# Patient Record
Sex: Female | Born: 1955 | Race: White | Hispanic: No | Marital: Married | Smoking: Current every day smoker
Health system: Southern US, Community
[De-identification: ages and names within clinical notes are randomized; demographics above are authoritative.]

## PROBLEM LIST (undated history)

## (undated) DIAGNOSIS — Z923 Personal history of irradiation: Secondary | ICD-10-CM

## (undated) DIAGNOSIS — Z808 Family history of malignant neoplasm of other organs or systems: Secondary | ICD-10-CM

## (undated) DIAGNOSIS — C801 Malignant (primary) neoplasm, unspecified: Secondary | ICD-10-CM

## (undated) DIAGNOSIS — I480 Paroxysmal atrial fibrillation: Secondary | ICD-10-CM

## (undated) DIAGNOSIS — M1990 Unspecified osteoarthritis, unspecified site: Secondary | ICD-10-CM

## (undated) DIAGNOSIS — C50919 Malignant neoplasm of unspecified site of unspecified female breast: Secondary | ICD-10-CM

## (undated) DIAGNOSIS — Z801 Family history of malignant neoplasm of trachea, bronchus and lung: Secondary | ICD-10-CM

## (undated) DIAGNOSIS — I10 Essential (primary) hypertension: Secondary | ICD-10-CM

## (undated) DIAGNOSIS — Z87442 Personal history of urinary calculi: Secondary | ICD-10-CM

## (undated) DIAGNOSIS — Z803 Family history of malignant neoplasm of breast: Secondary | ICD-10-CM

## (undated) HISTORY — DX: Family history of malignant neoplasm of other organs or systems: Z80.8

## (undated) HISTORY — PX: COLONOSCOPY: SHX174

## (undated) HISTORY — PX: BREAST EXCISIONAL BIOPSY: SUR124

## (undated) HISTORY — DX: Family history of malignant neoplasm of trachea, bronchus and lung: Z80.1

## (undated) HISTORY — PX: BREAST BIOPSY: SHX20

## (undated) HISTORY — PX: TOE SURGERY: SHX1073

## (undated) HISTORY — PX: HEMORRHOID SURGERY: SHX153

## (undated) HISTORY — DX: Family history of malignant neoplasm of breast: Z80.3

---

## 1998-02-06 ENCOUNTER — Inpatient Hospital Stay (HOSPITAL_COMMUNITY): Admission: AD | Admit: 1998-02-06 | Discharge: 1998-02-08 | Payer: Self-pay | Admitting: Obstetrics and Gynecology

## 1998-02-10 ENCOUNTER — Encounter: Admission: RE | Admit: 1998-02-10 | Discharge: 1998-05-11 | Payer: Self-pay | Admitting: Obstetrics and Gynecology

## 2001-04-25 ENCOUNTER — Other Ambulatory Visit: Admission: RE | Admit: 2001-04-25 | Discharge: 2001-04-25 | Payer: Self-pay | Admitting: Obstetrics and Gynecology

## 2001-11-08 ENCOUNTER — Ambulatory Visit (HOSPITAL_COMMUNITY): Admission: RE | Admit: 2001-11-08 | Discharge: 2001-11-08 | Payer: Self-pay | Admitting: General Surgery

## 2001-11-08 ENCOUNTER — Encounter: Payer: Self-pay | Admitting: General Surgery

## 2001-11-08 ENCOUNTER — Encounter (INDEPENDENT_AMBULATORY_CARE_PROVIDER_SITE_OTHER): Payer: Self-pay | Admitting: Specialist

## 2002-05-29 ENCOUNTER — Other Ambulatory Visit: Admission: RE | Admit: 2002-05-29 | Discharge: 2002-05-29 | Payer: Self-pay | Admitting: Obstetrics and Gynecology

## 2002-09-13 ENCOUNTER — Encounter: Payer: Self-pay | Admitting: Obstetrics and Gynecology

## 2002-09-13 ENCOUNTER — Encounter: Admission: RE | Admit: 2002-09-13 | Discharge: 2002-09-13 | Payer: Self-pay | Admitting: Obstetrics and Gynecology

## 2002-11-01 ENCOUNTER — Encounter (INDEPENDENT_AMBULATORY_CARE_PROVIDER_SITE_OTHER): Payer: Self-pay | Admitting: Specialist

## 2002-11-01 ENCOUNTER — Ambulatory Visit (HOSPITAL_COMMUNITY): Admission: RE | Admit: 2002-11-01 | Discharge: 2002-11-01 | Payer: Self-pay | Admitting: Obstetrics and Gynecology

## 2003-06-11 ENCOUNTER — Other Ambulatory Visit: Admission: RE | Admit: 2003-06-11 | Discharge: 2003-06-11 | Payer: Self-pay | Admitting: Obstetrics and Gynecology

## 2004-02-27 ENCOUNTER — Encounter: Admission: RE | Admit: 2004-02-27 | Discharge: 2004-02-27 | Payer: Self-pay | Admitting: Obstetrics and Gynecology

## 2004-07-19 ENCOUNTER — Other Ambulatory Visit: Admission: RE | Admit: 2004-07-19 | Discharge: 2004-07-19 | Payer: Self-pay | Admitting: Obstetrics and Gynecology

## 2004-10-26 ENCOUNTER — Ambulatory Visit: Payer: Self-pay | Admitting: Internal Medicine

## 2005-01-31 ENCOUNTER — Ambulatory Visit: Payer: Self-pay | Admitting: Internal Medicine

## 2005-03-25 ENCOUNTER — Encounter: Admission: RE | Admit: 2005-03-25 | Discharge: 2005-03-25 | Payer: Self-pay | Admitting: Obstetrics and Gynecology

## 2005-08-08 ENCOUNTER — Ambulatory Visit: Payer: Self-pay | Admitting: Internal Medicine

## 2005-11-04 ENCOUNTER — Ambulatory Visit: Payer: Self-pay | Admitting: Internal Medicine

## 2006-04-05 ENCOUNTER — Encounter: Admission: RE | Admit: 2006-04-05 | Discharge: 2006-04-05 | Payer: Self-pay | Admitting: Obstetrics and Gynecology

## 2006-07-11 ENCOUNTER — Inpatient Hospital Stay (HOSPITAL_COMMUNITY): Admission: EM | Admit: 2006-07-11 | Discharge: 2006-07-14 | Payer: Self-pay | Admitting: Emergency Medicine

## 2006-08-15 ENCOUNTER — Other Ambulatory Visit: Admission: RE | Admit: 2006-08-15 | Discharge: 2006-08-15 | Payer: Self-pay | Admitting: Obstetrics and Gynecology

## 2008-06-13 ENCOUNTER — Encounter: Admission: RE | Admit: 2008-06-13 | Discharge: 2008-06-13 | Payer: Self-pay | Admitting: Obstetrics and Gynecology

## 2009-09-18 ENCOUNTER — Encounter: Admission: RE | Admit: 2009-09-18 | Discharge: 2009-09-18 | Payer: Self-pay | Admitting: Obstetrics and Gynecology

## 2010-09-24 ENCOUNTER — Encounter: Admission: RE | Admit: 2010-09-24 | Discharge: 2010-09-24 | Payer: Self-pay | Admitting: Obstetrics and Gynecology

## 2011-05-13 NOTE — Op Note (Signed)
[facility]  Patient:    Caroline [name], Caroline [name] Visit Number: [id number] MRN: [id number]          Service Type: DSU Location: DAY Attending Physician:  Carson Myrtle Dictated by:   Sheppard Plumber Earlene Plater, M.D. Proc. Date: 11/08/01 Admit Date:  11/08/2001                             Operative Report  PREOPERATIVE DIAGNOSIS:  External hemorrhoids.  POSTOPERATIVE DIAGNOSIS:  External hemorrhoids.  OPERATION:  Complete external hemorrhoidectomy.  SURGEON:  Timothy E. Earlene Plater, M.D.  ANESTHESIA:  Local standby.  HISTORY:  Mrs. Civil has no GI problems, otherwise healthy but very large external hemorrhoids and because of the pain, irritation and occasional bleeding she wishes to have them removed.  This has been carefully explained.  DESCRIPTION OF PROCEDURE:  The patient was brought to the operating room and placed supine.  Monitored anesthesia was initiated.  She was placed in lithotomy.  Anal area inspected and prepped and draped in the usual fashion. Marcaine 0.25% with epinephrine mixed 9:1 with Wydase was injected around and about the anal orifice and massaged in well.  The four hemorrhoids were noted, right anterior, right posterior, left lateral and a small left anterior.  Each of these was individually excised.  Care taken to preserve normal skin. Underlying varicosities removed and then each separately closed with interrupted 3-0 chromic sutures.  The end results were very satisfactory.  The sphincters of course were intact and the internal anal orifice was completely normal.  Gelfoam gauze and a dry sterile dressing applied.  She tolerated it well and was awakened and taken to the recovery room in good condition.  Written and verbal instructions were given including Percocet and she will be seen and followed as an outpatient. Dictated by:   Sheppard Plumber Earlene Plater, M.D. Attending Physician:  Carson Myrtle DD:  11/08/01 TD:  11/08/01 Job:  [id number]

## 2011-05-13 NOTE — Op Note (Signed)
NAME:  Caroline [name], Caroline [name]                        ACCOUNT NO.:  [id number]   MEDICAL RECORD NO.:  [id number]                   PATIENT TYPE:  AMB   LOCATION:  SDC                                  FACILITY:  [facility]   PHYSICIAN:  Hal Morales, M.D.             DATE OF BIRTH:  20-Dec-1956   DATE OF PROCEDURE:  11/01/2002  DATE OF DISCHARGE:                                 OPERATIVE REPORT   PREOPERATIVE DIAGNOSES:  1. Pelvic pain.  2. Dysmenorrhea.   POSTOPERATIVE DIAGNOSES:  1. Pelvic pain.  2. Dysmenorrhea.  3. Lysis of adhesions.  4. Rule out endometriosis.   OPERATIONS:  1. Operative laparoscopy.  2. Lysis of adhesions.  3. Peritoneal and uterine biopsies.   ANESTHESIA:  General orotracheal.   ESTIMATED BLOOD LOSS:  Less than 10 cc.   COMPLICATIONS:  None.   FINDINGS:  The uterus was normal sized with several blotchy areas that were  whitened along the serosa of the fundus.  The left fallopian tube and left  ovary were within normal limits without adhesions or stigmata of  endometriosis.  The right fallopian tube contained some filmy adhesions to  the right ovary.  No clear stigmata of endometriosis could be appreciated.  There was a single adhesions between the omentum and the anterior abdominal  wall just above the symphysis pubis.  This was deviated to the left side.  There was a single pinpoint area of bluish tint that could be consistent  with a powder-burn lesion of endometriosis in the posterior cul-de-sac near  the right uterosacral ligament.  The appendix appeared normal.   PROCEDURE:  The patient was taken to the operating room after appropriate  identification and placed on the operating table.  After the attainment of  adequate general anesthesia, she was placed in the modified lithotomy  position.  The abdomen, perineum, and vagina were prepped with multiple  layers of Betadine.  A Foley catheter was inserted into the bladder and  connected to straight  drainage.  A Hulka tenaculum was placed on the  anterior cervix.  The abdomen was draped as a sterile field.  Subumbilical  and suprapubic injections of 0.25% Marcaine for a total of 10 cc was  undertaken.  A subumbilical incision was made and the Veress cannula placed  through that incision into the peritoneal cavity.  A pneumoperitoneum was  created with 3.5 liters of CO2.  The Veress cannula was removed and the  laparoscopic trocar placed through the subumbilical incision into the  peritoneal cavity.  The laparoscope was placed through the trocar sleeve and  the above-noted findings made and documented.  Lysis of adhesions was  carried out around the right tube and ovary to free that ovary and free the  fimbriated end of the tube.  Biopsies were obtained from the right  uterosacral ligament lesion and from two of the lesions on the uterine  fundus, all  sent to rule out endometriosis.  Lysis of adhesions was carried  out along the anterior abdominal wall with a combination of bipolar cautery  and sharp dissection.  Copious irrigation was carried out and hemostasis  noted to be adequate after cauterization of the biopsy site on the uterus.  Approximately 100 cc of warm lactated Ringers' was left in the peritoneal  cavity and all instruments were removed from the peritoneal cavity under  direct visualization as the CO2 was allowed to escape.  The subumbilical and  suprapubic incisions were closed with subcuticular sutures of 3-0 Vicryl and  sterile dressings applied.  The Hulka tenaculum and the Foley were removed  and the patient awakened from general anesthesia and taken to the recovery  room in satisfactory condition having tolerated the procedure well with  sponge and instrument counts correct.  Specimens to pathology, peritoneal  biopsy and uterine serosal biopsy.                                                Hal Morales, M.D.    VPH/MEDQ  D:  11/01/2002  T:  11/02/2002   Job:  [id number]

## 2011-05-13 NOTE — H&P (Signed)
[name], [name] NO.:  [id number]   MEDICAL RECORD NO.:  [id number]          PATIENT TYPE:  INP   LOCATION:  0104                         FACILITY:  [facility]   PHYSICIAN:  Lonia Blood, M.D.       DATE OF BIRTH:  23-Apr-1956   DATE OF ADMISSION:  07/11/2006  DATE OF DISCHARGE:                                HISTORY & PHYSICAL   PRIMARY CARE PHYSICIAN:  At the [facility].   CHIEF COMPLAINT:  Headache and neck pain.   HISTORY OF PRESENT ILLNESS:  .  Caroline [name] is a 55 year old woman without any significant past medical  history, who presented to [facility] Emergency Room with complaints of  headache, fever and neck pain.  The patient noticed that she woke up this  morning and she felt kind of lousy with significant headache and neck  stiffness.  The symptoms got worse during the day; she went to an urgent  care center and she was referred to the emergency room for possible  meningitis.  The patient reports that 24 hours prior to admission she felt  great.  She has no history of nausea, vomiting, diarrhea or any sick  contacts at home.   PAST MEDICAL HISTORY:  1.  The patient underwent 3 prior cesarean sections, in 1994, 1997 and 1999.  2.  History of antiphospholipid antibody positive.  3.  History of left breast nodule.  4.  History of herpes simplex virus II.  5.  History of  postpartum depression.  6.  History of intrauterine fetal demise at term.   MEDICATIONS:  None.   SOCIAL HISTORY:  The patient smokes a pack of cigarettes a day.  She is  married.  She does not drink alcohol.  She works as a Best boy at  [employer].  The patient has 2 living children.   ALLERGIES:  The patient is allergic to SULFA and NONSTEROIDAL ANTI-  INFLAMMATORY DRUGS.   FAMILY HISTORY:  Positive for mother, who died of emphysema.  She has an  aunt that had breast cancer and a cousin that had breast cancer.   REVIEW OF SYSTEMS:  As per  HPI.  Also, the patient reports a rash on the  right buttock.   PHYSICAL EXAMINATION:  VITAL SIGNS:  Temperature of 101.4, blood pressure  148/97, pulse 90, respirations 20, saturation 98% on room air.  GENERAL APPEARANCE:  A well-developed, well-nourished woman in no acute  distress, sitting on the stretcher, alert and oriented to place, person and  time.  HEENT:  Head is normocephalic, atraumatic.  Eyes:  The pupils are equal,  round and reactive to light and accommodation.  Extraocular movements  intact.  CHEST:  Clear to auscultation bilaterally without wheezes, rhonchi or  crackles.  HEART:  Regular rate and rhythm without murmurs, rubs, or gallops.  ABDOMEN:  Soft, nontender and non-distended.  Bowel sounds are present.  EXTREMITIES:  Lower extremities have no edema.  SKIN:  Warm and dry.  There is a tiny rash on the right buttock.  NECK:  The patient has very  slight nuchal rigidity.  NEUROLOGICAL:  Cranial nerves III-XII are intact.  Strength is 5/5 in all 4  extremities.  Sensation is intact.   LABORATORY VALUES:  At time of admission, white blood cell count is 9000,  hemoglobin 14.3, platelet count 200,000; sodium 134, potassium 3.9, chloride  100, bicarb 27, BUN 15, creatinine 0.7, calcium 8.8; urinalysis within  normal limits.  Cerebrospinal fluid shows 150 white blood cells, predominant  lymphocytes, 89%, with a glucose level in the CSF of 55 and a protein level  in the CSF of 96.   ASSESSMENT AND PLAN:  Meningitis, most likely aseptic.  We will admit the  patient to the hospital for further testing and observation.  The patient  will have her cerebrospinal fluid sent for herpes simplex.  She will have  antibodies for [facility] spotted fever tested and she will have the  cerebrospinal fluid sent for culture.  The patient  will be placed on  antibiotics including vancomycin, Rocephin, doxycycline and acyclovir.  If  the cerebrospinal fluid culture is negative for 24  hours, we will  discontinue the Rocephin and vancomycin.  Also, if the rash on the skin does  not expand, we will discontinue the doxycycline.  We will also obtain an  Infectious Disease consultation in the morning.      Lonia Blood, M.D.  Electronically Signed     SL/MEDQ  D:  07/11/2006  T:  07/11/2006  Job:  [id number]

## 2011-05-13 NOTE — Discharge Summary (Signed)
NAMEJEIRY, BIRNBAUM NO.:  [id number]   MEDICAL RECORD NO.:  [id number]          PATIENT TYPE:  INP   LOCATION:  1618                         FAC[facility]   PHYSICIAN:  Elliot Cousin, M.D.    DATE OF BIRTH:  06/25/1956   DATE OF ADMISSION:  07/11/2006  DATE OF DISCHARGE:  07/14/2006                                 DISCHARGE SUMMARY   DISCHARGE DIAGNOSES:  1.  Herpes simplex meningitis.  2.  History of genital herpes.  3.  Significant nausea and vomiting during the hospital course secondary to      multiple medications.  4.  Tobacco abuse.   For secondary discharge diagnoses, please see the dictated history and  physical.   DISCHARGE MEDICATIONS:  1.  Vicodin 5 mg 1-2 tablets every 4 hours as needed for pain.  2.  Valtrex 500 mg 2 tablets t.i.d. for 7 more days.  3.  Phenergan 25 mg every 6 hours as needed for nausea.  4.  Advil 400 mg every 4 hours as needed and as directed.  5.  Tylenol take as directed and as needed.   DISCHARGE DISPOSITION:  The patient was discharged to home in improved and  stable condition on July20,2007.  She was advised to follow up with her  primary care physician at [facility] in 5-7 days.  The patient  was strongly advised to come back to the hospital if her symptoms worsen.   PROCEDURE PERFORMED:  Lumbar puncture (prior to official hospital admission)  .   HISTORY OF PRESENT ILLNESS:  The patient is a 55 year old lady with a past  medical history significant for genital herpes,  antiphospholipid antibody  positive, and postpartum depression , who presented to the emergency  department on July16,2007 with a chief complaint of headache, fever, and  neck pain.  The patient went to a local urgent care center for initial  evaluation.  She was advised to go to the emergency department at [facility] for evaluation of possible meningitis.  When the patient  presented to the emergency, she was febrile  with a temperature of 101.4. A  lumbar puncture was performed during the evaluation in the emergency  department.  The results were consistent with a viral or aseptic meningitis.  The patient was subsequently admitted for further evaluation and management.   HOSPITAL COURSE.:  #1.  HERPES SIMPLEX MENINGITIS. As stated above, the  patient was febrile with a temperature of 101.4.  She was hemodynamically  stable.  Her white blood cell count was within normal limits at 9.2.  The  results of the lumbar puncture revealed a total WBC count of 150 and RBC  count of 19.  There were 5% segmented neutrophils, 89% lymphocytes, and 5%  monocytes.  The Gram's stain revealed moderate WBCs , but no organism seen.  Blood cultures were ordered.  The patient was started on empiric treatment  with acyclovir, Rocephin, vancomycin, and doxycycline, all intravenously.  The patient did have a small rash on her right buttock that upon  reevaluation, appeared to be an excoriated pimple.  Doxycycline however was  started empirically.   The patient reported a history of genital herpes and therefore herpes  simplex meningitis was a concern. Intravenous acyclovir was started.  Over  the course of the hospitalization, the cerebrospinal fluid culture remained  negative.  On the day of anticipated hospital discharge, the CSF PCR became  positive for herpes simplex.  The patient did receive intravenous acyclovir  for 1 day.  However, it was discontinued afterwards because of severe nausea  and vomiting.   Given the findings of the positive herpes simplex, I requested a curbside  consult with Dr. Cliffton Asters, infectious diseases specialist.  The  question was whether or not the patient needed to stay in the hospital for  further treatment with intravenous acyclovir.  Dr. Orvan Falconer was informed  that the patient wanted to go home, her headache had subsided, and she  demonstrated no signs or symptoms consistent with  encephalitis.  Given this  information, Dr. Orvan Falconer recommended discharging the patient on high dose  Valtrex for 5-10 more days.  Therefore the patient was discharged to home on  Valtrex 1000 mg t.i.d. for 7 more days.  She was also sent home on Phenergan  as needed for nausea and Vicodin as needed for pain.  The patient's nausea  and vomiting completely resolved during the hospital course.  Her headache  subsided substantially although she still had residual frontal symptoms. The  photophobia subsided as well.   Of note, several other studies were ordered for further evaluation.  The  results are as follows: HIV antibody was nonreactive, cryptococcal antigen  was negative, and the [facility] spotted fever titer was negative as  well.  The patient's blood cultures also remained negative during the  hospitalization.   #2.  HEADACHE . The patients headache subsided, but did not completely  resolve during the hospitalization.  The headache was exacerbated by  caffeine withdrawal and medications.  Once the adjustments in medications  were made, the patients headache subsided.  Of note, she was given a small  amount of caffeinated coffee towards the end of the hospitalization which  appeared to have helped.   #3.  TOBACCO ABUSE. The patient was advised to stop smoking.  She was  treated with a nicotine patch 21 mg daily.      Elliot Cousin, M.D.  Electronically Signed     DF/MEDQ  D:  07/17/2006  T:  07/18/2006  Job:  [id number]

## 2011-05-13 NOTE — H&P (Signed)
NAME:  Caroline [name], Caroline [name] NO.:  [id number]   MEDICAL RECORD NO.:  [id number]                   PATIENT TYPE:  AMB   LOCATION:  SDC                                  FACILITY:  [facility]   PHYSICIAN:  Hal Morales, M.D.             DATE OF BIRTH:  20-Nov-1956   DATE OF ADMISSION:  DATE OF DISCHARGE:                                HISTORY & PHYSICAL   HISTORY OF PRESENT ILLNESS:  The patient is a 55 year old, married, white  female, para 3-0-0-2, who presents for a diagnostic laparoscopy due to  severe dysmenorrhea.  The patient states that for the past year she has  experienced severe menstrual cramping which began one day prior to her  menstrual period and lasts at least two days into her four-day menstrual  flow.  The patient rates her pain as a 9/10 and though she has tried  nonsteroidal anti-inflammatory medications, they cause such severe GI upset  she is unable to tolerate them.  As a result, the patient has to take two  Vicodin to decrease her pain to 3/10 on a 10 point scale.  The patient's  flow is such that it requires her to change a super tampon plus a pad every  one-and-a-half hours for the first two days of her menstrual flow.  The  remainder of her menses is characterized by simply wearing a regular tampon  which she changes every five to six hours.  She admits to diarrhea with her  menses, but denies any nausea, vomiting, fever, or dyspareunia.   PAST OBSTETRICAL HISTORY:  Gravida 3, para 3-0-0-2.  The patient underwent  cesarean sections in 1994, 1997, and 1999.  She experienced postpartum  depression and intrauterine fetal demise at term.   PAST GYNECOLOGICAL HISTORY:  Menarche at 55 years old.  The patient's  menstrual periods are regular, every 25 days.  She uses vasectomy as her  method of contraception.  She has a remote history of chlamydia, as well as  abnormal Pap smear.  She had a normal DEXA scan in May of 2002, a normal Pap  smear in June of 2003, and a normal mammogram in September of 2003.   PAST MEDICAL HISTORY:  Positive for:  1. Herpes simplex virus #2.  2. Left breast nodule (benign).  3. Thrombocytopenia (postpartum).  4. Anticardiolipin antibody positive.   PAST SURGICAL HISTORY:  Positive for:  1. Cesarean sections as previously mentioned.  2. Lasik surgery of her eyes in 2000.  3. Hemorrhoidectomy in 2001.   She denies any problems with anesthesia or history of blood transfusions.   FAMILY HISTORY:  Positive for stroke, emphysema, lung cancer, and breast  cancer.   SOCIAL HISTORY:  The patient is married.  She works at [employer].   CURRENT MEDICATIONS:  Vicodin.   ALLERGIES:  SULFA which causes swelling and NONSTEROIDAL ANTI-INFLAMMATORY  MEDICATIONS which cause severe gastric distress.  HABITS:  The patient does smoke a half of a packs of cigarettes per day.  She consumes alcohol at least twice weekly.   REVIEW OF SYMPTOMS:  Positive for knee arthralgias, however, otherwise  negative, except as mentioned in the history of present illness.   PHYSICAL EXAMINATION:  VITAL SIGNS:  The blood pressure is 100/80.  WEIGHT:  154 pounds.  HEIGHT:  5 feet 4-1/8 inches tall.  NECK:  Supple without masses.  There is no adenopathy or thyromegaly.  HEART:  Regular rate and rhythm.  There is no murmur.  LUNGS:  Clear to auscultation.  There are no wheezing, rhonchi, or rales.  BACK:  Without CVA tenderness.  ABDOMEN:  Bowel sounds are present.  It is soft without tenderness,  guarding, or organomegaly.  There are no palpable masses.  EXTREMITIES:  No cyanosis, clubbing, or edema.  PELVIC:  EG and BUS within normal limits.  The vagina is rugous.  The cervix  is nontender without lesions.  The uterus appears normal size, shape, and  consistency without tenderness.  Adnexa without tenderness or masses.  Rectovaginal without tenderness or masses.   IMPRESSION:  Severe dysmenorrhea.    DISPOSITION:  The patient understands the implications for her laparoscopic  procedure along with the risks associated with it to include, but are not  limited to reaction to anesthesia, damage to adjacent organs, infections,  and bleeding.  The patient has consented to undergo a diagnostic laparoscopy  at [facility] on November 01, 2002, at 11:45 a.m.     [place] J. Adline Peals.                    Hal Morales, M.D.    EJP/MEDQ  D:  10/23/2002  T:  10/23/2002  Job:  [id number]

## 2011-09-01 ENCOUNTER — Other Ambulatory Visit: Payer: Self-pay | Admitting: Obstetrics and Gynecology

## 2011-09-01 DIAGNOSIS — Z1231 Encounter for screening mammogram for malignant neoplasm of breast: Secondary | ICD-10-CM

## 2011-10-10 ENCOUNTER — Ambulatory Visit: Payer: Self-pay

## 2011-10-28 ENCOUNTER — Ambulatory Visit: Payer: Self-pay

## 2011-11-25 ENCOUNTER — Ambulatory Visit
Admission: RE | Admit: 2011-11-25 | Discharge: 2011-11-25 | Disposition: A | Discharge status: Home / Self Care | Payer: BC Managed Care – PPO | Source: Ambulatory Visit | Attending: Obstetrics and Gynecology | Admitting: Obstetrics and Gynecology

## 2011-11-25 DIAGNOSIS — Z1231 Encounter for screening mammogram for malignant neoplasm of breast: Secondary | ICD-10-CM

## 2011-11-26 ENCOUNTER — Inpatient Hospital Stay (HOSPITAL_COMMUNITY)
Admission: EM | Admit: 2011-11-26 | Discharge: 2011-11-27 | DRG: 143 | Disposition: A | Discharge status: Home / Self Care | Payer: BC Managed Care – PPO | Attending: Family Medicine | Admitting: Family Medicine

## 2011-11-26 ENCOUNTER — Emergency Department (HOSPITAL_COMMUNITY): Payer: BC Managed Care – PPO

## 2011-11-26 ENCOUNTER — Encounter: Payer: Self-pay | Admitting: Nurse Practitioner

## 2011-11-26 DIAGNOSIS — R079 Chest pain, unspecified: Principal | ICD-10-CM | POA: Diagnosis present

## 2011-11-26 DIAGNOSIS — Z720 Tobacco use: Secondary | ICD-10-CM

## 2011-11-26 DIAGNOSIS — I10 Essential (primary) hypertension: Secondary | ICD-10-CM

## 2011-11-26 HISTORY — DX: Essential (primary) hypertension: I10

## 2011-11-26 LAB — CBC
HCT: 39.9 % (ref 36.0–46.0)
HCT: 39.7 % (ref 36.0–46.0)
Hemoglobin: 14.1 g/dL (ref 12.0–15.0)
Hemoglobin: 13.8 g/dL (ref 12.0–15.0)
MCH: 32.9 pg (ref 26.0–34.0)
MCHC: 35.3 g/dL (ref 30.0–36.0)
MCHC: 34.8 g/dL (ref 30.0–36.0)
MCV: 93.2 fL (ref 78.0–100.0)
Platelets: 160 10*3/uL (ref 150–400)
RBC: 4.28 MIL/uL (ref 3.87–5.11)
RBC: 4.21 MIL/uL (ref 3.87–5.11)
RDW: 13.5 % (ref 11.5–15.5)
WBC: 6.5 10*3/uL (ref 4.0–10.5)
WBC: 6.3 10*3/uL (ref 4.0–10.5)

## 2011-11-26 LAB — BASIC METABOLIC PANEL
BUN: 17 mg/dL (ref 6–23)
CO2: 27 mEq/L (ref 19–32)
Calcium: 9.1 mg/dL (ref 8.4–10.5)
Chloride: 101 mEq/L (ref 96–112)
Creatinine, Ser: 0.63 mg/dL (ref 0.50–1.10)
GFR calc Af Amer: >90 mL/min (ref >90)
GFR calc non Af Amer: >90 mL/min (ref >90)
Glucose, Bld: 102 mg/dL — ABNORMAL HIGH (ref 70–99)
Potassium: 4 mEq/L (ref 3.5–5.1)
Sodium: 140 mEq/L (ref 135–145)

## 2011-11-26 LAB — CREATININE, SERUM
Creatinine, Ser: 0.75 mg/dL (ref 0.50–1.10)
GFR calc Af Amer: >90 mL/min (ref >90)
GFR calc non Af Amer: >90 mL/min (ref >90)

## 2011-11-26 LAB — CARDIAC PANEL(CRET KIN+CKTOT+MB+TROPI)
CK, MB: 1.9 ng/mL (ref 0.3–4.0)
Relative Index: INVALID (ref 0.0–2.5)
Troponin I: <0.3 ng/mL (ref <0.30)

## 2011-11-26 LAB — TROPONIN I: Troponin I: <0.3 ng/mL (ref <0.30)

## 2011-11-26 MED ORDER — ZOLPIDEM TARTRATE 5 MG PO TABS
5.0000 mg | ORAL_TABLET | Freq: Every evening | ORAL | Status: DC | PRN
  Administered 2011-11-26: 5 mg via ORAL
  Filled 2011-11-26: qty 1

## 2011-11-26 MED ORDER — MORPHINE SULFATE 4 MG/ML IJ SOLN
6.0000 mg | Freq: Once | INTRAMUSCULAR | Status: AC
  Administered 2011-11-26: 6 mg via INTRAVENOUS
  Filled 2011-11-26: qty 2

## 2011-11-26 MED ORDER — METOPROLOL TARTRATE 12.5 MG HALF TABLET
12.5000 mg | ORAL_TABLET | Freq: Two times a day (BID) | ORAL | Status: DC
  Administered 2011-11-26 – 2011-11-27 (×2): 12.5 mg via ORAL
  Filled 2011-11-26 (×3): qty 1

## 2011-11-26 MED ORDER — TRAMADOL HCL 50 MG PO TABS
50.0000 mg | ORAL_TABLET | Freq: Four times a day (QID) | ORAL | Status: DC | PRN
  Administered 2011-11-26: 50 mg via ORAL
  Filled 2011-11-26: qty 1

## 2011-11-26 MED ORDER — ACETAMINOPHEN 650 MG RE SUPP: 650.0000 mg | Freq: Four times a day (QID) | RECTAL | Status: DC | PRN

## 2011-11-26 MED ORDER — ONDANSETRON HCL 4 MG/2ML IJ SOLN
4.0000 mg | Freq: Once | INTRAMUSCULAR | Status: AC
  Administered 2011-11-26: 4 mg via INTRAVENOUS
  Filled 2011-11-26: qty 2

## 2011-11-26 MED ORDER — ONDANSETRON HCL 4 MG/2ML IJ SOLN: 4.0000 mg | Freq: Three times a day (TID) | INTRAMUSCULAR | Status: AC | PRN

## 2011-11-26 MED ORDER — ACETAMINOPHEN 325 MG PO TABS
650.0000 mg | ORAL_TABLET | Freq: Once | ORAL | Status: AC
  Administered 2011-11-26: 650 mg via ORAL
  Filled 2011-11-26: qty 1

## 2011-11-26 MED ORDER — ACETAMINOPHEN 325 MG PO TABS: 650.0000 mg | ORAL_TABLET | Freq: Four times a day (QID) | ORAL | Status: DC | PRN

## 2011-11-26 MED ORDER — ASPIRIN 81 MG PO CHEW: 324.0000 mg | CHEWABLE_TABLET | Freq: Once | ORAL | Status: DC

## 2011-11-26 MED ORDER — LISINOPRIL 20 MG PO TABS
20.0000 mg | ORAL_TABLET | Freq: Every day | ORAL | Status: DC
  Administered 2011-11-26: 20 mg via ORAL
  Filled 2011-11-26 (×2): qty 1

## 2011-11-26 MED ORDER — NICOTINE 14 MG/24HR TD PT24
14.0000 mg | MEDICATED_PATCH | Freq: Every day | TRANSDERMAL | Status: DC
  Administered 2011-11-26 – 2011-11-27 (×2): 14 mg via TRANSDERMAL
  Filled 2011-11-26 (×3): qty 1

## 2011-11-26 MED ORDER — PNEUMOCOCCAL VAC POLYVALENT 25 MCG/0.5ML IJ INJ
0.5000 mL | INJECTION | INTRAMUSCULAR | Status: AC
  Administered 2011-11-27: 0.5 mL via INTRAMUSCULAR
  Filled 2011-11-26: qty 0.5

## 2011-11-26 MED ORDER — IBUPROFEN 200 MG PO TABS: 400.0000 mg | ORAL_TABLET | Freq: Once | ORAL | Status: DC

## 2011-11-26 MED ORDER — ENOXAPARIN SODIUM 40 MG/0.4ML ~~LOC~~ SOLN
40.0000 mg | SUBCUTANEOUS | Status: DC
  Administered 2011-11-26: 40 mg via SUBCUTANEOUS
  Filled 2011-11-26 (×2): qty 0.4

## 2011-11-26 MED ORDER — ASPIRIN 81 MG PO CHEW
81.0000 mg | CHEWABLE_TABLET | Freq: Every day | ORAL | Status: DC
  Administered 2011-11-27: 81 mg via ORAL
  Filled 2011-11-26 (×2): qty 1

## 2011-11-26 MED ORDER — SODIUM CHLORIDE 0.9 % IV SOLN: INTRAVENOUS | Status: DC

## 2011-11-26 NOTE — ED Notes (Signed)
Called floor to give report, nurse currently unavailable, awaiting return call. Will attempt again in if no call.

## 2011-11-26 NOTE — ED Provider Notes (Cosign Needed)
History    55yf with CP. Episode yesterday which resolved. Similar one again today. Chest pressure with radiation to L arm. Associated with nausea. No fever or chills. No cp or sob. Denies leg pain or swelling. Denies hx of blood clot. No rash.  CSN: [id number] Arrival date & time: 11/26/2011 10:51 AM   First MD Initiated Contact with Patient 11/26/11 1054      Chief Complaint  Patient presents with  . Chest Pain    (Consider location/radiation/quality/duration/timing/severity/associated sxs/prior treatment) HPI  Past Medical History  Diagnosis Date  . Hypertension     History reviewed. No pertinent past surgical history.  History reviewed. No pertinent family history.  History  Substance Use Topics  . Smoking status: Current Everyday Smoker -- 1.0 packs/day  . Smokeless tobacco: Not on file  . Alcohol Use: Not on file    OB History    Grav Para Term Preterm Abortions TAB SAB Ect Mult Living                  Review of Systems   Review of symptoms negative unless otherwise noted in HPI.   Allergies  Review of patient's allergies indicates no known allergies.  Home Medications   Current Outpatient Rx  Name Route Sig Dispense Refill  . LISINOPRIL 20 MG PO TABS Oral Take 20 mg by mouth at bedtime.        BP 151/95  Pulse 86  Temp(Src) 98.2 F (36.8 C) (Oral)  Resp 20  Ht 5\' 4"  (1.626 m)  Wt 155 lb (70.308 kg)  BMI 26.61 kg/m2  SpO2 98%  Physical Exam  Nursing note and vitals reviewed. Constitutional: She appears well-developed and well-nourished. No distress.  HENT:  Head: Normocephalic and atraumatic.  Eyes: Conjunctivae are normal. Right eye exhibits no discharge. Left eye exhibits no discharge.  Neck: Neck supple.  Cardiovascular: Normal rate, regular rhythm and normal heart sounds.  Exam reveals no gallop and no friction rub.   No murmur heard. Pulmonary/Chest: Effort normal and breath sounds normal. No respiratory distress.  Abdominal: Soft.  She exhibits no distension. There is no tenderness.       Cp not reproducible  Musculoskeletal: She exhibits no edema and no tenderness.  Neurological: She is alert.  Skin: Skin is warm and dry.  Psychiatric: She has a normal mood and affect. Her behavior is normal. Thought content normal.    ED Course  Procedures (including critical care time)  Labs Reviewed  BASIC METABOLIC PANEL - Abnormal; Notable for the following:    Glucose, Bld 102 (*)    All other components within normal limits  COMPREHENSIVE METABOLIC PANEL - Abnormal; Notable for the following:    Total Protein 5.9 (*)    Albumin 3.0 (*)    All other components within normal limits  LIPID PANEL - Abnormal; Notable for the following:    Cholesterol 201 (*)    All other components within normal limits  CBC  TROPONIN I  CBC  CREATININE, SERUM  MAGNESIUM  PHOSPHORUS  CARDIAC PANEL(CRET KIN+CKTOT+MB+TROPI)  CARDIAC PANEL(CRET KIN+CKTOT+MB+TROPI)  CARDIAC PANEL(CRET KIN+CKTOT+MB+TROPI)  LAB REPORT - SCANNED   No results found.   1. Chest pain   2. Tobacco abuse   3. HTN (hypertension)     ED ECG REPORT   Date: 11/26/2011  EKG Time: 11:16 AM  Rate: 79  Rhythm: normal sinus rhythm  Axis: normal  Intervals:none  ST&T Change: NS ST changes  Narrative Interpretation: poor r wave  progression and NS ST flattening anteriorly.             MDM  55yF with CP. Initial EKG non diagnostic but with some concerning changes of twave flattening anteriorly and poor r wave progression. Unfortunately no comparison EKG. Do not feel pt is low risk. EKG changes, concerning story, risk factors of HTN, age and smoking. Admit for r/o and further eval.        Raeford Razor, MD 11/30/11 [phone]

## 2011-11-26 NOTE — ED Notes (Addendum)
Called the floor to give report and spoke with Pattricia Boss, Charity fundraiser.  Pt prepared for transport to the floor.  A/O x3, NAD.  Pt was medicated per order for headache.

## 2011-11-26 NOTE — ED Notes (Signed)
Per ems: pt called for [place], dizziness, numbness to L arm onset this am. Similar symptoms yesterday that resolved on their own. En route pt began to c/o chest pressure. ST on monitor. ems gave 3 SL nitro, 4mg  IVP zofran en route, relieved nausea for a brief time, pt initial bp decreased to 124/88 after nitro but pt now c/o headache.

## 2011-11-26 NOTE — ED Notes (Signed)
Patient reporting pain medication has worked and headache has decreased. Primary RN aware.

## 2011-11-26 NOTE — ED Notes (Signed)
Pt took 325 mg asa at home prior to arrival

## 2011-11-26 NOTE — H&P (Signed)
Caroline [name] is an 55 y.o. female.   Chief Complaint:  Chest pain on and off since yesterday HPI:  55 year old female, history of hypertension and ongoing tobacco abuse, presented to the ED for some onset chest pain once she was sitting on her computer, pain is tightness radiated to her left arm associated with numbness, and diaphoresis patient admitted yesterday had a similar episode, respond to aspirin and nitroglycerin, but currently she complain of headache from using the nitroglycerin. Patient admitted she had a similar episode on the past but never been investigated bed patient was seen in the emergency room her blood pressure systolic was191/64, patient denies any shortness of breath and denies any lower extremity swelling   Past Medical History  Diagnosis Date  . Hypertension     Bus surgical history : C-section   family history : COPD and stroke denies any history of heart attack in the family  Social History: She is married, has 2 grown children, she smoke secondary to one pack per day for more than 30 years She drinks alcohol occasionally, denies any illicit drug abuse  Allergies: No Known Allergies  Medications Prior to Admission  Medication Dose Route Frequency Provider Last Rate Last Dose  . acetaminophen (TYLENOL) tablet 650 mg  650 mg Oral Once Raeford Razor, MD   650 mg at 11/26/11 1559  . morphine 4 MG/ML injection 6 mg  6 mg Intravenous Once Raeford Razor, MD   6 mg at 11/26/11 1158  . ondansetron (ZOFRAN) injection 4 mg  4 mg Intravenous Once Raeford Razor, MD   4 mg at 11/26/11 1158  . DISCONTD: aspirin chewable tablet 324 mg  324 mg Oral Once Raeford Razor, MD      . DISCONTD: ibuprofen (ADVIL,MOTRIN) tablet 400 mg  400 mg Oral Once Raeford Razor, MD       No current outpatient prescriptions on file as of 11/26/2011.    Results for orders placed during the hospital encounter of 11/26/11 (from the past 48 hour(s))  CBC     Status: Normal   Collection Time   11/26/11 11:05 AM      Component Value Range Comment   WBC 6.5  4.0 - 10.5 (K/uL)    RBC 4.28  3.87 - 5.11 (MIL/uL)    Hemoglobin 14.1  12.0 - 15.0 (g/dL)    HCT 32.4  40.1 - 02.7 (%)    MCV 93.2  78.0 - 100.0 (fL)    MCH 32.9  26.0 - 34.0 (pg)    MCHC 35.3  30.0 - 36.0 (g/dL)    RDW 25.3  66.4 - 40.3 (%)    Platelets 160  150 - 400 (K/uL)   BASIC METABOLIC PANEL     Status: Abnormal   Collection Time   11/26/11 11:05 AM      Component Value Range Comment   Sodium 140  135 - 145 (mEq/L)    Potassium 4.0  3.5 - 5.1 (mEq/L)    Chloride 101  96 - 112 (mEq/L)    CO2 27  19 - 32 (mEq/L)    Glucose, Bld 102 (*) 70 - 99 (mg/dL)    BUN 17  6 - 23 (mg/dL)    Creatinine, Ser 4.74  0.50 - 1.10 (mg/dL)    Calcium 9.1  8.4 - 10.5 (mg/dL)    GFR calc non Af Amer >90  >90 (mL/min)    GFR calc Af Amer >90  >90 (mL/min)   TROPONIN I  Status: Normal   Collection Time   11/26/11 11:09 AM      Component Value Range Comment   Troponin I <0.30  <0.30 (ng/mL)    Dg Chest 2 View  11/26/2011  *RADIOLOGY REPORT*  Clinical Data: Chest pain  CHEST - 2 VIEW  Comparison: None.  Findings: The cardiac silhouette, mediastinum, pulmonary vasculature are within normal limits.  Both lungs are clear. There is no acute bony abnormality.  IMPRESSION: There is no evidence of acute cardiac or pulmonary process.  Original Report Authenticated By: Brandon Melnick, M.D.    Review of Systems  Constitutional: Positive for diaphoresis. Negative for fever, chills, weight loss and malaise/fatigue.  HENT: Negative for hearing loss, ear pain, nosebleeds, congestion, sore throat, neck pain, tinnitus and ear discharge.   Eyes: Negative for blurred vision, double vision, pain, discharge and redness.  Respiratory: Positive for sputum production. Negative for cough, hemoptysis and stridor.   Cardiovascular: Positive for chest pain and palpitations. Negative for orthopnea, claudication, leg swelling and PND.  Gastrointestinal:  Positive for nausea. Negative for heartburn, abdominal pain, diarrhea, constipation and blood in stool.  Genitourinary: Negative for dysuria, urgency, frequency and hematuria.  Musculoskeletal: Negative for myalgias, back pain and joint pain.  Skin: Negative for itching and rash.  Neurological: Negative for dizziness, tingling, sensory change, weakness and headaches.  Endo/Heme/Allergies: Negative for environmental allergies. Does not bruise/bleed easily.    Blood pressure 124/86, pulse 77, temperature 98.2 F (36.8 C), temperature source Oral, resp. rate 20, height 5\' 4"  (1.626 m), weight 71.5 kg (157 lb 10.1 oz), SpO2 93.00%. Physical Exam  Constitutional: She is oriented to person, place, and time. She appears well-developed and well-nourished. No distress.  HENT:  Head: Normocephalic and atraumatic.  Right Ear: External ear normal.  Left Ear: External ear normal.  Mouth/Throat: Oropharynx is clear and moist. No oropharyngeal exudate.  Eyes: Conjunctivae are normal. Pupils are equal, round, and reactive to light. Right eye exhibits no discharge. Left eye exhibits discharge.  Neck: Neck supple. No JVD present. No thyromegaly present.  Cardiovascular: Normal rate, normal heart sounds and intact distal pulses.  Exam reveals no gallop and no friction rub.   No murmur heard. Respiratory: No stridor. No respiratory distress. She has no wheezes. She has no rales. She exhibits no tenderness.  GI: She exhibits mass. She exhibits no distension. There is no tenderness. There is no rebound and no guarding.  Musculoskeletal: She exhibits no edema and no tenderness.  Lymphadenopathy:    She has no cervical adenopathy.  Neurological: She is alert and oriented to person, place, and time. She has normal reflexes. No cranial nerve deficit. Coordination normal.  Skin: Skin is warm. No rash noted. She is not diaphoretic. No erythema. No pallor.     Assessment/Plan 32-53 year old female presented with  chest pain, and un controlled hypertension. History of recurrent chest pain never been investigated, EKG so just felt to T-wave at V1 and V4 and diminished R-wave V3 . The patient would be admitted to telemetry, cycle cardiac enzymes, 2-D echo, aspirin 81 mg daily, metoprolol d, further investigation with a stress test or cardiology consultation in-house versus outpatient stress test as the patient felt pain free at this minute and patient would like to be discharged.lipid profile 2-hypertension the patient blood pressures was elevated I would continue lisinoprilr and add beta blocker  3- smoking counselling. Dinisha Cai I. 11/26/2011, 5:18 PM

## 2011-11-26 NOTE — ED Notes (Signed)
Pt report received and care assumed from [place], [place]. Pt sitting up in the bed watching TV.  NAD, pt denies CP/SOB and N/V.  Pt states "I just wanna go home." Pt does c/o headache returning at 6/10 as a result of NTG administration. Physician notified, new order received.

## 2011-11-27 LAB — COMPREHENSIVE METABOLIC PANEL
ALT: 16 U/L (ref 0–35)
AST: 17 U/L (ref 0–37)
Albumin: 3 g/dL — ABNORMAL LOW (ref 3.5–5.2)
Alkaline Phosphatase: 67 U/L (ref 39–117)
Chloride: 100 mEq/L (ref 96–112)
Potassium: 3.7 mEq/L (ref 3.5–5.1)
Sodium: 136 mEq/L (ref 135–145)
Total Bilirubin: 0.5 mg/dL (ref 0.3–1.2)
Total Protein: 5.9 g/dL — ABNORMAL LOW (ref 6.0–8.3)

## 2011-11-27 LAB — CARDIAC PANEL(CRET KIN+CKTOT+MB+TROPI)
CK, MB: 2.1 ng/mL (ref 0.3–4.0)
CK, MB: 2 ng/mL (ref 0.3–4.0)
Troponin I: <0.3 ng/mL (ref <0.30)
Troponin I: <0.3 ng/mL (ref <0.30)

## 2011-11-27 LAB — LIPID PANEL
Cholesterol: 201 mg/dL — ABNORMAL HIGH (ref 0–200)
HDL: 84 mg/dL (ref >39)
LDL Cholesterol: 98 mg/dL (ref 0–99)
Triglycerides: 97 mg/dL (ref <150)
VLDL: 19 mg/dL (ref 0–40)

## 2011-11-27 MED ORDER — NICOTINE 14 MG/24HR TD PT24: 1.0000 | MEDICATED_PATCH | TRANSDERMAL | Status: AC

## 2011-11-27 MED ORDER — ASPIRIN 81 MG PO CHEW: 81.0000 mg | CHEWABLE_TABLET | Freq: Every day | ORAL | Status: AC

## 2011-11-27 MED ORDER — LISINOPRIL 40 MG PO TABS: 40.0000 mg | ORAL_TABLET | Freq: Every day | ORAL | Status: DC

## 2011-11-27 NOTE — Progress Notes (Signed)
CSW attempted to visit with pt to discuss advance directives. CSW was informed that the pt had discharged.  Golden Pop 11/27/2011 3:38 PM

## 2011-11-27 NOTE — Discharge Summary (Signed)
Physician Discharge Summary  Caroline [name] ZOX:[id number] DOB: 1956-02-25 DOA: 11/26/2011  PCP: Eartha Inch, MD  Admit date: 11/26/2011 Discharge date: 11/27/2011  Discharge Diagnoses:  1. Chest pain 2. Uncontrolled hypertension  Discharge Condition: Improved  Disposition: Home  History of present illness:  55 year old woman who presented via EMS with complaint of nausea, dizziness, chest pressure and left arm numbness. Chest pressure began while she was working at her computer. Was noted be hypertensive in the emergency department. She was admitted for chest pain evaluation.  Hospital Course:  1. Chest pain: Resolved. Etiology most likely hypertension. She also has chronic neck problems which may have contributed to her left arm paresthesias. Her symptoms are completely resolved and she desires discharge home. She does not want any further cardiac evaluation.  2. Uncontrolled hypertension: Much improved. Will increase her lisinopril. She may need a second agent. I stressed that she needs to followup with her primary care physician soon.   Certainly she has recurrent symptoms she should seek immediate evaluation. However I doubt coronary disease at this point. She understands that coronary disease however cannot be excluded without further testing which she wishes to defer consideration of packed this at this time.  Consultants:  None.  Procedures:  None.  Discharge Instructions  Discharge Orders    Future Orders Please Complete By Expires   Diet - low sodium heart healthy      Discharge instructions      Comments:   Call physician for chest pain or shortness of breath received immediate evaluation at emergency department.   Activity as tolerated - No restrictions        Current Discharge Medication List    START taking these medications   Details  aspirin 81 MG chewable tablet Chew 1 tablet (81 mg total) by mouth daily.    nicotine (NICODERM CQ - DOSED IN MG/24  HOURS) 14 mg/24hr patch Place 1 patch onto the skin daily.      CONTINUE these medications which have CHANGED   Details  lisinopril (PRINIVIL,ZESTRIL) 40 MG tablet Take 1 tablet (40 mg total) by mouth at bedtime. Qty: 30 tablet, Refills: 0       Follow-up Information    Follow up with BADGER,MICHAEL C in 1 week.   Contact information:   [address] [place] [address] [phone]           The results of significant diagnostics from this hospitalization (including imaging, microbiology, ancillary and laboratory) are listed below for reference.    Significant Diagnostic Studies: EKG 11/26/2011 at 11:06 AM: Independently reviewed: Sinus rhythm. Nonspecific T-wave changes. No acute changes.  Dg Chest 2 View  11/26/2011  *RADIOLOGY REPORT*  Clinical Data: Chest pain  CHEST - 2 VIEW  Comparison: None.  Findings: The cardiac silhouette, mediastinum, pulmonary vasculature are within normal limits.  Both lungs are clear. There is no acute bony abnormality.  IMPRESSION: There is no evidence of acute cardiac or pulmonary process.  Original Report Authenticated By: Brandon Melnick, M.D.   Labs: Basic Metabolic Panel:  Lab 11/27/11 0865 11/26/11 1800 11/26/11 1105  NA 136 -- 140  K 3.7 -- 4.0  CL 100 -- 101  CO2 30 -- 27  GLUCOSE 95 -- 102*  BUN 19 -- 17  CREATININE 0.71 0.75 0.63  CALCIUM 8.4 -- 9.1  MG -- 2.0 --  PHOS 4.5 -- --   Liver Function Tests:  Lab 11/27/11 0500  AST 17  ALT 16  ALKPHOS 67  BILITOT 0.5  PROT 5.9*  ALBUMIN 3.0*   CBC:  Lab 11/26/11 1800 11/26/11 1105  WBC 6.3 6.5  NEUTROABS -- --  HGB 13.8 14.1  HCT 39.7 39.9  MCV 94.3 93.2  PLT 161 160   Cardiac Enzymes:  Lab 11/27/11 0904 11/27/11 0028 11/26/11 1800 11/26/11 1109  CKTOTAL 46 51 54 --  CKMB 2.0 2.1 1.9 --  CKMBINDEX -- -- -- --  TROPONINI <0.30 <0.30 <0.30 <0.30   Time coordinating discharge: 25 minutes.  Signed:  Brendia Sacks, MD  [facility] 11/27/2011, 2:39 PM

## 2011-11-27 NOTE — Progress Notes (Signed)
PROGRESS NOTE  Caroline [name] UJW:[id number] DOB: December 19, 1956 DOA: 11/26/2011 PCP: Provider Not In System  Brief narrative: 55 year old woman who presented via EMS with complaint of nausea, dizziness, chest pressure and left arm numbness. Chest pressure began while she was working at her computer. Was noted be hypertensive in the emergency department. She was admitted for chest pain evaluation.  Past medical history: Hypertension, smoker  Consultants:  None.  Procedures:  None.  Interim History: None of note. Subjective: Feels much better. No chest pain or shortness of breath. She thinks her symptoms were related to her high blood pressure.  Objective:  Filed Vitals:   11/26/11 1600 11/26/11 1651 11/26/11 2057 11/27/11 0540  BP: 163/66 124/86 116/78 106/73  Pulse:  77 63 67  Temp:  98.2 F (36.8 C) 98.5 F (36.9 C) 98.5 F (36.9 C)  TempSrc:  Oral  Oral  Resp: 18 20 18 18   Height:  5\' 4"  (1.626 m)    Weight:  71.5 kg (157 lb 10.1 oz)  71.532 kg (157 lb 11.2 oz)  SpO2:  93% 97% 97%     Intake/Output Summary (Last 24 hours) at 11/27/11 1415 Last data filed at 11/27/11 0830  Gross per 24 hour  Intake    720 ml  Output    800 ml  Net    -80 ml    Exam: General: Appears well. Cardiovascular: Regular rate and rhythm. No murmur, rub or gallop. Normal respiratory effort. Respiratory: Clear to auscultation bilaterally. No wheezes, rales or rhonchi. Normal respiratory effort.  Data Reviewed: Basic Metabolic Panel:  Lab 11/27/11 5621 11/26/11 1800 11/26/11 1105  NA 136 -- 140  K 3.7 -- 4.0  CL 100 -- 101  CO2 30 -- 27  GLUCOSE 95 -- 102*  BUN 19 -- 17  CREATININE 0.71 0.75 0.63  CALCIUM 8.4 -- 9.1  MG -- 2.0 --  PHOS 4.5 -- --   Liver Function Tests:  Lab 11/27/11 0500  AST 17  ALT 16  ALKPHOS 67  BILITOT 0.5  PROT 5.9*  ALBUMIN 3.0*   CBC:  Lab 11/26/11 1800 11/26/11 1105  WBC 6.3 6.5  NEUTROABS -- --  HGB 13.8 14.1  HCT 39.7 39.9  MCV 94.3  93.2  PLT 161 160   Cardiac Enzymes:  Lab 11/27/11 0904 11/27/11 0028 11/26/11 1800 11/26/11 1109  CKTOTAL 46 51 54 --  CKMB 2.0 2.1 1.9 --  CKMBINDEX -- -- -- --  TROPONINI <0.30 <0.30 <0.30 <0.30   Studies: Dg Chest 2 View  11/26/2011  *RADIOLOGY REPORT*  Clinical Data: Chest pain  CHEST - 2 VIEW  Comparison: None.  Findings: The cardiac silhouette, mediastinum, pulmonary vasculature are within normal limits.  Both lungs are clear. There is no acute bony abnormality.  IMPRESSION: There is no evidence of acute cardiac or pulmonary process.  Original Report Authenticated By: Brandon Melnick, M.D.    Scheduled Meds:   . sodium chloride   Intravenous STAT  . acetaminophen  650 mg Oral Once  . aspirin  81 mg Oral Daily  . enoxaparin  40 mg Subcutaneous Q24H  . lisinopril  20 mg Oral QHS  . metoprolol tartrate  12.5 mg Oral BID  . nicotine  14 mg Transdermal Daily  . pneumococcal 23 valent vaccine  0.5 mL Intramuscular Tomorrow-1000  . DISCONTD: ibuprofen  400 mg Oral Once   Continuous Infusions:    Assessment/Plan: 1. Chest pain: Resolved. Etiology most likely hypertension. She also has chronic  neck problems which may have contributed to her left arm paresthesias. Her symptoms are completely resolved and she desires discharge home. She does not want any further cardiac evaluation. 2. Uncontrolled hypertension: Much improved. Will increase her lisinopril. She may need a second agent. I stressed that she needs to followup with her primary care physician soon.   Certainly she has recurrent symptoms she should seek immediate evaluation. However I doubt coronary disease at this point. She understands that coronary disease however cannot be excluded without further testing she wishes to defer consideration of this at this time.   Brendia Sacks, MD  [facility] Pager [phone] 11/27/2011, 2:15 PM    LOS: 1 day

## 2012-01-10 ENCOUNTER — Other Ambulatory Visit (HOSPITAL_COMMUNITY): Payer: Self-pay | Admitting: Family Medicine

## 2012-05-07 ENCOUNTER — Other Ambulatory Visit: Payer: Self-pay | Admitting: Family Medicine

## 2012-05-07 DIAGNOSIS — R1013 Epigastric pain: Secondary | ICD-10-CM

## 2012-05-11 ENCOUNTER — Other Ambulatory Visit: Payer: BC Managed Care – PPO

## 2012-10-24 ENCOUNTER — Other Ambulatory Visit: Payer: Self-pay | Admitting: Obstetrics and Gynecology

## 2012-10-24 DIAGNOSIS — Z1231 Encounter for screening mammogram for malignant neoplasm of breast: Secondary | ICD-10-CM

## 2012-11-30 ENCOUNTER — Ambulatory Visit: Payer: BC Managed Care – PPO

## 2013-01-03 ENCOUNTER — Encounter: Payer: Self-pay | Admitting: Obstetrics and Gynecology

## 2013-01-03 ENCOUNTER — Ambulatory Visit (INDEPENDENT_AMBULATORY_CARE_PROVIDER_SITE_OTHER): Payer: 59 | Admitting: Obstetrics and Gynecology

## 2013-01-03 VITALS — BP 100/60 | HR 106 | Ht 63.5 in | Wt 157.0 lb

## 2013-01-03 DIAGNOSIS — Z01419 Encounter for gynecological examination (general) (routine) without abnormal findings: Secondary | ICD-10-CM

## 2013-01-03 DIAGNOSIS — E785 Hyperlipidemia, unspecified: Secondary | ICD-10-CM

## 2013-01-03 NOTE — Progress Notes (Signed)
Subjective:  Last Pap: 01/05/12 WNL: Yes  Due 2016 Regular Periods:no Contraception: post menopausal  Monthly Breast exam:no Tetanus<83yrs:no Nl.Bladder Function:yes Daily BMs:yes Healthy Diet:yes Calcium:no Mammogram:yes Date of Mammogram: 12/2011 Exercise:yes Have often Exercise: 3 times per week  Seatbelt: yes Abuse at home: no Stressful work:yes Sigmoid-colonoscopy: 10/2011 Bone Density: Yes 5 years ago  PCP: Dr. Cyndia Bent  Change in PMH: no changes Change in [facility]: no changes  Caroline [name] is a 57 y.o. female G3P2 who presents for annual exam.  She. She specifically denies hot flashes or vaginal dryness. She does have chronic sleep difficulties. Her main concerns are symptoms of muscle aches and a cough, which she thinks may be related to her medications for her lipids and blood pressure. She plans to visit her primary care physician for evaluation.  The following portions of the patient's history were reviewed and updated as appropriate: allergies, current medications, past family history, past medical history, past social history, past surgical history and problem list.  Review of Systems Constitutional: negative, new back and leg muscle pain Gastrointestinal:No change in bowel habits, no abdominal pain, no rectal bleeding Genitourinary:negative for dysuria, frequency, hematuria, nocturia and urinary incontinence    Objective:     BP 100/60  Pulse 106  Ht 5' 3.5" (1.613 m)  Wt 157 lb (71.215 kg)  BMI 27.38 kg/m2  LMP 11/25/2010  Weight:  Wt Readings from Last 1 Encounters:  01/03/13 157 lb (71.215 kg)     BMI: Body mass index is 27.38 kg/(m^2). General Appearance: Alert, appropriate appearance for age. No acute distress HEENT: Grossly normal Neck / Thyroid: Supple, no masses, nodes or enlargement Lungs: clear to auscultation bilaterally Back: No CVA tenderness Breast Exam: No masses or nodes.No dimpling, nipple retraction or discharge. Cardiovascular: Regular  rate and rhythm. S1, S2, no murmur Gastrointestinal: Soft, non-tender, no masses or organomegaly Pelvic Exam: Vulva and vagina appear normal. Bimanual exam reveals normal uterus and adnexa. Rectovaginal: normal rectal, no masses Lymphatic Exam: Non-palpable nodes in neck, clavicular, axillary, or inguinal regions Skin: no rash or abnormalities Neurologic: Normal gait and speech, no tremor  Psychiatric: Alert and oriented, appropriate affect.    Urinalysis:Not done    Assessment:    Menopause Myalgias and nonproductive cough, question related to medication    Plan:  Bone density mammogram pap smear due 2016 return annually or prn    Dierdre Forth MD

## 2013-01-10 ENCOUNTER — Other Ambulatory Visit: Payer: Self-pay

## 2013-01-11 ENCOUNTER — Other Ambulatory Visit: Payer: 59

## 2013-02-01 ENCOUNTER — Ambulatory Visit: Payer: BC Managed Care – PPO

## 2013-02-09 ENCOUNTER — Other Ambulatory Visit: Payer: Self-pay

## 2013-04-12 ENCOUNTER — Ambulatory Visit
Admission: RE | Admit: 2013-04-12 | Discharge: 2013-04-12 | Disposition: A | Discharge status: Home / Self Care | Payer: 59 | Source: Ambulatory Visit | Attending: Obstetrics and Gynecology | Admitting: Obstetrics and Gynecology

## 2013-04-12 DIAGNOSIS — Z1231 Encounter for screening mammogram for malignant neoplasm of breast: Secondary | ICD-10-CM

## 2013-10-31 ENCOUNTER — Other Ambulatory Visit: Payer: Self-pay

## 2014-03-20 ENCOUNTER — Other Ambulatory Visit: Payer: Self-pay

## 2014-03-20 DIAGNOSIS — Z1231 Encounter for screening mammogram for malignant neoplasm of breast: Secondary | ICD-10-CM

## 2014-04-18 ENCOUNTER — Ambulatory Visit: Payer: 59

## 2014-04-25 ENCOUNTER — Ambulatory Visit
Admission: RE | Admit: 2014-04-25 | Discharge: 2014-04-25 | Disposition: A | Discharge status: Home / Self Care | Payer: 59 | Source: Ambulatory Visit

## 2014-04-25 DIAGNOSIS — Z1231 Encounter for screening mammogram for malignant neoplasm of breast: Secondary | ICD-10-CM

## 2014-10-27 ENCOUNTER — Encounter: Payer: Self-pay | Admitting: Obstetrics and Gynecology

## 2015-04-07 ENCOUNTER — Other Ambulatory Visit: Payer: Self-pay

## 2015-04-07 DIAGNOSIS — Z1231 Encounter for screening mammogram for malignant neoplasm of breast: Secondary | ICD-10-CM

## 2015-05-08 ENCOUNTER — Ambulatory Visit: Payer: Self-pay

## 2015-05-19 ENCOUNTER — Ambulatory Visit: Payer: Self-pay

## 2015-06-19 ENCOUNTER — Ambulatory Visit
Admission: RE | Admit: 2015-06-19 | Discharge: 2015-06-19 | Disposition: A | Discharge status: Home / Self Care | Payer: 59 | Source: Ambulatory Visit

## 2015-06-19 DIAGNOSIS — Z1231 Encounter for screening mammogram for malignant neoplasm of breast: Secondary | ICD-10-CM

## 2016-05-18 ENCOUNTER — Other Ambulatory Visit: Payer: Self-pay

## 2016-05-18 DIAGNOSIS — Z1231 Encounter for screening mammogram for malignant neoplasm of breast: Secondary | ICD-10-CM

## 2016-06-24 ENCOUNTER — Ambulatory Visit: Payer: 59

## 2016-07-15 ENCOUNTER — Ambulatory Visit
Admission: RE | Admit: 2016-07-15 | Discharge: 2016-07-15 | Disposition: A | Discharge status: Home / Self Care | Payer: 59 | Source: Ambulatory Visit

## 2016-07-15 DIAGNOSIS — Z1231 Encounter for screening mammogram for malignant neoplasm of breast: Secondary | ICD-10-CM

## 2016-09-29 ENCOUNTER — Telehealth (HOSPITAL_COMMUNITY): Payer: Self-pay | Admitting: *Deleted

## 2016-09-29 ENCOUNTER — Emergency Department (HOSPITAL_COMMUNITY): Payer: 59

## 2016-09-29 ENCOUNTER — Encounter (HOSPITAL_COMMUNITY): Payer: Self-pay | Admitting: Emergency Medicine

## 2016-09-29 ENCOUNTER — Emergency Department (HOSPITAL_COMMUNITY)
Admission: EM | Admit: 2016-09-29 | Discharge: 2016-09-29 | Disposition: A | Discharge status: Home / Self Care | Payer: 59 | Attending: Emergency Medicine | Admitting: Emergency Medicine

## 2016-09-29 DIAGNOSIS — Z79899 Other long term (current) drug therapy: Secondary | ICD-10-CM | POA: Diagnosis not present

## 2016-09-29 DIAGNOSIS — I4891 Unspecified atrial fibrillation: Secondary | ICD-10-CM | POA: Diagnosis not present

## 2016-09-29 DIAGNOSIS — R002 Palpitations: Secondary | ICD-10-CM | POA: Diagnosis present

## 2016-09-29 DIAGNOSIS — F17210 Nicotine dependence, cigarettes, uncomplicated: Secondary | ICD-10-CM | POA: Diagnosis not present

## 2016-09-29 DIAGNOSIS — I10 Essential (primary) hypertension: Secondary | ICD-10-CM | POA: Diagnosis not present

## 2016-09-29 LAB — CBC
HCT: 41.4 % (ref 36.0–46.0)
Hemoglobin: 13.9 g/dL (ref 12.0–15.0)
MCH: 33 pg (ref 26.0–34.0)
MCHC: 33.6 g/dL (ref 30.0–36.0)
MCV: 98.3 fL (ref 78.0–100.0)
PLATELETS: 248 10*3/uL (ref 150–400)
RBC: 4.21 MIL/uL (ref 3.87–5.11)
RDW: 13.6 % (ref 11.5–15.5)
WBC: 10.3 10*3/uL (ref 4.0–10.5)

## 2016-09-29 LAB — BASIC METABOLIC PANEL
Anion gap: 17 — ABNORMAL HIGH (ref 5–15)
BUN: 11 mg/dL (ref 6–20)
CALCIUM: 8.9 mg/dL (ref 8.9–10.3)
CHLORIDE: 93 mmol/L — AB (ref 101–111)
CO2: 24 mmol/L (ref 22–32)
Creatinine, Ser: 0.6 mg/dL (ref 0.44–1.00)
GFR calc non Af Amer: >60 mL/min (ref >60)
Glucose, Bld: 105 mg/dL — ABNORMAL HIGH (ref 65–99)
POTASSIUM: 3.3 mmol/L — AB (ref 3.5–5.1)
Sodium: 134 mmol/L — ABNORMAL LOW (ref 135–145)

## 2016-09-29 LAB — I-STAT TROPONIN, ED: Troponin i, poc: 0 ng/mL (ref 0.00–0.08)

## 2016-09-29 MED ORDER — SODIUM CHLORIDE 0.9 % IV SOLN
INTRAVENOUS | Status: DC
  Administered 2016-09-29: 06:00:00 via INTRAVENOUS

## 2016-09-29 MED ORDER — METOPROLOL TARTRATE 5 MG/5ML IV SOLN
5.0000 mg | Freq: Once | INTRAVENOUS | Status: AC
  Administered 2016-09-29: 5 mg via INTRAVENOUS
  Filled 2016-09-29: qty 5

## 2016-09-29 MED ORDER — ETOMIDATE 2 MG/ML IV SOLN: 0.2000 mg/kg | Freq: Once | INTRAVENOUS | Status: DC

## 2016-09-29 MED ORDER — METOPROLOL SUCCINATE ER 25 MG PO TB24: 25.0000 mg | ORAL_TABLET | Freq: Every day | ORAL | 0 refills | Status: DC

## 2016-09-29 NOTE — ED Triage Notes (Signed)
Patient comes to ED with chest pain and mild shortness of breath that started 45 mins ago.  Patient states she is feeling extremely thirsty.  No nausea or vomiting with the chest pain. States she has had this before, but usually goes away.  This feeling is different to her.

## 2016-09-29 NOTE — Telephone Encounter (Signed)
Left message for pt to call to schedule ER follow up appt

## 2016-09-29 NOTE — ED Notes (Signed)
Portable chest xray in progress at this time.

## 2016-09-29 NOTE — ED Notes (Signed)
Dr Wentz at bedside at this time 

## 2016-09-29 NOTE — ED Notes (Signed)
Pt reports improvement of chest discomfort and sob. Repeat EKG completed and given to Dr.Wentz. HR 114 in ST

## 2016-09-29 NOTE — Discharge Instructions (Signed)
Get plenty of rest and drink a lot of fluids.  Avoid any products to stimulate your heart, such as caffeine.  The cardiology service, with [facility], will call you to schedule a follow-up appointment.  Return here, if needed, for problems.

## 2016-09-29 NOTE — ED Provider Notes (Addendum)
[facility] DEPT Provider Note   CSN: [id number] Arrival date & time: 09/29/16  0426     History   Chief Complaint Chief Complaint  Patient presents with  . Chest Pain  . Shortness of Breath    HPI Caroline [name] is a 60 y.o. female.  She presents for evaluation of palpitations, with sensation of rapid heartbeat, which awoke her from sleep, within the last hour. She denies chest pain, nausea, vomiting, diaphoresis, weakness or dizziness. History of the same, briefly, several times, and evaluated by cardiology about a year ago with a Holter monitor. She was told that she had a benign "normal" problem. She denies recent fever, chills, nausea, vomiting, cough, shortness of breath or chest pain. No other changes in health. There are no other known modifying factors.  HPI  Past Medical History:  Diagnosis Date  . Hypertension     Patient Active Problem List   Diagnosis Date Noted  . Hyperlipidemia 01/03/2013  . Chest pain 11/26/2011  . Tobacco abuse 11/26/2011  . HTN (hypertension) 11/26/2011    Past Surgical History:  Procedure Laterality Date  . CESAREAN SECTION  [phone]    OB History    Gravida Para Term Preterm AB Living   3 2       2    SAB TAB Ectopic Multiple Live Births                   Home Medications    Prior to Admission medications   Medication Sig Start Date End Date Taking? Authorizing Provider  buPROPion (WELLBUTRIN XL) 150 MG 24 hr tablet Take 150 mg by mouth daily. 03/29/16  Yes Historical Provider, MD  valsartan-hydrochlorothiazide (DIOVAN-HCT) 160-12.5 MG tablet Take 1 tablet by mouth daily. 09/15/16  Yes Historical Provider, MD  lisinopril (PRINIVIL,ZESTRIL) 40 MG tablet Take 1 tablet (40 mg total) by mouth at bedtime. Patient not taking: Reported on 09/29/2016 11/27/11   Samuella Cota, MD    Family History No family history on file.  Social History Social History  Substance Use Topics  . Smoking status: Current Every Day  Smoker    Packs/day: 1.00    Types: Cigarettes  . Smokeless tobacco: Never Used  . Alcohol use 4.2 oz/week    7 Glasses of wine per week     Allergies   Sulfa antibiotics   Review of Systems Review of Systems  All other systems reviewed and are negative.    Physical Exam Updated Vital Signs BP 131/95   Pulse 116   Temp 98.8 F (37.1 C) (Oral)   Resp 17   Ht 5\' 4"  (1.626 m)   Wt 160 lb (72.6 kg)   LMP 11/25/2010   SpO2 94%   BMI 27.46 kg/m   Physical Exam  Constitutional: She is oriented to person, place, and time. She appears well-developed and well-nourished.  HENT:  Head: Normocephalic and atraumatic.  Eyes: Conjunctivae and EOM are normal. Pupils are equal, round, and reactive to light.  Neck: Normal range of motion and phonation normal. Neck supple.  Cardiovascular: Exam reveals no friction rub.   No murmur heard. Irregular tachycardia  Pulmonary/Chest: Effort normal and breath sounds normal. She exhibits no tenderness.  Abdominal: Soft. She exhibits no distension. There is no tenderness. There is no guarding.  Musculoskeletal: Normal range of motion. She exhibits no edema or deformity.  Neurological: She is alert and oriented to person, place, and time. She exhibits normal muscle tone.  Skin: Skin is warm  and dry.  Psychiatric: She has a normal mood and affect. Her behavior is normal. Judgment and thought content normal.  Nursing note and vitals reviewed.    ED Treatments / Results  Labs (all labs ordered are listed, but only abnormal results are displayed) Labs Reviewed  BASIC METABOLIC PANEL - Abnormal; Notable for the following:       Result Value   Sodium 134 (*)    Potassium 3.3 (*)    Chloride 93 (*)    Glucose, Bld 105 (*)    Anion gap 17 (*)    All other components within normal limits  CBC  I-STAT TROPOININ, ED    EKG  EKG Interpretation  Date/Time:  Thursday September 29 2016 04:32:46 EDT Ventricular Rate:  178 PR Interval:    QRS  Duration: 66 QT Interval:  248 QTC Calculation: 426 R Axis:   88 Text Interpretation:  Atrial fibrillation with rapid ventricular response Nonspecific ST and T wave abnormality Abnormal ECG Since last tracing new atrial fibrillation with RVR Confirmed by Eulis Foster  MD, Sybella Harnish [phone]) on 09/29/2016 4:57:24 AM       EKG Interpretation  Date/Time:  Thursday September 29 2016 06:05:06 EDT Ventricular Rate:  116 PR Interval:    QRS Duration: 72 QT Interval:  328 QTC Calculation: 456 R Axis:   98 Text Interpretation:  Sinus tachycardia Borderline right axis deviation Borderline T abnormalities, anterior leads Since last tracing of earlier today converted now to sinus tachycardia Confirmed by Eulis Foster  MD, Nickola Lenig [phone]) on 09/29/2016 6:16:07 AM          Radiology Dg Chest Portable 1 View  Result Date: 09/29/2016 CLINICAL DATA:  Left-sided chest pain tonight EXAM: PORTABLE CHEST 1 VIEW COMPARISON:  11/26/2011 FINDINGS: The heart size and mediastinal contours are within normal limits. Both lungs are clear. The visualized skeletal structures are unremarkable. IMPRESSION: No active disease. Electronically Signed   By: Lucienne Capers M.D.   On: 09/29/2016 05:11    Procedures Procedures (including critical care time)  Medications Ordered in ED Medications  etomidate (AMIDATE) injection 14.52 mg (not administered)  0.9 %  sodium chloride infusion (not administered)  metoprolol (LOPRESSOR) injection 5 mg (not administered)     Initial Impression / Assessment and Plan / ED Course  I have reviewed the triage vital signs and the nursing notes.  Pertinent labs & imaging results that were available during my care of the patient were reviewed by me and considered in my medical decision making (see chart for details).  Clinical Course    Medications  0.9 %  sodium chloride infusion ( Intravenous New Bag/Given 09/29/16 0622)  metoprolol (LOPRESSOR) injection 5 mg (5 mg Intravenous Given 09/29/16  0622)    Patient Vitals for the past 24 hrs:  BP Temp Temp src Pulse Resp SpO2 Height Weight  09/29/16 0700 109/78 - - 103 17 95 % - -  09/29/16 0630 106/85 - - 95 15 91 % - -  09/29/16 0615 111/90 - - 111 12 92 % - -  09/29/16 0600 131/95 - - 116 17 94 % - -  09/29/16 0545 126/100 - - (!) 133 15 95 % - -  09/29/16 0437 122/87 98.8 F (37.1 C) Oral (!) 180 17 - 5\' 4"  (1.626 m) 160 lb (72.6 kg)    7:14 AM Reevaluation with update and discussion. After initial assessment and treatment, an updated evaluation reveals She is now comfortable, denies chest pain, palpitations, weakness, dizziness. Heart rate  now 100-101, sinus. Patient and husband informed of findings. Answered all questions. Samayah Novinger L   07:15- Page requested to Cardiology, to arrange follow up. Call returned at 8:15- they will call patient to arrange follow-up. The patient's phone number is [phone]  CHA2DS2/VAS Stroke Risk Points      2 >= 2 Points: High Risk  1 - 1.99 Points: Medium Risk  0 Points: Low Risk    The patient's score has not changed in the past year.:  No Change         Details    Note: External data might be a factor in metrics not marked with    Points Metrics   This score determines the patient's risk of having a stroke if the  patient has atrial fibrillation.       0 Has Congestive Heart Failure:  No   0 Has Vascular Disease:  No   1 Has Hypertension:  Yes   0 Age:  89   0 Has Diabetes:  No   0 Had Stroke:  No Had TIA:  No Had thromboembolism:  No   1 Female:  Yes         CRITICAL CARE Performed by: Daleen Bo L Total critical care time: 45 minutes Critical care time was exclusive of separately billable procedures and treating other patients. Critical care was necessary to treat or prevent imminent or life-threatening deterioration. Critical care was time spent personally by me on the following activities: development of treatment plan with patient and/or surrogate as well as  nursing, discussions with consultants, evaluation of patient's response to treatment, examination of patient, obtaining history from patient or surrogate, ordering and performing treatments and interventions, ordering and review of laboratory studies, ordering and review of radiographic studies, pulse oximetry and re-evaluation of patient's condition.   Final Clinical Impressions(s) / ED Diagnoses   Final diagnoses:  Atrial fibrillation with RVR (HCC)   New-onset atrial fibrillation, with rapid ventricular rate, spontaneously converted after IV fluids. Patient maintained sinus rhythm, top normal rate. No evidence for metabolic instability, ACS, PE or serious bacterial infection.  Nursing Notes Reviewed/ Care Coordinated Applicable Imaging Reviewed Interpretation of Laboratory Data incorporated into ED treatment  The patient appears reasonably screened and/or stabilized for discharge and I doubt any other medical condition or other [facility] requiring further screening, evaluation, or treatment in the ED at this time prior to discharge.  Plan: Home Medications- continue; Home Treatments- rest, fluids, avoid caffeine; return here if the recommended treatment, does not improve the symptoms; Recommended follow up- PCP prn. Cardiology 1 week.     New Prescriptions New Prescriptions   No medications on file     Daleen Bo, MD 09/29/16 [place], MD 10/04/16 1158

## 2016-09-30 ENCOUNTER — Telehealth (HOSPITAL_COMMUNITY): Payer: Self-pay | Admitting: *Deleted

## 2016-09-30 NOTE — Telephone Encounter (Signed)
Patient called in this morning stating she needed to make a follow up appointment from ER visit and needed to be seen today b/c she was still having fluttering in her chest at night when she is lying down. Upon questioning patient - she did not get prescription filled for metoprolol from ER until today and had not taken it yet this morning. The ER physician had given IV metoprolol prior to discharge and was told this would cover her until today. Instructed patient to go ahead and take metoprolol now and this evening if she is feeling fluttering in her chest and her SBP is >100 to take dose tonight and she can do this over the weekend if warranted. Appt made for Monday at 830am. On-call physician number given to patient as well as when she should report to ER if necessary. Pt verbalized understanding of instructions.

## 2016-10-03 ENCOUNTER — Ambulatory Visit (HOSPITAL_COMMUNITY)
Admission: RE | Admit: 2016-10-03 | Discharge: 2016-10-03 | Disposition: A | Discharge status: Home / Self Care | Payer: 59 | Source: Ambulatory Visit | Attending: Nurse Practitioner | Admitting: Nurse Practitioner

## 2016-10-03 ENCOUNTER — Other Ambulatory Visit (HOSPITAL_COMMUNITY): Payer: Self-pay | Admitting: *Deleted

## 2016-10-03 ENCOUNTER — Encounter (HOSPITAL_COMMUNITY): Payer: Self-pay | Admitting: Nurse Practitioner

## 2016-10-03 VITALS — BP 142/90 | HR 81 | Ht 64.0 in | Wt 168.8 lb

## 2016-10-03 DIAGNOSIS — Z720 Tobacco use: Secondary | ICD-10-CM | POA: Diagnosis not present

## 2016-10-03 DIAGNOSIS — R0683 Snoring: Secondary | ICD-10-CM | POA: Insufficient documentation

## 2016-10-03 DIAGNOSIS — I480 Paroxysmal atrial fibrillation: Secondary | ICD-10-CM

## 2016-10-03 DIAGNOSIS — I10 Essential (primary) hypertension: Secondary | ICD-10-CM | POA: Diagnosis not present

## 2016-10-03 DIAGNOSIS — F17210 Nicotine dependence, cigarettes, uncomplicated: Secondary | ICD-10-CM | POA: Diagnosis not present

## 2016-10-03 DIAGNOSIS — F1010 Alcohol abuse, uncomplicated: Secondary | ICD-10-CM | POA: Diagnosis not present

## 2016-10-03 DIAGNOSIS — Z7901 Long term (current) use of anticoagulants: Secondary | ICD-10-CM | POA: Diagnosis not present

## 2016-10-03 DIAGNOSIS — E876 Hypokalemia: Secondary | ICD-10-CM | POA: Diagnosis not present

## 2016-10-03 DIAGNOSIS — F17200 Nicotine dependence, unspecified, uncomplicated: Secondary | ICD-10-CM

## 2016-10-03 DIAGNOSIS — Z79899 Other long term (current) drug therapy: Secondary | ICD-10-CM | POA: Diagnosis not present

## 2016-10-03 DIAGNOSIS — I4891 Unspecified atrial fibrillation: Secondary | ICD-10-CM

## 2016-10-03 HISTORY — DX: Paroxysmal atrial fibrillation: I48.0

## 2016-10-03 LAB — BASIC METABOLIC PANEL
Anion gap: 8 (ref 5–15)
BUN: 15 mg/dL (ref 6–20)
CHLORIDE: 102 mmol/L (ref 101–111)
CO2: 28 mmol/L (ref 22–32)
CREATININE: 0.65 mg/dL (ref 0.44–1.00)
Calcium: 8.9 mg/dL (ref 8.9–10.3)
GFR calc Af Amer: >60 mL/min (ref >60)
GFR calc non Af Amer: >60 mL/min (ref >60)
GLUCOSE: 107 mg/dL — AB (ref 65–99)
Potassium: 3.8 mmol/L (ref 3.5–5.1)
SODIUM: 138 mmol/L (ref 135–145)

## 2016-10-03 LAB — CBC
HEMATOCRIT: 38.9 % (ref 36.0–46.0)
HEMOGLOBIN: 12.9 g/dL (ref 12.0–15.0)
MCH: 33.2 pg (ref 26.0–34.0)
MCHC: 33.2 g/dL (ref 30.0–36.0)
MCV: 100 fL (ref 78.0–100.0)
Platelets: 207 10*3/uL (ref 150–400)
RBC: 3.89 MIL/uL (ref 3.87–5.11)
RDW: 14.1 % (ref 11.5–15.5)
WBC: 7.2 10*3/uL (ref 4.0–10.5)

## 2016-10-03 MED ORDER — RIVAROXABAN 20 MG PO TABS: 20.0000 mg | ORAL_TABLET | Freq: Every day | ORAL | 6 refills | Status: DC

## 2016-10-03 MED ORDER — METOPROLOL SUCCINATE ER 25 MG PO TB24: 25.0000 mg | ORAL_TABLET | Freq: Every day | ORAL | 2 refills | Status: DC

## 2016-10-03 NOTE — Progress Notes (Signed)
Primary Care Physician: Chesley Noon, MD Referring Physician: ER   Caroline [name] is a 60 y.o. female with a history of paroxysmal atrial fibrillation who presents for consultation in the [facility].  The patient was initially diagnosed with atrial fibrillation last week after presenting with symptoms of palpitations and shortness of breath to the ER. She was given IV Metoprolol and converted to SR.  In retrospect, she has been having palpitations for months that are self-limiting. The episode prompting ER evaluation lasted longer than her previous episodes.  She has been taking daily Metoprolol with significant improvement in palpitations.   Today, she denies symptoms of chest pain, shortness of breath, orthopnea, PND, lower extremity edema, dizziness, presyncope, syncope, snoring, daytime somnolence, bleeding, or neurologic sequela. The patient is tolerating medications without difficulties and is otherwise without complaint today.    Atrial Fibrillation Risk Factors:  she does have symptoms of sleep apnea (snoring)  she does not have a history of rheumatic fever.  she does have a history of alcohol use.  she has a BMI of Body mass index is 28.97 kg/m.Marland Kitchen Filed Weights   10/03/16 0902  Weight: 168 lb 12.8 oz (76.6 kg)     Atrial Fibrillation Management history:  Previous antiarrhythmic drugs: none  Previous cardioversions: none  Previous ablations: none  CHADS2VASC score: 2  Anticoagulation history: Xarelto started 09/2016   Past Medical History:  Diagnosis Date  . Hypertension   . Paroxysmal atrial fibrillation [facility])    Past Surgical History:  Procedure Laterality Date  . CESAREAN SECTION  [phone]    Current Outpatient Prescriptions  Medication Sig Dispense Refill  . buPROPion (WELLBUTRIN XL) 150 MG 24 hr tablet Take 150 mg by mouth daily.    . metoprolol succinate (TOPROL-XL) 25 MG 24 hr tablet Take 1 tablet (25 mg  total) by mouth daily. 30 tablet 0  . valsartan-hydrochlorothiazide (DIOVAN-HCT) 160-12.5 MG tablet Take 1 tablet by mouth daily.    . rivaroxaban (XARELTO) 20 MG TABS tablet Take 1 tablet (20 mg total) by mouth daily with supper. 30 tablet 6   No current facility-administered medications for this encounter.     Allergies  Allergen Reactions  . Sulfa Antibiotics Other (See Comments)    unknown    Social History   Social History  . Marital status: Married    Spouse name: N/A  . Number of children: N/A  . Years of education: N/A   Occupational History  . Not on file.   Social History Main Topics  . Smoking status: Current Every Day Smoker    Packs/day: 1.00    Types: Cigarettes  . Smokeless tobacco: Never Used  . Alcohol use 4.2 oz/week    7 Glasses of wine per week  . Drug use: No  . Sexual activity: Yes    Birth control/ protection: Post-menopausal   Other Topics Concern  . Not on file   Social History Narrative  . No narrative on file    Family History: dad had several strokes.  The patient does not have a history of early familial atrial fibrillation or other arrhythmias.  ROS- All systems are reviewed and negative except as per the HPI above.  Physical Exam: Vitals:   10/03/16 0902  BP: (!) 142/90  Pulse: 81  Weight: 168 lb 12.8 oz (76.6 kg)  Height: 5\' 4"  (1.626 m)    GEN- The patient is well appearing, alert and oriented x 3 today.  Head- normocephalic, atraumatic Eyes-  Sclera clear, conjunctiva pink Ears- hearing intact Oropharynx- clear Neck- supple  Lungs- Clear to ausculation bilaterally, normal work of breathing Heart- Regular rate and rhythm, no murmurs, rubs or gallops  GI- soft, NT, ND, + BS Extremities- no clubbing, cyanosis, or edema MS- no significant deformity or atrophy Skin- no rash or lesion Psych- euthymic mood, full affect Neuro- strength and sensation are intact  Wt Readings from Last 3 Encounters:  10/03/16 168 lb 12.8  oz (76.6 kg)  09/29/16 160 lb (72.6 kg)  01/03/13 157 lb (71.2 kg)    EKG today demonstrates sinus rhythm, rate 81, LAE, normal intervals  No previous echo in system  Epic records are reviewed at length today  Assessment and Plan:  1. Paroxysmal atrial fibrillation The patient has symptomatic paroxysmal/ persistent atrial fibrillation.  The patients CHAD2VASC score is 2.   She is doing well on daily Metoprolol.  Will continue for now.  We had an extensive discussion about the pathophysiology of atrial fibrillation, rationale for anticoagulation, rate control, rhythm control, and lifestyle modification. We discussed anticoagulation options including Warfarin and DOAC's today.  Will start on Xarelto 20mg  daily. BMET, CBC today Update echo Decrease ETOH and caffeine intake Encouraged daily exercise as well as yoga for stress relief.   2. ETOH abuse The patient was drinking several glasses per wine a night. We discussed relationship between ETOH and AF burden. She has cut down to 1/2 glass per night  3.  Tobacco abuse Cessation advised    4.  HTN Stable No change required today  5.  Hypokalemia Recheck BMET today   6.  Snoring Consider evaluation with sleep study at follow up visit   Plan: BMET, CBC, echo, follow up AF clinic in 2 weeks.    Chanetta Marshall, NP 10/03/2016 10:01 AM

## 2016-10-17 ENCOUNTER — Other Ambulatory Visit: Payer: Self-pay

## 2016-10-17 ENCOUNTER — Ambulatory Visit (HOSPITAL_BASED_OUTPATIENT_CLINIC_OR_DEPARTMENT_OTHER)
Admission: RE | Admit: 2016-10-17 | Discharge: 2016-10-17 | Disposition: A | Discharge status: Home / Self Care | Payer: 59 | Source: Ambulatory Visit | Attending: Nurse Practitioner | Admitting: Nurse Practitioner

## 2016-10-17 ENCOUNTER — Encounter (HOSPITAL_COMMUNITY): Payer: Self-pay | Admitting: Nurse Practitioner

## 2016-10-17 ENCOUNTER — Ambulatory Visit (HOSPITAL_COMMUNITY)
Admission: RE | Admit: 2016-10-17 | Discharge: 2016-10-17 | Disposition: A | Discharge status: Home / Self Care | Payer: 59 | Source: Ambulatory Visit | Attending: Nurse Practitioner | Admitting: Nurse Practitioner

## 2016-10-17 VITALS — BP 118/78 | HR 82 | Ht 64.0 in | Wt 162.8 lb

## 2016-10-17 DIAGNOSIS — I480 Paroxysmal atrial fibrillation: Secondary | ICD-10-CM | POA: Diagnosis not present

## 2016-10-17 DIAGNOSIS — Z720 Tobacco use: Secondary | ICD-10-CM | POA: Insufficient documentation

## 2016-10-17 DIAGNOSIS — I10 Essential (primary) hypertension: Secondary | ICD-10-CM | POA: Diagnosis not present

## 2016-10-17 DIAGNOSIS — I4891 Unspecified atrial fibrillation: Secondary | ICD-10-CM | POA: Diagnosis present

## 2016-10-17 NOTE — Progress Notes (Signed)
Primary Care Physician: Chesley Noon, MD Referring Physician: [facility] ER   Caroline [name] is a 60 y.o. female with a h/o paroxysmal afib that was diagnosed in the [facility] ER and had f/u with Chanetta Marshall, NP, in the afib f/u. Echo done this am but has not been read.She is on metoprolol ER and Xarelto 20 mg qd for a chadsvasc score of 2(htn, female). She will feel a rare palpitation but lasts just a few seconds. She has cut down on alcohol intake.  Today, she denies symptoms of palpitations, chest pain, shortness of breath, orthopnea, PND, lower extremity edema, dizziness, presyncope, syncope, or neurologic sequela. The patient is tolerating medications without difficulties and is otherwise without complaint today.   Past Medical History:  Diagnosis Date  . Hypertension   . Paroxysmal atrial fibrillation [facility])    Past Surgical History:  Procedure Laterality Date  . CESAREAN SECTION  [phone]    Current Outpatient Prescriptions  Medication Sig Dispense Refill  . buPROPion (WELLBUTRIN XL) 150 MG 24 hr tablet Take 150 mg by mouth daily.    . metoprolol succinate (TOPROL-XL) 25 MG 24 hr tablet Take 1 tablet (25 mg total) by mouth daily. 90 tablet 2  . rivaroxaban (XARELTO) 20 MG TABS tablet Take 1 tablet (20 mg total) by mouth daily with supper. 30 tablet 6  . valsartan-hydrochlorothiazide (DIOVAN-HCT) 160-12.5 MG tablet Take 1 tablet by mouth daily.     No current facility-administered medications for this encounter.     Allergies  Allergen Reactions  . Sulfa Antibiotics Other (See Comments)    unknown    Social History   Social History  . Marital status: Married    Spouse name: N/A  . Number of children: N/A  . Years of education: N/A   Occupational History  . Not on file.   Social History Main Topics  . Smoking status: Current Every Day Smoker    Packs/day: 1.00    Types: Cigarettes  . Smokeless tobacco: Never Used  . Alcohol use 4.2 oz/week    7 Glasses  of wine per week  . Drug use: No  . Sexual activity: Yes    Birth control/ protection: Post-menopausal   Other Topics Concern  . Not on file   Social History Narrative  . No narrative on file    No family history on file.  ROS- All systems are reviewed and negative except as per the HPI above  Physical Exam: Vitals:   10/17/16 1053  BP: 118/78  Pulse: 82  Weight: 162 lb 12.8 oz (73.8 kg)  Height: 5\' 4"  (1.626 m)    GEN- The patient is well appearing, alert and oriented x 3 today.   Head- normocephalic, atraumatic Eyes-  Sclera clear, conjunctiva pink Ears- hearing intact Oropharynx- clear Neck- supple, no JVP Lymph- no cervical lymphadenopathy Lungs- Clear to ausculation bilaterally, normal work of breathing Heart- Regular rate and rhythm, no murmurs, rubs or gallops, PMI not laterally displaced GI- soft, NT, ND, + BS Extremities- no clubbing, cyanosis, or edema MS- no significant deformity or atrophy Skin- no rash or lesion Psych- euthymic mood, full affect Neuro- strength and sensation are intact  EKG- NSR at 82 bpm, pr int 146 ms, qrs int 72 bpm, qtc 453 ms Epic records reviewed  Assessment and Plan: 1. Paroxysmal atrial fibrillation  Palpitations improved with BB Continue xarelto with chadsvasc score of 2 Decrease wine intake to no more than 2 a weekend Echo pending- will  let pt know results of test  F/u in afib clinic in 3 months, sooner if needed  Butch Penny C. Caedyn Raygoza, [facility] [address] [place], [place] [address] [phone]

## 2016-10-17 NOTE — Progress Notes (Signed)
  Echocardiogram 2D Echocardiogram has been performed.  Tresa Res 10/17/2016, 10:51 AM

## 2017-01-18 ENCOUNTER — Inpatient Hospital Stay (HOSPITAL_COMMUNITY): Admission: RE | Admit: 2017-01-18 | Payer: 59 | Source: Ambulatory Visit | Admitting: Nurse Practitioner

## 2017-02-10 ENCOUNTER — Ambulatory Visit (HOSPITAL_COMMUNITY)
Admission: RE | Admit: 2017-02-10 | Discharge: 2017-02-10 | Disposition: A | Discharge status: Home / Self Care | Payer: 59 | Source: Ambulatory Visit | Attending: Nurse Practitioner | Admitting: Nurse Practitioner

## 2017-02-10 ENCOUNTER — Encounter (HOSPITAL_COMMUNITY): Payer: Self-pay | Admitting: Nurse Practitioner

## 2017-02-10 VITALS — BP 122/76 | HR 91 | Ht 64.0 in | Wt 167.6 lb

## 2017-02-10 DIAGNOSIS — R002 Palpitations: Secondary | ICD-10-CM

## 2017-02-10 DIAGNOSIS — I480 Paroxysmal atrial fibrillation: Secondary | ICD-10-CM | POA: Diagnosis not present

## 2017-02-10 DIAGNOSIS — R9431 Abnormal electrocardiogram [ECG] [EKG]: Secondary | ICD-10-CM | POA: Diagnosis not present

## 2017-02-10 DIAGNOSIS — I4891 Unspecified atrial fibrillation: Secondary | ICD-10-CM | POA: Diagnosis present

## 2017-02-10 NOTE — Progress Notes (Signed)
Primary Care Physician: Chesley Noon, MD Referring Physician: [facility] ER   Caroline [name] is a 61 y.o. female with a h/o paroxysmal afib that was diagnosed in the [facility] ER and had f/u with Chanetta Marshall, NP, in the afib f/u. Echo done and without significant finds.She is on metoprolol ER and Xarelto 20 mg qd for a chadsvasc score of 2(htn, female).  She is in afib clinic for f/u and co/ of almost daily palpitations, mostly worse at night. She denies any snoring/apnea. She continues to smoke. She will take occasional BB for palpitations. Continues on xarelto.  Today, she denies symptoms of  chest pain, shortness of breath, orthopnea, PND, lower extremity edema, dizziness, presyncope, syncope, or neurologic sequela. The patient is tolerating medications without difficulties and is otherwise without complaint today.   Past Medical History:  Diagnosis Date  . Hypertension   . Paroxysmal atrial fibrillation [facility])    Past Surgical History:  Procedure Laterality Date  . CESAREAN SECTION  [phone]    Current Outpatient Prescriptions  Medication Sig Dispense Refill  . buPROPion (WELLBUTRIN XL) 150 MG 24 hr tablet Take 150 mg by mouth daily.    . metoprolol succinate (TOPROL-XL) 25 MG 24 hr tablet Take 1 tablet (25 mg total) by mouth daily. 90 tablet 2  . rivaroxaban (XARELTO) 20 MG TABS tablet Take 1 tablet (20 mg total) by mouth daily with supper. 30 tablet 6  . valsartan-hydrochlorothiazide (DIOVAN-HCT) 160-12.5 MG tablet Take 1 tablet by mouth daily.     No current facility-administered medications for this encounter.     Allergies  Allergen Reactions  . Sulfa Antibiotics Other (See Comments)    unknown    Social History   Social History  . Marital status: Married    Spouse name: N/A  . Number of children: N/A  . Years of education: N/A   Occupational History  . Not on file.   Social History Main Topics  . Smoking status: Current Every Day Smoker    Packs/day:  1.00    Types: Cigarettes  . Smokeless tobacco: Never Used  . Alcohol use 4.2 oz/week    7 Glasses of wine per week  . Drug use: No  . Sexual activity: Yes    Birth control/ protection: Post-menopausal   Other Topics Concern  . Not on file   Social History Narrative  . No narrative on file    No family history on file.  ROS- All systems are reviewed and negative except as per the HPI above  Physical Exam: Vitals:   02/10/17 1037  BP: 122/76  Pulse: 91  Weight: 167 lb 9.6 oz (76 kg)  Height: 5\' 4"  (1.626 m)    GEN- The patient is well appearing, alert and oriented x 3 today.   Head- normocephalic, atraumatic Eyes-  Sclera clear, conjunctiva pink Ears- hearing intact Oropharynx- clear Neck- supple, no JVP Lymph- no cervical lymphadenopathy Lungs- Clear to ausculation bilaterally, normal work of breathing Heart- Regular rate and rhythm, no murmurs, rubs or gallops, PMI not laterally displaced GI- soft, NT, ND, + BS Extremities- no clubbing, cyanosis, or edema MS- no significant deformity or atrophy Skin- no rash or lesion Psych- euthymic mood, full affect Neuro- strength and sensation are intact  EKG- NSR at 91 bpm, pr int 15ms, qrs int 66 bpm, qtc 435 ms Epic records reviewed  Assessment and Plan: 1. H/o  Paroxysmal atrial fibrillation  Recent increase in palpitations, feels like pounding at times.  At lest one episode daily 48 hour monitor  If afib, will discuss antiarrythmic's on f/u Continue xarelto with chadsvasc score of 2 Decrease wine intake to no more than 2 a weekend Try to stop smoking   F/u in afib clinic in 2-3 weeks when holter monitor results are in  [place]. Sinead Hockman, [facility] [address] [place], [place] [address] [phone]

## 2017-02-15 ENCOUNTER — Ambulatory Visit (INDEPENDENT_AMBULATORY_CARE_PROVIDER_SITE_OTHER): Payer: 59

## 2017-02-15 DIAGNOSIS — I480 Paroxysmal atrial fibrillation: Secondary | ICD-10-CM

## 2017-04-25 ENCOUNTER — Telehealth (HOSPITAL_COMMUNITY): Payer: Self-pay | Admitting: *Deleted

## 2017-04-25 NOTE — Telephone Encounter (Signed)
Pt cld to switch from Xarelto to Eliquis since even with ins and copay card the Xarelto will now cost her $200 monthly.  She has a $10 copay card for Eliquis and would like to switch to that..  Per Roderic Palau, NP she will calculate dosage and make the switch.  Will call pt back with instructions once this is done.

## 2017-04-27 ENCOUNTER — Other Ambulatory Visit (HOSPITAL_COMMUNITY): Payer: Self-pay | Admitting: Nurse Practitioner

## 2017-05-02 ENCOUNTER — Other Ambulatory Visit (HOSPITAL_COMMUNITY): Payer: Self-pay | Admitting: *Deleted

## 2017-05-02 MED ORDER — APIXABAN 5 MG PO TABS: 5.0000 mg | ORAL_TABLET | Freq: Two times a day (BID) | ORAL | 6 refills | Status: DC

## 2017-06-01 ENCOUNTER — Other Ambulatory Visit: Payer: Self-pay | Admitting: Obstetrics and Gynecology

## 2017-06-01 DIAGNOSIS — Z1231 Encounter for screening mammogram for malignant neoplasm of breast: Secondary | ICD-10-CM

## 2017-07-04 ENCOUNTER — Other Ambulatory Visit (HOSPITAL_COMMUNITY): Payer: Self-pay | Admitting: Nurse Practitioner

## 2017-07-21 ENCOUNTER — Ambulatory Visit
Admission: RE | Admit: 2017-07-21 | Discharge: 2017-07-21 | Disposition: A | Discharge status: Home / Self Care | Payer: 59 | Source: Ambulatory Visit | Attending: Obstetrics and Gynecology | Admitting: Obstetrics and Gynecology

## 2017-07-21 DIAGNOSIS — Z1231 Encounter for screening mammogram for malignant neoplasm of breast: Secondary | ICD-10-CM

## 2017-07-24 ENCOUNTER — Other Ambulatory Visit: Payer: Self-pay | Admitting: Obstetrics and Gynecology

## 2017-07-24 DIAGNOSIS — R928 Other abnormal and inconclusive findings on diagnostic imaging of breast: Secondary | ICD-10-CM

## 2017-07-27 ENCOUNTER — Ambulatory Visit
Admission: RE | Admit: 2017-07-27 | Discharge: 2017-07-27 | Disposition: A | Discharge status: Home / Self Care | Payer: 59 | Source: Ambulatory Visit | Attending: Obstetrics and Gynecology | Admitting: Obstetrics and Gynecology

## 2017-07-27 ENCOUNTER — Ambulatory Visit: Payer: 59

## 2017-07-27 DIAGNOSIS — R928 Other abnormal and inconclusive findings on diagnostic imaging of breast: Secondary | ICD-10-CM

## 2017-07-28 ENCOUNTER — Other Ambulatory Visit: Payer: Self-pay | Admitting: Obstetrics and Gynecology

## 2017-07-28 ENCOUNTER — Ambulatory Visit
Admission: RE | Admit: 2017-07-28 | Discharge: 2017-07-28 | Disposition: A | Discharge status: Home / Self Care | Payer: 59 | Source: Ambulatory Visit | Attending: Obstetrics and Gynecology | Admitting: Obstetrics and Gynecology

## 2017-07-28 DIAGNOSIS — R928 Other abnormal and inconclusive findings on diagnostic imaging of breast: Secondary | ICD-10-CM

## 2017-07-28 DIAGNOSIS — N6310 Unspecified lump in the right breast, unspecified quadrant: Secondary | ICD-10-CM

## 2017-08-02 ENCOUNTER — Ambulatory Visit
Admission: RE | Admit: 2017-08-02 | Discharge: 2017-08-02 | Disposition: A | Discharge status: Home / Self Care | Payer: 59 | Source: Ambulatory Visit

## 2017-08-02 ENCOUNTER — Other Ambulatory Visit: Payer: Self-pay | Admitting: Obstetrics and Gynecology

## 2017-08-02 ENCOUNTER — Ambulatory Visit
Admission: RE | Admit: 2017-08-02 | Discharge: 2017-08-02 | Disposition: A | Discharge status: Home / Self Care | Payer: 59 | Source: Ambulatory Visit | Attending: Obstetrics and Gynecology | Admitting: Obstetrics and Gynecology

## 2017-08-02 ENCOUNTER — Other Ambulatory Visit: Payer: Self-pay | Admitting: Body Imaging

## 2017-08-02 DIAGNOSIS — N6310 Unspecified lump in the right breast, unspecified quadrant: Secondary | ICD-10-CM

## 2017-08-02 HISTORY — PX: BREAST BIOPSY: SHX20

## 2017-08-09 ENCOUNTER — Other Ambulatory Visit: Payer: Self-pay | Admitting: Surgery

## 2017-08-09 ENCOUNTER — Ambulatory Visit: Payer: Self-pay | Admitting: Surgery

## 2017-08-09 DIAGNOSIS — Z170 Estrogen receptor positive status [ER+]: Principal | ICD-10-CM

## 2017-08-09 DIAGNOSIS — C50411 Malignant neoplasm of upper-outer quadrant of right female breast: Secondary | ICD-10-CM

## 2017-08-09 NOTE — H&P (Signed)
Caroline [name] 08/09/2017 1:37 PM Location: [facility] Patient #: [id number] DOB: 04/29/1956 Married / Language: English / Race: White Female  History of Present Illness (Caroline [name] A. Jasan Doughtie MD; 08/09/2017 3:36 PM) Patient words: Patient sent at the request of Dr. Michelle [name] for mammographic abnormality picked up on screening mammogram. The patient was found to have a right upper-outer quadrant breast mass. There were 2 masses in the right outer breast. Total area measured 2 cm. These were felt to be too discrete masses. 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 She was ER positive PR positive HER-2/neu negative. She had no symptoms prior to her mammogram. Patient denied any history of breast pain nipple discharge or breast mass.                      CLINICAL DATA: 61-year-old female for further evaluation of possible right breast distortion on screening mammogram. EXAM: 2D DIGITAL DIAGNOSTIC RIGHT MAMMOGRAM WITH ADJUNCT TOMO ULTRASOUND RIGHT BREAST COMPARISON: Previous exam(s). ACR Breast Density Category c: The breast tissue is heterogeneously dense, which may obscure small masses. FINDINGS: 2D and 3D spot compression views the right breast demonstrate an area of distortion within the posterior outer right breast. On physical exam, focal thickening at the 8:30 position of the right breast 4 cm from the nipple noted. Targeted ultrasound is performed, showing 3 separate irregular hypoechoic areas in the outer right breast 4-5 cm from the nipple as follows: An 8 x 9 x 7 mm irregular hypoechoic area with distortion at the 8:30 position 4 cm from the nipple. A 6 x 6 x 7 mm irregular hypoechoic area at the 8:30 position 5 cm from the nipple, and lies 7 mm from the larger lesion above. A 5 x 3 x 5 mm irregular hypoechoic area at the 9 o'clock position 5 cm from the nipple, and lies 5 mm from the other areas. The 3  irregular hypoechoic areas encompass a region measuring 2.1 cm in greatest diameter. No abnormal right axillary lymph nodes are identified. IMPRESSION: Three suspicious irregular hypoechoic areas within the outer right breast, corresponding to the area of mammographic distortion, with the entire area measuring 2.1 cm in greatest diameter. Tissue sampling is recommended. No abnormal right axillary lymph nodes. RECOMMENDATION: Ultrasound-guided right breast biopsies of the largest lesion and 1 of the other smaller lesions (preferably furthest away). These biopsies have been scheduled for 08/02/2017 and the patient informed. I have discussed the findings and recommendations with the patient. Results were also provided in writing at the conclusion of the visit. If applicable, a reminder letter will be sent to the patient regarding the next appointment. BI-RADS CATEGORY 4: Suspicious. Electronically Signed By: Caroline [name] [name].D. On: 07/28/2017 11:51     US BREAST LTD UNI RIGHT INC AXILLA (Order [id number]) - Reflex for Order [id number]               ADDITIONAL INFORMATION: 1. PROGNOSTIC INDICATORS Results: IMMUNOHISTOCHEMICAL AND MORPHOMETRIC ANALYSIS PERFORMED MANUALLY Estrogen Receptor: 90%, POSITIVE, STRONG STAINING INTENSITY Progesterone Receptor: 20%, POSITIVE, STRONG STAINING INTENSITY Proliferation Marker Ki67: 15% REFERENCE RANGE ESTROGEN RECEPTOR NEGATIVE 0% POSITIVE =>1% REFERENCE RANGE PROGESTERONE RECEPTOR NEGATIVE 0% POSITIVE =>1% All controls stained appropriately Caroline PATRICK MD Pathologist, Electronic Signature ( Signed 08/08/2017) 1. FLUORESCENCE IN-SITU HYBRIDIZATION Results: HER2 - NEGATIVE RATIO OF HER2/CEP17 SIGNALS 1.00 AVERAGE HER2 COPY NUMBER PER CELL 1.05 Reference Range: NEGATIVE HER2/CEP17 Ratio <2.0 and average HER2 copy number <4.0 EQUIVOCAL HER2/CEP17 Ratio <  2.0 and average HER2 copy number >=4.0 and <6.0 1 of 3 FINAL for  Caroline [name] ([id number]) ADDITIONAL INFORMATION:(continued) POSITIVE HER2/CEP17 Ratio >=2.0 or <2.0 and average HER2 copy number >=6.0 Caroline MANNY MD Pathologist, Electronic Signature ( Signed 08/04/2017) FINAL DIAGNOSIS Diagnosis 1. 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 - LYMPHOVASCULAR INVASION PRESENT. 2. Breast, right, needle core biopsy, 8:30 o'clock; 5CMFN - INVASIVE DUCTAL CARCINOMA. Microscopic Comment 1. The carcinoma appears grade 2. Parts #1 and #2 have a similar morphology. Prognostic markers will be ordered. Dr. Kish has reviewed the case. The case was called to [name] on 08/03/2017. Caroline MANNY MD Pathologist, Electronic Signature (Case signed 08/03/2017) Specimen Gross and Clinical Information Specimen Comment 1. Time in formalin: 15:20 PM Specimen(s) Obtained: 1. Breast, right, needle core biopsy, 8:30 o'clock, 4CMFN 2. Breast, right, needle core biopsy, 8:30 o'clock; 5CMFN Specimen Clinical Information 1. Mass suspicious for malignancy 2. Mass - possible FCC, R/O malignancy Gross 1. Received labeled as "Caroline [name]" and "Rt breast 830 4cm" ( TIF 1520 CIT ?) are 4 cores of yellow to gray white soft to firm tissue, ranging from 0.4 x 0.1 x 0.1 cm to 1.2 x 0.1 x 0.1 cm. One block submitted. (SSW 8/8) 2. Received labeled as "Caroline [name]" and "Rt breast 830 5cm" ( TIF 1525 CIT ?) are 4 cores of yellow to gray white soft to firm tissue, which average 0.9 x 0.1 x 0.1 cm. One block submitted. (SSW 8/8) Stain(s) used in Diagnosis: The following stain(s) were used in diagnosing the case: Her2 FISH, ER-ACIS, PR-ACIS, KI-67-ACIS. The control(s) stained appropriately. 2 of 3 FINAL for Caroline [name] ([id number]) Disclaimer Estrogen receptor (6F11), immunohistochemical stains are performed on formalin fixed, paraffin embedded tissue using a  3,3"-diaminobenzidine (DAB) chromogen and Leica Bond Autostainer System. The staining intensity of the nucleus is scored manually and is reported as the percentage of tumor cell nuclei demonstrating specific nuclear staining.Specimens are fixed in 10% Neutral Buffered Formalin for at least 6 hours and up to 72 hours. These tests have not be validated on decalcified tissue. Results should be interpreted with caution given the possibility of false negative results on decalcified specimens. HER2 IQFISH pharmDX (code K5731) is a direct fluorescence in-situ hybridization assay designed to quantitatively determine HER2 gene amplification in formalin-fixed, paraffin-embedded tissue specimens. It is performed at [facility] and is reported using ASCO/CAP scoring criteria published in 2013. Ki-67 (MM1), immunohistochemical stains are performed on formalin fixed, paraffin embedded tissue using a 3,3"-diaminobenzidine (DAB) chromogen and Leica Bond Autostainer System. The staining intensity of the nucleus is scored manually and is reported as the percentage of tumor cell nuclei demonstrating specific nuclear staining.Specimens are fixed in 10% Neutral Buffered Formalin for at least 6 hours and up to 72 hours. These tests have not be validated on decalcified tissue. Results should be interpreted with caution given the possibility of false negative results on decalcified specimens. PR progesterone receptor (16), immunohistochemical stains are performed on formalin fixed, paraffin embedded tissue using a 3,3"-diaminobenzidine (DAB) chromogen and Leica Bond Autostainer System. The staining intensity of the nucleus is scored manually and is reported as the percentage of tumor cell nuclei demonstrating specific nuclear staining.Specimens are fixed in 10% Neutral Buffered Formalin for at least 6 hours and up to 72 hours. These tests have not be validated on decalcified tissue. Results should be interpreted with  caution given the possibility of false negative   results on decalcified specimens. Report signed out from the following location(s) Technical Component was performed at [facility] [address],[place] [address].CLIA:[id number],CAP:[id number]., Interpretation was performed at [place] [address], [place], [place] [address]. CLIA #: [id number], 3 of.  The patient is a 61 year old female.   Past Surgical History (Tanisha A. Brown, RMA; 08/09/2017 1:38 PM) Cesarean Section - Multiple Hemorrhoidectomy  Diagnostic Studies History (Tanisha A. Brown, RMA; 08/09/2017 1:38 PM) Colonoscopy 1-5 years ago Mammogram within last year Pap Smear 1-5 years ago  Allergies (Tanisha A. Brown, RMA; 08/09/2017 1:40 PM) Sulfa Antibiotics Allergies Reconciled  Medication History (Tanisha A. Brown, RMA; 08/09/2017 1:40 PM) BuPROPion HCl ER (XL) (150MG Tablet ER 24HR, Oral) Active. Eliquis (5MG Tablet, Oral) Active. Fenofibrate (160MG Tablet, Oral) Active. Hydrocodone-Acetaminophen (5-325MG Tablet, Oral) Active. Valsartan-Hydrochlorothiazide (160-12.5MG Tablet, Oral) Active. Medications Reconciled  Social History (Tanisha A. Brown, RMA; 08/09/2017 1:38 PM) Alcohol use Occasional alcohol use. No drug use  Family History (Tanisha A. Brown, RMA; 08/09/2017 1:38 PM) Arthritis Brother, Mother.  Pregnancy / Birth History (Tanisha A. Brown, RMA; 08/09/2017 1:38 PM) Age at menarche 13 years. Age of menopause 46-50 Contraceptive History Oral contraceptives. Gravida 3 Maternal age 31-35 Para 2  Other Problems (Tanisha A. Brown, RMA; 08/09/2017 1:38 PM) Arthritis Breast Cancer High blood pressure     Review of Systems (Tanisha A. Brown RMA; 08/09/2017 1:38 PM) General Not Present- Appetite Loss, Chills, Fatigue, Fever, Night Sweats, Weight Gain and Weight Loss. Skin Not Present- Change in Wart/Mole, Dryness, Hives, Jaundice, New  Lesions, Non-Healing Wounds, Rash and Ulcer. HEENT Not Present- Earache, Hearing Loss, Hoarseness, Nose Bleed, Oral Ulcers, Ringing in the Ears, Seasonal Allergies, Sinus Pain, Sore Throat, Visual Disturbances, Wears glasses/contact lenses and Yellow Eyes. Respiratory Not Present- Bloody sputum, Chronic Cough, Difficulty Breathing, Snoring and Wheezing. Breast Present- Breast Mass. Not Present- Breast Pain, Nipple Discharge and Skin Changes. Cardiovascular Not Present- Chest Pain, Difficulty Breathing Lying Down, Leg Cramps, Palpitations, Rapid Heart Rate, Shortness of Breath and Swelling of Extremities. Female Genitourinary Not Present- Frequency, Nocturia, Painful Urination, Pelvic Pain and Urgency. Musculoskeletal Present- Joint Pain. Not Present- Back Pain, Joint Stiffness, Muscle Pain, Muscle Weakness and Swelling of Extremities. Neurological Not Present- Decreased Memory, Fainting, Headaches, Numbness, Seizures, Tingling, Tremor, Trouble walking and Weakness. Hematology Not Present- Blood Thinners, Easy Bruising, Excessive bleeding, Gland problems, HIV and Persistent Infections.  Vitals (Tanisha A. Brown RMA; 08/09/2017 1:39 PM) 08/09/2017 1:38 PM Weight: 168 lb Height: 64in Body Surface Area: 1.82 [name] Body Mass Index: 28.84 kg/[name]  Temp.: 97.8F  Pulse: 104 (Regular)  P.OX: 96% (Room air) BP: 118/78 (Sitting, Left Arm, Standard)      Physical Exam (Taisia Fantini A. Deyani Hegarty MD; 08/09/2017 3:36 PM)  General Mental Status-Alert. General Appearance-Consistent with stated age. Hydration-Well hydrated. Voice-Normal.  Eye Eyeball - Bilateral-Extraocular movements intact. Sclera/Conjunctiva - Bilateral-No scleral icterus.  Chest and Lung Exam Chest and lung exam reveals -quiet, even and easy respiratory effort with no use of accessory muscles and on auscultation, normal breath sounds, no adventitious sounds and normal vocal resonance. Inspection Chest Wall - Normal.  Back - normal.  Breast Note: Mild bruising right breast outer quadrant and central quadrant. Small hematoma. Left breast normal.  Musculoskeletal Normal Exam - Left-Upper Extremity Strength Normal and Lower Extremity Strength Normal. Normal Exam - Right-Upper Extremity Strength Normal and Lower Extremity Strength Normal.  Lymphatic Head & Neck  General Head & Neck Lymphatics: Bilateral - Description - Normal. Axillary  General   Axillary Region: Bilateral - Description - Normal. Tenderness - Non Tender.    Assessment & Plan (Kazden Largo A. Aubriel Khanna MD; 08/09/2017 3:36 PM)  BREAST CANCER, RIGHT (C50.911) Impression: Patient has opted for right breast lumpectomy with sentinel lymph node mapping. I discussed procedure as well as alternatives to breast conservation and potential other treatments required with breast conservation to include radiation chemotherapy. I will refer her to medical and radiation oncology. Risk of lumpectomy include bleeding, infection, seroma, more surgery, use of seed/wire, wound care, cosmetic deformity and the need for other treatments, death , blood clots, death. Pt agrees to proceed. Risk of sentinel lymph node mapping include bleeding, infection, lymphedema, shoulder pain. stiffness, dye allergy. cosmetic deformity , blood clots, death, need for more surgery. Pt agres to proceed.  Current Plans You are being scheduled for surgery- Our schedulers will call you.  You should hear from our office's scheduling department within 5 working days about the location, date, and time of surgery. We try to make accommodations for patient's preferences in scheduling surgery, but sometimes the OR schedule or the surgeon's schedule prevents [place] from making those accommodations.  If you have not heard from our office [phone]) in 5 working days, call the office and ask for your surgeon's nurse.  If you have other questions about your diagnosis, plan, or surgery, call the  office and ask for your surgeon's nurse.  Pt Education - [facility] Breast Cancer Information Given - [facility] "Breast Journey" Package Pt Education - Pamphlet Given - Breast Biopsy: discussed with patient and provided information. We discussed the staging and pathophysiology of breast cancer. We discussed all of the different options for treatment for breast cancer including surgery, chemotherapy, radiation therapy, Herceptin, and antiestrogen therapy. We discussed a sentinel lymph node biopsy as she does not appear to having lymph node involvement right now. We discussed the performance of that with injection of radioactive tracer and blue dye. We discussed that she would have an incision underneath her axillary hairline. We discussed that there is a bout a 10-20% chance of having a positive node with a sentinel lymph node biopsy and we will await the permanent pathology to make any other first further decisions in terms of her treatment. One of these options might be to return to the operating room to perform an axillary lymph node dissection. We discussed about a 1-2% risk lifetime of chronic shoulder pain as well as lymphedema associated with a sentinel lymph node biopsy. We discussed the options for treatment of the breast cancer which included lumpectomy versus a mastectomy. We discussed the performance of the lumpectomy with a wire placement. We discussed a 10-20% chance of a positive margin requiring reexcision in the operating room. We also discussed that she may need radiation therapy or antiestrogen therapy or both if she undergoes lumpectomy. We discussed the mastectomy and the postoperative care for that as well. We discussed that there is no difference in her survival whether she undergoes lumpectomy with radiation therapy or antiestrogen therapy versus a mastectomy. There is a slight difference in the local recurrence rate being 3-5% with lumpectomy and about 1% with a mastectomy. We discussed the  risks of operation including bleeding, infection, possible reoperation. She understands her further therapy will be based on what her stages at the time of her operation.  Pt Education - [facility] breast cancer surgery: discussed with patient and provided information. Pt Education - ABC (After Breast Cancer) Class Info: discussed with patient and provided information.

## 2017-08-11 ENCOUNTER — Encounter (HOSPITAL_BASED_OUTPATIENT_CLINIC_OR_DEPARTMENT_OTHER): Payer: Self-pay | Admitting: *Deleted

## 2017-08-11 ENCOUNTER — Encounter (HOSPITAL_BASED_OUTPATIENT_CLINIC_OR_DEPARTMENT_OTHER)
Admission: RE | Admit: 2017-08-11 | Discharge: 2017-08-11 | Disposition: A | Discharge status: Home / Self Care | Payer: 59 | Source: Ambulatory Visit | Attending: Surgery | Admitting: Surgery

## 2017-08-11 DIAGNOSIS — Z01812 Encounter for preprocedural laboratory examination: Secondary | ICD-10-CM | POA: Diagnosis not present

## 2017-08-11 DIAGNOSIS — C50911 Malignant neoplasm of unspecified site of right female breast: Secondary | ICD-10-CM | POA: Diagnosis not present

## 2017-08-11 LAB — BASIC METABOLIC PANEL
Anion gap: 9 (ref 5–15)
BUN: 33 mg/dL — ABNORMAL HIGH (ref 6–20)
CALCIUM: 9.7 mg/dL (ref 8.9–10.3)
CO2: 32 mmol/L (ref 22–32)
CREATININE: 1.78 mg/dL — AB (ref 0.44–1.00)
Chloride: 96 mmol/L — ABNORMAL LOW (ref 101–111)
GFR calc non Af Amer: 30 mL/min — ABNORMAL LOW (ref >60)
GFR, EST AFRICAN AMERICAN: 35 mL/min — AB (ref >60)
Glucose, Bld: 121 mg/dL — ABNORMAL HIGH (ref 65–99)
Potassium: 4.1 mmol/L (ref 3.5–5.1)
SODIUM: 137 mmol/L (ref 135–145)

## 2017-08-11 NOTE — Progress Notes (Signed)
In for labwork. NPO after midnight except Ensure pre-surgery given with instructions to complete by 0445 day of surgery. Pt verbalized understanding.

## 2017-08-14 ENCOUNTER — Ambulatory Visit
Admission: RE | Admit: 2017-08-14 | Discharge: 2017-08-14 | Disposition: A | Discharge status: Home / Self Care | Payer: 59 | Source: Ambulatory Visit | Attending: Surgery | Admitting: Surgery

## 2017-08-14 ENCOUNTER — Other Ambulatory Visit: Payer: Self-pay | Admitting: Surgery

## 2017-08-14 ENCOUNTER — Encounter: Payer: Self-pay | Admitting: Radiation Oncology

## 2017-08-14 DIAGNOSIS — C50411 Malignant neoplasm of upper-outer quadrant of right female breast: Secondary | ICD-10-CM

## 2017-08-14 DIAGNOSIS — Z170 Estrogen receptor positive status [ER+]: Principal | ICD-10-CM

## 2017-08-14 NOTE — Progress Notes (Signed)
Lab results reviewed by Dr. Sabra Heck, will proceed with surgery as scheduled.

## 2017-08-17 ENCOUNTER — Ambulatory Visit (HOSPITAL_BASED_OUTPATIENT_CLINIC_OR_DEPARTMENT_OTHER)
Admission: RE | Admit: 2017-08-17 | Discharge: 2017-08-17 | Disposition: A | Discharge status: Home / Self Care | Payer: 59 | Source: Ambulatory Visit | Attending: Surgery | Admitting: Surgery

## 2017-08-17 ENCOUNTER — Ambulatory Visit
Admission: RE | Admit: 2017-08-17 | Discharge: 2017-08-17 | Disposition: A | Discharge status: Home / Self Care | Payer: 59 | Source: Ambulatory Visit | Attending: Surgery | Admitting: Surgery

## 2017-08-17 ENCOUNTER — Encounter (HOSPITAL_COMMUNITY)
Admission: RE | Admit: 2017-08-17 | Discharge: 2017-08-17 | Disposition: A | Discharge status: Home / Self Care | Payer: 59 | Source: Ambulatory Visit | Attending: Surgery | Admitting: Surgery

## 2017-08-17 ENCOUNTER — Encounter (HOSPITAL_BASED_OUTPATIENT_CLINIC_OR_DEPARTMENT_OTHER)
Admission: RE | Disposition: A | Discharge status: Home / Self Care | Payer: Self-pay | Source: Ambulatory Visit | Attending: Surgery

## 2017-08-17 ENCOUNTER — Ambulatory Visit (HOSPITAL_BASED_OUTPATIENT_CLINIC_OR_DEPARTMENT_OTHER): Payer: 59 | Admitting: Registered Nurse

## 2017-08-17 ENCOUNTER — Encounter (HOSPITAL_BASED_OUTPATIENT_CLINIC_OR_DEPARTMENT_OTHER): Payer: Self-pay

## 2017-08-17 DIAGNOSIS — I10 Essential (primary) hypertension: Secondary | ICD-10-CM | POA: Insufficient documentation

## 2017-08-17 DIAGNOSIS — M1990 Unspecified osteoarthritis, unspecified site: Secondary | ICD-10-CM | POA: Diagnosis not present

## 2017-08-17 DIAGNOSIS — C773 Secondary and unspecified malignant neoplasm of axilla and upper limb lymph nodes: Secondary | ICD-10-CM | POA: Insufficient documentation

## 2017-08-17 DIAGNOSIS — Z7901 Long term (current) use of anticoagulants: Secondary | ICD-10-CM | POA: Insufficient documentation

## 2017-08-17 DIAGNOSIS — Z170 Estrogen receptor positive status [ER+]: Secondary | ICD-10-CM | POA: Insufficient documentation

## 2017-08-17 DIAGNOSIS — I4891 Unspecified atrial fibrillation: Secondary | ICD-10-CM | POA: Insufficient documentation

## 2017-08-17 DIAGNOSIS — C50411 Malignant neoplasm of upper-outer quadrant of right female breast: Secondary | ICD-10-CM

## 2017-08-17 DIAGNOSIS — C50511 Malignant neoplasm of lower-outer quadrant of right female breast: Secondary | ICD-10-CM | POA: Insufficient documentation

## 2017-08-17 DIAGNOSIS — Z79899 Other long term (current) drug therapy: Secondary | ICD-10-CM | POA: Insufficient documentation

## 2017-08-17 DIAGNOSIS — F17210 Nicotine dependence, cigarettes, uncomplicated: Secondary | ICD-10-CM | POA: Insufficient documentation

## 2017-08-17 DIAGNOSIS — Z79891 Long term (current) use of opiate analgesic: Secondary | ICD-10-CM | POA: Insufficient documentation

## 2017-08-17 HISTORY — PX: BREAST LUMPECTOMY WITH RADIOACTIVE SEED AND SENTINEL LYMPH NODE BIOPSY: SHX6550

## 2017-08-17 HISTORY — DX: Unspecified osteoarthritis, unspecified site: M19.90

## 2017-08-17 HISTORY — PX: BREAST LUMPECTOMY: SHX2

## 2017-08-17 LAB — POCT HEMOGLOBIN-HEMACUE: HEMOGLOBIN: 11.2 g/dL — AB (ref 12.0–15.0)

## 2017-08-17 SURGERY — BREAST LUMPECTOMY WITH RADIOACTIVE SEED AND SENTINEL LYMPH NODE BIOPSY
Anesthesia: General | Site: Breast | Laterality: Right

## 2017-08-17 MED ORDER — PROMETHAZINE HCL 25 MG/ML IJ SOLN: 6.2500 mg | INTRAMUSCULAR | Status: DC | PRN

## 2017-08-17 MED ORDER — ONDANSETRON HCL 4 MG/2ML IJ SOLN
INTRAMUSCULAR | Status: AC
  Filled 2017-08-17: qty 2

## 2017-08-17 MED ORDER — DEXAMETHASONE SODIUM PHOSPHATE 10 MG/ML IJ SOLN
INTRAMUSCULAR | Status: AC
  Filled 2017-08-17: qty 1

## 2017-08-17 MED ORDER — CHLORHEXIDINE GLUCONATE CLOTH 2 % EX PADS: 6.0000 | MEDICATED_PAD | Freq: Once | CUTANEOUS | Status: DC

## 2017-08-17 MED ORDER — LIDOCAINE 2% (20 MG/ML) 5 ML SYRINGE
INTRAMUSCULAR | Status: DC | PRN
  Administered 2017-08-17: 100 mg via INTRAVENOUS

## 2017-08-17 MED ORDER — BUPIVACAINE-EPINEPHRINE (PF) 0.5% -1:200000 IJ SOLN
INTRAMUSCULAR | Status: DC | PRN
  Administered 2017-08-17: 30 mL via PERINEURAL

## 2017-08-17 MED ORDER — CEFAZOLIN SODIUM-DEXTROSE 2-4 GM/100ML-% IV SOLN
INTRAVENOUS | Status: AC
  Filled 2017-08-17: qty 100

## 2017-08-17 MED ORDER — MIDAZOLAM HCL 2 MG/2ML IJ SOLN
1.0000 mg | INTRAMUSCULAR | Status: DC | PRN
  Administered 2017-08-17: 2 mg via INTRAVENOUS

## 2017-08-17 MED ORDER — GABAPENTIN 300 MG PO CAPS
ORAL_CAPSULE | ORAL | Status: AC
  Filled 2017-08-17: qty 1

## 2017-08-17 MED ORDER — CEFAZOLIN SODIUM-DEXTROSE 2-4 GM/100ML-% IV SOLN
2.0000 g | Freq: Once | INTRAVENOUS | Status: AC
  Administered 2017-08-17: 2 g via INTRAVENOUS

## 2017-08-17 MED ORDER — MIDAZOLAM HCL 2 MG/2ML IJ SOLN
INTRAMUSCULAR | Status: AC
Start: 2017-08-17 — End: 2017-08-17
  Filled 2017-08-17: qty 2

## 2017-08-17 MED ORDER — PROPOFOL 10 MG/ML IV BOLUS
INTRAVENOUS | Status: DC | PRN
  Administered 2017-08-17: 150 mg via INTRAVENOUS
  Administered 2017-08-17: 30 mg via INTRAVENOUS
  Administered 2017-08-17: 20 mg via INTRAVENOUS

## 2017-08-17 MED ORDER — FENTANYL CITRATE (PF) 100 MCG/2ML IJ SOLN
50.0000 ug | INTRAMUSCULAR | Status: DC | PRN
  Administered 2017-08-17 (×2): 50 ug via INTRAVENOUS

## 2017-08-17 MED ORDER — SCOPOLAMINE 1 MG/3DAYS TD PT72
MEDICATED_PATCH | TRANSDERMAL | Status: DC | PRN
  Administered 2017-08-17: 1 via TRANSDERMAL

## 2017-08-17 MED ORDER — FENTANYL CITRATE (PF) 100 MCG/2ML IJ SOLN
INTRAMUSCULAR | Status: AC
  Filled 2017-08-17: qty 2

## 2017-08-17 MED ORDER — LACTATED RINGERS IV SOLN
INTRAVENOUS | Status: DC
  Administered 2017-08-17 (×2): via INTRAVENOUS

## 2017-08-17 MED ORDER — LIDOCAINE 2% (20 MG/ML) 5 ML SYRINGE
INTRAMUSCULAR | Status: AC
Start: 2017-08-17 — End: 2017-08-17
  Filled 2017-08-17: qty 5

## 2017-08-17 MED ORDER — DEXTROSE 5 % IV SOLN: 3.0000 g | INTRAVENOUS | Status: DC

## 2017-08-17 MED ORDER — EPHEDRINE SULFATE-NACL 50-0.9 MG/10ML-% IV SOSY
PREFILLED_SYRINGE | INTRAVENOUS | Status: DC | PRN
  Administered 2017-08-17 (×2): 10 mg via INTRAVENOUS
  Administered 2017-08-17: 20 mg via INTRAVENOUS

## 2017-08-17 MED ORDER — ACETAMINOPHEN 500 MG PO TABS
1000.0000 mg | ORAL_TABLET | ORAL | Status: AC
Start: 2017-08-17 — End: 2017-08-17
  Administered 2017-08-17: 1000 mg via ORAL

## 2017-08-17 MED ORDER — OXYCODONE HCL 5 MG PO TABS: 5.0000 mg | ORAL_TABLET | Freq: Four times a day (QID) | ORAL | 0 refills | Status: DC | PRN

## 2017-08-17 MED ORDER — SCOPOLAMINE 1 MG/3DAYS TD PT72: 1.0000 | MEDICATED_PATCH | Freq: Once | TRANSDERMAL | Status: DC | PRN

## 2017-08-17 MED ORDER — PROPOFOL 10 MG/ML IV BOLUS
INTRAVENOUS | Status: AC
Start: 2017-08-17 — End: 2017-08-17
  Filled 2017-08-17: qty 20

## 2017-08-17 MED ORDER — GABAPENTIN 300 MG PO CAPS
300.0000 mg | ORAL_CAPSULE | ORAL | Status: AC
  Administered 2017-08-17: 300 mg via ORAL

## 2017-08-17 MED ORDER — SCOPOLAMINE 1 MG/3DAYS TD PT72
MEDICATED_PATCH | TRANSDERMAL | Status: AC
Start: 2017-08-17 — End: 2017-08-17
  Filled 2017-08-17: qty 1

## 2017-08-17 MED ORDER — EPHEDRINE 5 MG/ML INJ
INTRAVENOUS | Status: AC
  Filled 2017-08-17: qty 10

## 2017-08-17 MED ORDER — 0.9 % SODIUM CHLORIDE (POUR BTL) OPTIME
TOPICAL | Status: DC | PRN
  Administered 2017-08-17: 1000 mL

## 2017-08-17 MED ORDER — IBUPROFEN 800 MG PO TABS: 800.0000 mg | ORAL_TABLET | Freq: Three times a day (TID) | ORAL | 0 refills | Status: DC | PRN

## 2017-08-17 MED ORDER — BUPIVACAINE-EPINEPHRINE (PF) 0.25% -1:200000 IJ SOLN
INTRAMUSCULAR | Status: DC | PRN
Start: 2017-08-17 — End: 2017-08-17
  Administered 2017-08-17: 20 mL

## 2017-08-17 MED ORDER — MIDAZOLAM HCL 2 MG/2ML IJ SOLN
INTRAMUSCULAR | Status: AC
  Filled 2017-08-17: qty 2

## 2017-08-17 MED ORDER — FENTANYL CITRATE (PF) 100 MCG/2ML IJ SOLN
INTRAMUSCULAR | Status: DC | PRN
  Administered 2017-08-17: 50 ug via INTRAVENOUS

## 2017-08-17 MED ORDER — DEXAMETHASONE SODIUM PHOSPHATE 10 MG/ML IJ SOLN
INTRAMUSCULAR | Status: DC | PRN
  Administered 2017-08-17: 10 mg via INTRAVENOUS

## 2017-08-17 MED ORDER — ACETAMINOPHEN 500 MG PO TABS
ORAL_TABLET | ORAL | Status: AC
  Filled 2017-08-17: qty 2

## 2017-08-17 MED ORDER — FENTANYL CITRATE (PF) 100 MCG/2ML IJ SOLN
25.0000 ug | INTRAMUSCULAR | Status: DC | PRN
  Administered 2017-08-17 (×3): 25 ug via INTRAVENOUS

## 2017-08-17 MED ORDER — TECHNETIUM TC 99M SULFUR COLLOID FILTERED: 1.0000 | Freq: Once | INTRAVENOUS | Status: DC | PRN

## 2017-08-17 MED ORDER — ONDANSETRON HCL 4 MG/2ML IJ SOLN
INTRAMUSCULAR | Status: DC | PRN
  Administered 2017-08-17: 4 mg via INTRAVENOUS

## 2017-08-17 SURGICAL SUPPLY — 43 items
ADH SKN CLS APL DERMABOND .7 (GAUZE/BANDAGES/DRESSINGS) ×1
APPLIER CLIP 9.375 MED OPEN (MISCELLANEOUS) ×3
APR CLP MED 9.3 20 MLT OPN (MISCELLANEOUS) ×1
BINDER BREAST XLRG (GAUZE/BANDAGES/DRESSINGS) ×2 IMPLANT
BLADE SURG 15 STRL LF DISP TIS (BLADE) ×1 IMPLANT
BLADE SURG 15 STRL SS (BLADE) ×3
CANISTER SUC SOCK COL 7IN (MISCELLANEOUS) IMPLANT
CANISTER SUCT 1200ML W/VALVE (MISCELLANEOUS) ×3 IMPLANT
CHLORAPREP W/TINT 26ML (MISCELLANEOUS) ×3 IMPLANT
CLIP APPLIE 9.375 MED OPEN (MISCELLANEOUS) ×1 IMPLANT
COVER BACK TABLE 60X90IN (DRAPES) ×3 IMPLANT
COVER MAYO STAND STRL (DRAPES) ×3 IMPLANT
COVER PROBE W GEL 5X96 (DRAPES) ×3 IMPLANT
DECANTER SPIKE VIAL GLASS SM (MISCELLANEOUS) IMPLANT
DERMABOND ADVANCED (GAUZE/BANDAGES/DRESSINGS) ×2
DERMABOND ADVANCED .7 DNX12 (GAUZE/BANDAGES/DRESSINGS) ×1 IMPLANT
DEVICE DUBIN W/COMP PLATE 8390 (MISCELLANEOUS) ×3 IMPLANT
DRAPE LAPAROSCOPIC ABDOMINAL (DRAPES) ×3 IMPLANT
DRAPE UTILITY XL STRL (DRAPES) ×3 IMPLANT
ELECT COATED BLADE 2.86 ST (ELECTRODE) ×3 IMPLANT
ELECT REM PT RETURN 9FT ADLT (ELECTROSURGICAL) ×3
ELECTRODE REM PT RTRN 9FT ADLT (ELECTROSURGICAL) ×1 IMPLANT
GLOVE BIOGEL PI IND STRL 8 (GLOVE) ×1 IMPLANT
GLOVE BIOGEL PI INDICATOR 8 (GLOVE) ×2
GLOVE ECLIPSE 8.0 STRL XLNG CF (GLOVE) ×3 IMPLANT
GOWN STRL REUS W/ TWL LRG LVL3 (GOWN DISPOSABLE) ×2 IMPLANT
GOWN STRL REUS W/TWL LRG LVL3 (GOWN DISPOSABLE) ×6
KIT MARKER MARGIN INK (KITS) ×3 IMPLANT
NDL HYPO 25X1 1.5 SAFETY (NEEDLE) ×1 IMPLANT
NEEDLE HYPO 25X1 1.5 SAFETY (NEEDLE) ×3 IMPLANT
NS IRRIG 1000ML POUR BTL (IV SOLUTION) ×3 IMPLANT
PACK BASIN DAY SURGERY FS (CUSTOM PROCEDURE TRAY) ×3 IMPLANT
PENCIL BUTTON HOLSTER BLD 10FT (ELECTRODE) ×3 IMPLANT
SLEEVE SCD COMPRESS KNEE MED (MISCELLANEOUS) ×3 IMPLANT
SPONGE LAP 4X18 X RAY DECT (DISPOSABLE) ×3 IMPLANT
SUT MNCRL AB 4-0 PS2 18 (SUTURE) ×5 IMPLANT
SUT VICRYL 3-0 CR8 SH (SUTURE) ×3 IMPLANT
SYR CONTROL 10ML LL (SYRINGE) ×3 IMPLANT
TOWEL OR 17X24 6PK STRL BLUE (TOWEL DISPOSABLE) ×3 IMPLANT
TOWEL OR NON WOVEN STRL DISP B (DISPOSABLE) ×3 IMPLANT
TUBE CONNECTING 20'X1/4 (TUBING) ×1
TUBE CONNECTING 20X1/4 (TUBING) ×2 IMPLANT
YANKAUER SUCT BULB TIP NO VENT (SUCTIONS) ×3 IMPLANT

## 2017-08-17 NOTE — Anesthesia Procedure Notes (Signed)
Anesthesia Regional Block: Pectoralis block   Pre-Anesthetic Checklist: ,, timeout performed, Correct Patient, Correct Site, Correct Laterality, Correct Procedure, Correct Position, site marked, Risks and benefits discussed,  Surgical consent,  Pre-op evaluation,  At surgeon's request and post-op pain management  Laterality: Right  Prep: chloraprep       Needles:  Injection technique: Single-shot  Needle Type: Echogenic Needle     Needle Length: 9cm  Needle Gauge: 21     Additional Needles:   Procedures: ultrasound guided,,,,,,,,  Narrative:  Start time: 08/17/2017 8:10 AM End time: 08/17/2017 8:15 AM Injection made incrementally with aspirations every 5 mL.  Performed by: Personally  Anesthesiologist: Catalina Gravel  Additional Notes: No pain on injection. No increased resistance to injection. Injection made in 5cc increments.  Good needle visualization.  Patient tolerated procedure well.

## 2017-08-17 NOTE — Progress Notes (Signed)
Assisted nucmed with nuc med inj   Side rails up, monitors on throughout procedure. See vital signs in flow sheet. Tolerated Procedure well.

## 2017-08-17 NOTE — Discharge Instructions (Signed)
°Post Anesthesia Home Care Instructions ° °Activity: °Get plenty of rest for the remainder of the day. A responsible individual must stay with you for 24 hours following the procedure.  °For the next 24 hours, DO NOT: °-Drive a car °-Operate machinery °-Drink alcoholic beverages °-Take any medication unless instructed by your physician °-Make any legal decisions or sign important papers. ° °Meals: °Start with liquid foods such as gelatin or soup. Progress to regular foods as tolerated. Avoid greasy, spicy, heavy foods. If nausea and/or vomiting occur, drink only clear liquids until the nausea and/or vomiting subsides. Call your physician if vomiting continues. ° °Special Instructions/Symptoms: °Your throat may feel dry or sore from the anesthesia or the breathing tube placed in your throat during surgery. If this causes discomfort, gargle with warm salt water. The discomfort should disappear within 24 hours. ° °If you had a scopolamine patch placed behind your ear for the management of post- operative nausea and/or vomiting: ° °1. The medication in the patch is effective for 72 hours, after which it should be removed.  Wrap patch in a tissue and discard in the trash. Wash hands thoroughly with soap and water. °2. You may remove the patch earlier than 72 hours if you experience unpleasant side effects which may include dry mouth, dizziness or visual disturbances. °3. Avoid touching the patch. Wash your hands with soap and water after contact with the patch. °  ° ° ° ° °[facility] °Office Phone Number [phone] ° °BREAST BIOPSY/ PARTIAL MASTECTOMY: POST OP INSTRUCTIONS ° °Always review your discharge instruction sheet given to you by the facility where your surgery was performed. ° °IF YOU HAVE DISABILITY OR FAMILY LEAVE FORMS, YOU MUST BRING THEM TO THE OFFICE FOR PROCESSING.  DO NOT GIVE THEM TO YOUR DOCTOR. ° °1. A prescription for pain medication may be given to you upon discharge.  Take your  pain medication as prescribed, if needed.  If narcotic pain medicine is not needed, then you may take acetaminophen (Tylenol) or ibuprofen (Advil) as needed. °2. Take your usually prescribed medications unless otherwise directed °3. If you need a refill on your pain medication, please contact your pharmacy.  They will contact our office to request authorization.  Prescriptions will not be filled after 5pm or on week-ends. °4. You should eat very light the first 24 hours after surgery, such as soup, crackers, pudding, etc.  Resume your normal diet the day after surgery. °5. Most patients will experience some swelling and bruising in the breast.  Ice packs and a good support bra will help.  Swelling and bruising can take several days to resolve.  °6. It is common to experience some constipation if taking pain medication after surgery.  Increasing fluid intake and taking a stool softener will usually help or prevent this problem from occurring.  A mild laxative (Milk of Magnesia or Miralax) should be taken according to package directions if there are no bowel movements after 48 hours. °7. Unless discharge instructions indicate otherwise, you may remove your bandages 24-48 hours after surgery, and you may shower at that time.  You may have steri-strips (small skin tapes) in place directly over the incision.  These strips should be left on the skin for 7-10 days.  If your surgeon used skin glue on the incision, you may shower in 24 hours.  The glue will flake off over the next 2-3 weeks.  Any sutures or staples will be removed at the office during your follow-up visit. °  8. ACTIVITIES:  You may resume regular daily activities (gradually increasing) beginning the next day.  Wearing a good support bra or sports bra minimizes pain and swelling.  You may have sexual intercourse when it is comfortable. °a. You may drive when you no longer are taking prescription pain medication, you can comfortably wear a seatbelt, and you can  safely maneuver your car and apply brakes. °b. RETURN TO WORK:  ______________________________________________________________________________________ °9. You should see your doctor in the office for a follow-up appointment approximately two weeks after your surgery.  Your doctor’s nurse will typically make your follow-up appointment when she calls you with your pathology report.  Expect your pathology report 2-3 business days after your surgery.  You may call to check if you do not hear from us after three days. °10. OTHER INSTRUCTIONS: _______________________________________________________________________________________________ _____________________________________________________________________________________________________________________________________ °_____________________________________________________________________________________________________________________________________ °_____________________________________________________________________________________________________________________________________ ° °WHEN TO CALL YOUR DOCTOR: °1. Fever over 101.0 °2. Nausea and/or vomiting. °3. Extreme swelling or bruising. °4. Continued bleeding from incision. °5. Increased pain, redness, or drainage from the incision. ° °The clinic staff is available to answer your questions during regular business hours.  Please don’t hesitate to call and ask to speak to one of the nurses for clinical concerns.  If you have a medical emergency, go to the nearest emergency room or call 911.  A surgeon from [facility] is always on call at the hospital. ° °For further questions, please visit [email or web address]  °

## 2017-08-17 NOTE — Anesthesia Procedure Notes (Signed)
Procedure Name: LMA Insertion Date/Time: 08/17/2017 8:59 AM Performed by: Talbot Grumbling Pre-anesthesia Checklist: Patient identified, Emergency Drugs available, Suction available and Patient being monitored Patient Re-evaluated:Patient Re-evaluated prior to induction Oxygen Delivery Method: Circle system utilized Preoxygenation: Pre-oxygenation with 100% oxygen Induction Type: IV induction Ventilation: Mask ventilation without difficulty LMA: LMA inserted LMA Size: 4.0 Number of attempts: 1 Placement Confirmation: positive ETCO2 and breath sounds checked- equal and bilateral Tube secured with: Tape Dental Injury: Teeth and Oropharynx as per pre-operative assessment

## 2017-08-17 NOTE — Interval H&P Note (Signed)
History and Physical Interval Note:  08/17/2017 8:16 AM  Caroline [name]  has presented today for surgery, with the diagnosis of RIGHT BREAST CANCER  The various methods of treatment have been discussed with the patient and family. After consideration of risks, benefits and other options for treatment, the patient has consented to  Procedure(s): BREAST LUMPECTOMY WITH RADIOACTIVE SEED AND SENTINEL LYMPH NODE BIOPSY (Right) as a surgical intervention .  The patient's history has been reviewed, patient examined, no change in status, stable for surgery.  I have reviewed the patient's chart and labs.  Questions were answered to the patient's satisfaction.     Ilka Lovick A.

## 2017-08-17 NOTE — Progress Notes (Signed)
Assisted Dr. Gifford Shave with right, ultrasound guided, pectoralis block. Side rails up, monitors on throughout procedure. See vital signs in flow sheet. Tolerated Procedure well.

## 2017-08-17 NOTE — Anesthesia Postprocedure Evaluation (Signed)
Anesthesia Post Note  Patient: Caroline [name]  Procedure(s) Performed: Procedure(s) (LRB): RIGHT BREAST RADIOACTIVE SEED LOCALIZED LUMPECTOMY AND SENTINEL LYMPH NODE BIOPSY (Right)     Patient location during evaluation: PACU Anesthesia Type: General Level of consciousness: awake and alert Pain management: pain level controlled Vital Signs Assessment: post-procedure vital signs reviewed and stable Respiratory status: spontaneous breathing, nonlabored ventilation and respiratory function stable Cardiovascular status: blood pressure returned to baseline and stable Postop Assessment: no signs of nausea or vomiting Anesthetic complications: no    Last Vitals:  Vitals:   08/17/17 1045 08/17/17 1105  BP: 93/64 101/70  Pulse: 88 87  Resp: 14 18  Temp:  36.9 C  SpO2: 93% 96%    Last Pain:  Vitals:   08/17/17 1105  TempSrc: Oral  PainSc: 3                  Catalina Gravel

## 2017-08-17 NOTE — Op Note (Signed)
Preoperative diagnosis: Stage I right  breast cancer lower -outer quadrant   Postoperative diagnosis: Same   Procedure: right breast seed localized lumpectomy with deep  left axillary sentinel lymph node mapping    Surgeon: Erroll Luna M.D.   Anesthesia: LMA with pectoral block anesthesia  And local   EBL: 20 cc   Specimen:  Right  breast mass with clip and seed to pathology and one axillary sentinel node hot   Drains: None   Indications for procedure: Patient presents for treatment of her right breast cancer. She has opted for breast conservation after lengthy discussion of treatment options to include breast conservation surgery and mastectomy and reconstruction. Risks, benefits and alternatives discussed with the patient.The procedure has been discussed with the patient. Alternatives to surgery have been discussed with the patient. Risks of surgery include bleeding, Infection, Seroma formation, cosmetic deformity ,  death, and the need for further surgery. The patient understands and wishes to proceed.Sentinel lymph node mapping and dissection has been discussed with the patient. Risk of bleeding, Infection, Seroma formation, Additional procedures, Shoulder weakness , Shoulder stiffness, Nerve and blood vessel injury and reaction to the mapping dyes have been discussed. Alternatives to surgery have been discussed with the patient. The patient agrees to proceed.    Description of procedure: Patient underwent placement of two  right  breast seeds by  radiology earlier in the week. She presents to the holding area and questions are answered.  Right breast marked as correct. Patient underwent technetium sulfur colloid injection per protocol. Questions answered. Patient taken back to operating room and placed supine on the operating room table. Patient received 2 g of Ancef. After induction of LMA anesthesia right  breast was prepped and draped in a sterile fashion. Of note, patient had pectoral  block by anesthesia prior to this. Neoprobe was used to identify the radioactive seen in the right  Lower -outer quadrant. Curvilinear incision made and dissection was carried around to excise all tissue around both the clips and seeds. Radiograph showed the mass with gross negative margins. Both seeds  and clips were in the specimen. Specimen sent to pathology.  Neoprobe was switched to the technetium sulfur colloid setting. Hot spot identified in  the right  axilla. Incision made in the inferior axillary hairline and dissection carried into the axilla. One Hot  lymph node was  identified and excised. Background counts approached 0. Wound  was irrigated and closed with 3-0 Vicryl and 4-0 Monocryl. Lumpectomy site closed in a similar fashion. Dermabond applied. All final counts of  sponge, needle instruments found to be correct at this point. Patient awoke, taken to recovery in satisfactory condition.

## 2017-08-17 NOTE — H&P (View-Only) (Signed)
Caroline [name] 08/09/2017 1:37 PM Location: [facility] Patient #: [id number] DOB: July 19, 1956 Married / Language: Caroline [name] / Race: White Female  History of Present Illness Caroline Moores A. Cornett MD; 08/09/2017 3:36 PM) Patient words: Patient sent at the request of Dr. Ammie [name] for mammographic abnormality picked up on screening mammogram. The patient was found to have a right upper-outer quadrant breast mass. There were 2 masses in the right outer breast. Total area measured 2 cm. These were felt to be too discrete masses. 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 She was ER positive PR positive HER-2/neu negative. She had no symptoms prior to her mammogram. Patient denied any history of breast pain nipple discharge or breast mass.                      CLINICAL DATA: 61 year old female for further evaluation of possible right breast distortion on screening mammogram. EXAM: 2D DIGITAL DIAGNOSTIC RIGHT MAMMOGRAM WITH ADJUNCT TOMO ULTRASOUND RIGHT BREAST COMPARISON: Previous exam(s). ACR Breast Density Category c: The breast tissue is heterogeneously dense, which may obscure small masses. FINDINGS: 2D and 3D spot compression views the right breast demonstrate an area of distortion within the posterior outer right breast. On physical exam, focal thickening at the 8:30 position of the right breast 4 cm from the nipple noted. Targeted ultrasound is performed, showing 3 separate irregular hypoechoic areas in the outer right breast 4-5 cm from the nipple as follows: An 8 x 9 x 7 mm irregular hypoechoic area with distortion at the 8:30 position 4 cm from the nipple. A 6 x 6 x 7 mm irregular hypoechoic area at the 8:30 position 5 cm from the nipple, and lies 7 mm from the larger lesion above. A 5 x 3 x 5 mm irregular hypoechoic area at the 9 o'clock position 5 cm from the nipple, and lies 5 mm from the other areas. The 3  irregular hypoechoic areas encompass a region measuring 2.1 cm in greatest diameter. No abnormal right axillary lymph nodes are identified. IMPRESSION: Three suspicious irregular hypoechoic areas within the outer right breast, corresponding to the area of mammographic distortion, with the entire area measuring 2.1 cm in greatest diameter. Tissue sampling is recommended. No abnormal right axillary lymph nodes. RECOMMENDATION: Ultrasound-guided right breast biopsies of the largest lesion and 1 of the other smaller lesions (preferably furthest away). These biopsies have been scheduled for 08/02/2017 and the patient informed. I have discussed the findings and recommendations with the patient. Results were also provided in writing at the conclusion of the visit. If applicable, a reminder letter will be sent to the patient regarding the next appointment. BI-RADS CATEGORY 4: Suspicious. Electronically Signed By: Caroline [name] M.D. On: 07/28/2017 11:51     US BREAST LTD UNI RIGHT INC AXILLA (Order [id number]) - Reflex for Order [id number]               ADDITIONAL INFORMATION: 1. PROGNOSTIC INDICATORS Results: IMMUNOHISTOCHEMICAL AND MORPHOMETRIC ANALYSIS PERFORMED MANUALLY Estrogen Receptor: 90%, POSITIVE, STRONG STAINING INTENSITY Progesterone Receptor: 20%, POSITIVE, STRONG STAINING INTENSITY Proliferation Marker Ki67: 15% REFERENCE RANGE ESTROGEN RECEPTOR NEGATIVE 0% POSITIVE =>1% REFERENCE RANGE PROGESTERONE RECEPTOR NEGATIVE 0% POSITIVE =>1% All controls stained appropriately Caroline Laws MD Pathologist, Electronic Signature ( Signed 08/08/2017) 1. FLUORESCENCE IN-SITU HYBRIDIZATION Results: HER2 - NEGATIVE RATIO OF HER2/CEP17 SIGNALS 1.00 AVERAGE HER2 COPY NUMBER PER CELL 1.05 Reference Range: NEGATIVE HER2/CEP17 Ratio <2.0 and average HER2 copy number <4.0 EQUIVOCAL HER2/CEP17 Ratio <  2.0 and average HER2 copy number >=4.0 and <6.0 1 of 3 FINAL for  Caroline [name] [id number]) ADDITIONAL INFORMATION:(continued) POSITIVE HER2/CEP17 Ratio >=2.0 or <2.0 and average HER2 copy number >=6.0 Caroline Males MD Pathologist, Electronic Signature ( Signed 08/04/2017) FINAL DIAGNOSIS Diagnosis 1. 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 - LYMPHOVASCULAR INVASION PRESENT. 2. Breast, right, needle core biopsy, 8:30 o'clock; 5CMFN - INVASIVE DUCTAL CARCINOMA. Microscopic Comment 1. The carcinoma appears grade 2. Parts #1 and #2 have a similar morphology. Prognostic markers will be ordered. Dr. Lyndon [name] has reviewed the case. The case was called to [name] on 08/03/2017. Caroline Males MD Pathologist, Electronic Signature (Case signed 08/03/2017) Specimen Gross and Clinical Information Specimen Comment 1. Time in formalin: 15:20 PM Specimen(s) Obtained: 1. Breast, right, needle core biopsy, 8:30 o'clock, 4CMFN 2. Breast, right, needle core biopsy, 8:30 o'clock; 5CMFN Specimen Clinical Information 1. Mass suspicious for malignancy 2. Mass - possible FCC, R/O malignancy Gross 1. Received labeled as "Caroline [name]" and "Rt breast 830 4cm" ( TIF 1520 CIT ?) are 4 cores of yellow to gray white soft to firm tissue, ranging from 0.4 x 0.1 x 0.1 cm to 1.2 x 0.1 x 0.1 cm. One block submitted. (SSW 8/8) 2. Received labeled as "Caroline [name]" and "Rt breast 830 5cm" ( TIF 1525 CIT ?) are 4 cores of yellow to gray white soft to firm tissue, which average 0.9 x 0.1 x 0.1 cm. One block submitted. (SSW 8/8) Stain(s) used in Diagnosis: The following stain(s) were used in diagnosing the case: Her2 FISH, ER-ACIS, PR-ACIS, KI-67-ACIS. The control(s) stained appropriately. 2 of 3 FINAL for Caroline [name] ([id number]) Disclaimer Estrogen receptor [phone]), immunohistochemical stains are performed on formalin fixed, paraffin embedded tissue using a  3,3"-diaminobenzidine (DAB) chromogen and [place]. The staining intensity of the nucleus is scored manually and is reported as the percentage of tumor cell nuclei demonstrating specific nuclear staining.Specimens are fixed in 10% Neutral Buffered Formalin for at least 6 hours and up to 72 hours. These tests have not be validated on decalcified tissue. Results should be interpreted with caution given the possibility of false negative results on decalcified specimens. HER2 IQFISH pharmDX ([name] [phone]) is a direct fluorescence in-situ hybridization assay designed to quantitatively determine HER2 gene amplification in formalin-fixed, paraffin-embedded tissue specimens. It is performed at [facility] and is reported using ASCO/CAP scoring criteria published in 2013. Ki-67 (MM1), immunohistochemical stains are performed on formalin fixed, paraffin embedded tissue using a 3,3"-diaminobenzidine (DAB) chromogen and Leica [employer] Autostainer System. The staining intensity of the nucleus is scored manually and is reported as the percentage of tumor cell nuclei demonstrating specific nuclear staining.Specimens are fixed in 10% Neutral Buffered Formalin for at least 6 hours and up to 72 hours. These tests have not be validated on decalcified tissue. Results should be interpreted with caution given the possibility of false negative results on decalcified specimens. PR progesterone receptor (16), immunohistochemical stains are performed on formalin fixed, paraffin embedded tissue using a 3,3"-diaminobenzidine (DAB) chromogen and Leica Bond Autostainer System. The staining intensity of the nucleus is scored manually and is reported as the percentage of tumor cell nuclei demonstrating specific nuclear staining.Specimens are fixed in 10% Neutral Buffered Formalin for at least 6 hours and up to 72 hours. These tests have not be validated on decalcified tissue. Results should be interpreted with  caution given the possibility of false negative  results on decalcified specimens. Report signed out from the following location(s) Technical Component was performed at [facility]. [address],[place] [address].[id number]., Interpretation was performed at [address], [place], [place] [address]. CLIA #: [id number], 3 of.  The patient is a 61 year old female.   Past Surgical History (Tanisha A. Owens Shark, [place]; 08/09/2017 1:38 PM) Cesarean Section - Multiple Hemorrhoidectomy  Diagnostic Studies History (Tanisha A. Owens Shark, [place]; 08/09/2017 1:38 PM) Colonoscopy 1-5 years ago Mammogram within last year Pap Smear 1-5 years ago  Allergies (Tanisha A. Owens Shark, [place]; 08/09/2017 1:40 PM) Sulfa Antibiotics Allergies Reconciled  Medication History (Tanisha A. Owens Shark, [place]; 08/09/2017 1:40 PM) BuPROPion HCl ER (XL) (150MG Tablet ER 24HR, Oral) Active. Eliquis (5MG Tablet, Oral) Active. Fenofibrate (160MG Tablet, Oral) Active. Hydrocodone-Acetaminophen (5-325MG Tablet, Oral) Active. Valsartan-Hydrochlorothiazide (160-12.5MG Tablet, Oral) Active. Medications Reconciled  Social History (Tanisha A. Owens Shark, [place]; 08/09/2017 1:38 PM) Alcohol use Occasional alcohol use. No drug use  Family History (Tanisha A. Owens Shark, [place]; 08/09/2017 1:38 PM) Arthritis Brother, Mother.  Pregnancy / Birth History (Tanisha A. Owens Shark, [place]; 08/09/2017 1:38 PM) Age at menarche 62 years. Age of menopause 67-50 Contraceptive History Oral contraceptives. Gravida 3 Maternal age 8-35 Para 2  Other Problems (Tanisha A. Owens Shark, [place]; 08/09/2017 1:38 PM) Arthritis Breast Cancer High blood pressure     Review of Systems (Tanisha A. Brown RMA; 08/09/2017 1:38 PM) General Not Present- Appetite Loss, Chills, Fatigue, Fever, Night Sweats, Weight Gain and Weight Loss. Skin Not Present- Change in Wart/Mole, Dryness, Hives, Jaundice, New  Lesions, Non-Healing Wounds, Rash and Ulcer. HEENT Not Present- Earache, Hearing Loss, Hoarseness, Nose Bleed, Oral Ulcers, Ringing in the Ears, Seasonal Allergies, Sinus Pain, Sore Throat, Visual Disturbances, Wears glasses/contact lenses and Yellow Eyes. Respiratory Not Present- Bloody sputum, Chronic Cough, Difficulty Breathing, Snoring and Wheezing. Breast Present- Breast Mass. Not Present- Breast Pain, Nipple Discharge and Skin Changes. Cardiovascular Not Present- Chest Pain, Difficulty Breathing Lying Down, Leg Cramps, Palpitations, Rapid Heart Rate, Shortness of Breath and Swelling of Extremities. Female Genitourinary Not Present- Frequency, Nocturia, Painful Urination, Pelvic Pain and Urgency. Musculoskeletal Present- Joint Pain. Not Present- Back Pain, Joint Stiffness, Muscle Pain, Muscle Weakness and Swelling of Extremities. Neurological Not Present- Decreased Memory, Fainting, Headaches, Numbness, Seizures, Tingling, Tremor, Trouble walking and Weakness. Hematology Not Present- Blood Thinners, Easy Bruising, Excessive bleeding, Gland problems, HIV and Persistent Infections.  Vitals (Tanisha A. Brown RMA; 08/09/2017 1:39 PM) 08/09/2017 1:38 PM Weight: 168 lb Height: 64in Body Surface Area: 1.82 m Body Mass Index: 28.84 kg/m  Temp.: 97.34F  Pulse: 104 (Regular)  P.OX: 96% (Room air) BP: 118/78 (Sitting, Left Arm, Standard)      Physical Exam (Thomas A. Cornett MD; 08/09/2017 3:36 PM)  General Mental Status-Alert. General Appearance-Consistent with stated age. Hydration-Well hydrated. Voice-Normal.  Eye Eyeball - Bilateral-Extraocular movements intact. Sclera/Conjunctiva - Bilateral-No scleral icterus.  Chest and Lung Exam Chest and lung exam reveals -quiet, even and easy respiratory effort with no use of accessory muscles and on auscultation, normal breath sounds, no adventitious sounds and normal vocal resonance. Inspection Chest Wall - Normal.  Back - normal.  Breast Note: Mild bruising right breast outer quadrant and central quadrant. Small hematoma. Left breast normal.  Musculoskeletal Normal Exam - Left-Upper Extremity Strength Normal and Lower Extremity Strength Normal. Normal Exam - Right-Upper Extremity Strength Normal and Lower Extremity Strength Normal.  Lymphatic Head & Neck  General Head & Neck Lymphatics: Bilateral - Description - Normal. Axillary  General  Axillary Region: Bilateral - Description - Normal. Tenderness - Non Tender.    Assessment & Plan (Christeen Lai A. Keierra Nudo MD; 08/09/2017 3:36 PM)  BREAST CANCER, RIGHT (C50.911) Impression: Patient has opted for right breast lumpectomy with sentinel lymph node mapping. I discussed procedure as well as alternatives to breast conservation and potential other treatments required with breast conservation to include radiation chemotherapy. I will refer her to medical and radiation oncology. Risk of lumpectomy include bleeding, infection, seroma, more surgery, use of seed/wire, wound care, cosmetic deformity and the need for other treatments, death , blood clots, death. Pt agrees to proceed. Risk of sentinel lymph node mapping include bleeding, infection, lymphedema, shoulder pain. stiffness, dye allergy. cosmetic deformity , blood clots, death, need for more surgery. Pt agres to proceed.  Current Plans You are being scheduled for surgery- Our schedulers will call you.  You should hear from our office's scheduling department within 5 working days about the location, date, and time of surgery. We try to make accommodations for patient's preferences in scheduling surgery, but sometimes the OR schedule or the surgeon's schedule prevents [place] from making those accommodations.  If you have not heard from our office [phone]) in 5 working days, call the office and ask for your surgeon's nurse.  If you have other questions about your diagnosis, plan, or surgery, call the  office and ask for your surgeon's nurse.  Pt Education - [facility] Breast Cancer Information Given - [facility] "Breast Journey" Package Pt Education - Pamphlet Given - Breast Biopsy: discussed with patient and provided information. We discussed the staging and pathophysiology of breast cancer. We discussed all of the different options for treatment for breast cancer including surgery, chemotherapy, radiation therapy, Herceptin, and antiestrogen therapy. We discussed a sentinel lymph node biopsy as she does not appear to having lymph node involvement right now. We discussed the performance of that with injection of radioactive tracer and blue dye. We discussed that she would have an incision underneath her axillary hairline. We discussed that there is a bout a 10-20% chance of having a positive node with a sentinel lymph node biopsy and we will await the permanent pathology to make any other first further decisions in terms of her treatment. One of these options might be to return to the operating room to perform an axillary lymph node dissection. We discussed about a 1-2% risk lifetime of chronic shoulder pain as well as lymphedema associated with a sentinel lymph node biopsy. We discussed the options for treatment of the breast cancer which included lumpectomy versus a mastectomy. We discussed the performance of the lumpectomy with a wire placement. We discussed a 10-20% chance of a positive margin requiring reexcision in the operating room. We also discussed that she may need radiation therapy or antiestrogen therapy or both if she undergoes lumpectomy. We discussed the mastectomy and the postoperative care for that as well. We discussed that there is no difference in her survival whether she undergoes lumpectomy with radiation therapy or antiestrogen therapy versus a mastectomy. There is a slight difference in the local recurrence rate being 3-5% with lumpectomy and about 1% with a mastectomy. We discussed the  risks of operation including bleeding, infection, possible reoperation. She understands her further therapy will be based on what her stages at the time of her operation.  Pt Education - [facility] breast cancer surgery: discussed with patient and provided information. Pt Education - ABC (After Breast Cancer) Class Info: discussed with patient and provided information.

## 2017-08-17 NOTE — Addendum Note (Signed)
Addendum  created 08/17/17 1439 by Talbot Grumbling, CRNA   Charge Capture section accepted

## 2017-08-17 NOTE — Transfer of Care (Signed)
Immediate Anesthesia Transfer of Care Note  Patient: Caroline [name]  Procedure(s) Performed: Procedure(s): RIGHT BREAST RADIOACTIVE SEED LOCALIZED LUMPECTOMY AND SENTINEL LYMPH NODE BIOPSY (Right)  Patient Location: PACU  Anesthesia Type:General  Level of Consciousness:  sedated, patient cooperative and responds to stimulation  Airway & Oxygen Therapy:Patient Spontanous Breathing and Patient connected to face mask oxgen  Post-op Assessment:  Report given to PACU RN and Post -op Vital signs reviewed and stable  Post vital signs:  Reviewed and stable  Last Vitals:  Vitals:   08/17/17 0830 08/17/17 0840  BP: 94/60   Pulse: 77 76  Resp: (!) 25 19  Temp:    SpO2: 005% 11%    Complications: No apparent anesthesia complications

## 2017-08-17 NOTE — Anesthesia Preprocedure Evaluation (Signed)
Anesthesia Evaluation  Patient identified by MRN, date of birth, ID band Patient awake    Reviewed: Allergy & Precautions, NPO status , Patient's Chart, lab work & pertinent test results, reviewed documented beta blocker date and time   Airway Mallampati: III  TM Distance: <3 FB Neck ROM: Full    Dental  (+) Teeth Intact, Dental Advisory Given   Pulmonary Current Smoker,    Pulmonary exam normal breath sounds clear to auscultation       Cardiovascular hypertension, Pt. on medications and Pt. on home beta blockers Normal cardiovascular exam+ dysrhythmias Atrial Fibrillation  Rhythm:Regular Rate:Normal     Neuro/Psych negative neurological ROS  negative psych ROS   GI/Hepatic negative GI ROS, Neg liver ROS,   Endo/Other  negative endocrine ROS  Renal/GU negative Renal ROS     Musculoskeletal  (+) Arthritis , Osteoarthritis,    Abdominal   Peds  Hematology  (+) Blood dyscrasia (Eliquis), ,   Anesthesia Other Findings Day of surgery medications reviewed with the patient.  Reproductive/Obstetrics                             Anesthesia Physical Anesthesia Plan  ASA: II  Anesthesia Plan: General   Post-op Pain Management:  Regional for Post-op pain   Induction: Intravenous  PONV Risk Score and Plan: 2 and Ondansetron and Dexamethasone  Airway Management Planned: LMA  Additional Equipment:   Intra-op Plan:   Post-operative Plan: Extubation in OR  Informed Consent: I have reviewed the patients History and Physical, chart, labs and discussed the procedure including the risks, benefits and alternatives for the proposed anesthesia with the patient or authorized representative who has indicated his/her understanding and acceptance.   Dental advisory given  Plan Discussed with: CRNA  Anesthesia Plan Comments: (Risks/benefits of general anesthesia discussed with patient including risk of  damage to teeth, lips, gum, and tongue, nausea/vomiting, allergic reactions to medications, and the possibility of heart attack, stroke and death.  All patient questions answered.  Patient wishes to proceed.)        Anesthesia Quick Evaluation

## 2017-08-18 ENCOUNTER — Encounter (HOSPITAL_BASED_OUTPATIENT_CLINIC_OR_DEPARTMENT_OTHER): Payer: Self-pay | Admitting: Surgery

## 2017-08-22 ENCOUNTER — Ambulatory Visit: Payer: Self-pay | Admitting: Surgery

## 2017-08-24 NOTE — Progress Notes (Signed)
Location of Breast Cancer: Right Breast  Histology per Pathology Report:  08/02/17 Diagnosis 1. 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 - LYMPHOVASCULAR INVASION PRESENT. 2. Breast, right, needle core biopsy, 8:30 o'clock; 5CMFN - INVASIVE DUCTAL CARCINOMA.  Receptor Status: ER(90%), PR (20%), Her2-neu (NEG), Ki-(15%)  08/17/17 Diagnosis 1. Breast, lumpectomy, Right - INVASIVE AND IN SITU DUCTAL CARCINOMA, MULTIFOCAL, 1.5 AND 0.7 CM. - INVASIVE CARCINOMA BROADLY AT MEDIAL MARGIN, FOCALLY AT LATERAL MARGIN AND FOCALLY LESS THAN 0.1 CM FROM POSTERIOR MARGIN. - LYMPHOVASCULAR SPACE INVOLVEMENT BY TUMOR. - PREVIOUSLY BIOPSY SITES AND CLIPS. 2. Lymph node, sentinel, biopsy - METASTATIC CARCINOMA IN ONE LYMPH NODE (1/1).  Did patient present with symptoms or was this found on screening mammography?: It was found on a screening mammogram.   Past/Anticipated interventions by surgeon, if any: 08/17/17 Procedure: right breast seed localized lumpectomy with deep  left axillary sentinel lymph node mapping   Surgeon: Erroll Luna M.D.   She will see Dr. Brantley Stage next week for a second surgery to clear margins found during lumpectomy.   Past/Anticipated interventions by medical oncology, if any:  08/31/17 Dr. Jana Hakim.   Lymphedema issues, if any:  She denies. She has good arm mobility.   Pain issues, if any:  She denies breast pain. She does have pain to her bilateral hips due to a recent diagnosis of arthritis.   SAFETY ISSUES:  Prior radiation? No  Pacemaker/ICD? No  Possible current pregnancy? No  Is the patient on methotrexate? No  Current Complaints / other details:    BP 100/66   Pulse 72   Temp 98.3 F (36.8 C)   Ht _0  (1.626 m)   Wt 166 lb 9.6 oz (75.6 kg)   LMP 11/25/2010   SpO2 99% Comment: room air  BMI 28.60 kg/m    Wt Readings from Last 3 Encounters:  08/30/17 166 lb  9.6 oz (75.6 kg)  08/17/17 166 lb (75.3 kg)  02/10/17 167 lb 9.6 oz (76 kg)      Caroline [name], Caroline Police, RN 08/24/2017,9:18 AM

## 2017-08-26 HISTORY — PX: BREAST LUMPECTOMY: SHX2

## 2017-08-30 ENCOUNTER — Ambulatory Visit
Admission: RE | Admit: 2017-08-30 | Discharge: 2017-08-30 | Disposition: A | Discharge status: Home / Self Care | Payer: 59 | Source: Ambulatory Visit | Attending: Radiation Oncology | Admitting: Radiation Oncology

## 2017-08-30 ENCOUNTER — Encounter (HOSPITAL_BASED_OUTPATIENT_CLINIC_OR_DEPARTMENT_OTHER): Payer: Self-pay | Admitting: *Deleted

## 2017-08-30 ENCOUNTER — Other Ambulatory Visit: Payer: Self-pay

## 2017-08-30 ENCOUNTER — Encounter: Payer: Self-pay | Admitting: Radiation Oncology

## 2017-08-30 DIAGNOSIS — Z882 Allergy status to sulfonamides status: Secondary | ICD-10-CM | POA: Diagnosis not present

## 2017-08-30 DIAGNOSIS — F419 Anxiety disorder, unspecified: Secondary | ICD-10-CM | POA: Insufficient documentation

## 2017-08-30 DIAGNOSIS — M25559 Pain in unspecified hip: Secondary | ICD-10-CM | POA: Insufficient documentation

## 2017-08-30 DIAGNOSIS — I10 Essential (primary) hypertension: Secondary | ICD-10-CM | POA: Insufficient documentation

## 2017-08-30 DIAGNOSIS — Z9889 Other specified postprocedural states: Secondary | ICD-10-CM | POA: Insufficient documentation

## 2017-08-30 DIAGNOSIS — C50919 Malignant neoplasm of unspecified site of unspecified female breast: Secondary | ICD-10-CM | POA: Insufficient documentation

## 2017-08-30 DIAGNOSIS — Z7901 Long term (current) use of anticoagulants: Secondary | ICD-10-CM | POA: Diagnosis not present

## 2017-08-30 DIAGNOSIS — F17210 Nicotine dependence, cigarettes, uncomplicated: Secondary | ICD-10-CM | POA: Insufficient documentation

## 2017-08-30 DIAGNOSIS — Z791 Long term (current) use of non-steroidal anti-inflammatories (NSAID): Secondary | ICD-10-CM | POA: Insufficient documentation

## 2017-08-30 DIAGNOSIS — Z510 Encounter for antineoplastic radiation therapy: Secondary | ICD-10-CM | POA: Diagnosis not present

## 2017-08-30 DIAGNOSIS — Z79899 Other long term (current) drug therapy: Secondary | ICD-10-CM | POA: Insufficient documentation

## 2017-08-30 DIAGNOSIS — I480 Paroxysmal atrial fibrillation: Secondary | ICD-10-CM | POA: Insufficient documentation

## 2017-08-30 DIAGNOSIS — C50511 Malignant neoplasm of lower-outer quadrant of right female breast: Secondary | ICD-10-CM

## 2017-08-30 DIAGNOSIS — Z170 Estrogen receptor positive status [ER+]: Secondary | ICD-10-CM | POA: Diagnosis not present

## 2017-08-30 DIAGNOSIS — R202 Paresthesia of skin: Secondary | ICD-10-CM | POA: Diagnosis not present

## 2017-08-30 DIAGNOSIS — Z803 Family history of malignant neoplasm of breast: Secondary | ICD-10-CM | POA: Insufficient documentation

## 2017-08-30 NOTE — Progress Notes (Signed)
Radiation Oncology         [phone] ________________________________  Initial outpatient Consultation  Name: Caroline [name] MRN: [id number]  Date: 08/30/2017  DOB: 20-Aug-1956  HQ:[id number], Domingo Pulse, PA-C  Erroll Luna, MD   REFERRING PHYSICIAN: Erroll Luna, MD  DIAGNOSIS:    ICD-10-CM   1. 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 female who presented with two right breast masses found on routine screening mammography.  Biopsy showed invasive ductal carcinoma with characteristics as described above in the diagnosis.  The pt presented with an abnormality on right breast mammography. She did not self-palpate any lumps. Screening [facility] mammogram on 07/21/17 revealed possible distortion in the right breast. [place] on 07/28/17 revealed three suspicious areas in the outer right breast, entire area measured 2.1cm. No abnormal right lymph nodes. Biopsy on 08/02/17 of the right breast at 2 sites at 8:30 revealed invasive ductal carcinoma, ER strongly 90% positive, PR strongly 20%+, HeR2(-). Dr. Brantley Stage performed right lumpectomy and sentinel lymph node biopsy on 08/17/17, this revealed multifocal invasive and in situ ductal carcinoma, 1.5 and 0.7cm. 1/1 sentinel nodes were positive. She has a positive margins medially and laterally to invasive disease which will be cleared next week. DCIS margins are clear by 54m. She sees Dr. MJana Hakimin medical oncology on 08/31/17. She will also have re-excision of the right breast lumpectomy next week on 09/06/17.   She presents today with her husband. [place]  free time. Overall, she has been doing well and is otherwise asymptomatic. She is a current, everyday smoker at 0.5ppd. She is currently motivated to cease smoking, and she states that she has previously attempted using Nicotine patches with some success. She is currently prescribed Wellbutrin by Dr SLottie Dawsonto aid with this as well.   She reports baseline finger tingling, anxiety, and hip pain r/t arthritis. See orthopedics.  PREVIOUS RADIATION THERAPY: No  PAST MEDICAL HISTORY:  has a past medical history of Arthritis; Hypertension; and [place]oxysmal [place].    PAST SURGICAL HISTORY: Past Surgical History:  Procedure Laterality Date  . BREAST LUMPECTOMY WITH RADIOACTIVE SEED AND SENTINEL LYMPH NODE BIOPSY Right 08/17/2017   Procedure: RIGHT BREAST RADIOACTIVE SEED LOCALIZED LUMPECTOMY AND SENTINEL LYMPH NODE BIOPSY;  Surgeon: CErroll Luna MD;  [facility]  Service: General;  Laterality: Right;  . CESAREAN SECTION  [phone] . HEMORRHOID SURGERY     20 years ago  . TOE SURGERY Right    5th 20 years ago   FAMILY HISTORY: family history includes Breast cancer in her cousin.  SOCIAL HISTORY:  reports that she has been smoking Cigarettes.  She has a 20.00 pack-year smoking history. She has never used smokeless tobacco. She reports that she drinks about 4.2 oz of alcohol per week . She reports that she does not use drugs.  ALLERGIES: Sulfa antibiotics  MEDICATIONS:  Current Outpatient Prescriptions  Medication Sig Dispense Refill  . apixaban (ELIQUIS) 5 MG TABS tablet Take 1 tablet (5 mg total) by mouth 2 (two) times daily. 60 tablet 6  .  buPROPion (WELLBUTRIN XL) 150 MG 24 hr tablet Take 150 mg by mouth daily.    . fenofibrate 160 MG tablet Take 160 mg by mouth daily.    Marland Kitchen ibuprofen (ADVIL,MOTRIN) 800 MG tablet Take 1 tablet (800 mg total) by mouth every 8 (eight) hours as needed. 30 tablet 0  . metoprolol succinate (TOPROL-XL) 25 MG  24 hr tablet TAKE 1 TABLET DAILY 90 tablet 2  . valsartan-hydrochlorothiazide (DIOVAN-HCT) 160-12.5 MG tablet Take 1 tablet by mouth daily.    Marland Kitchen HYDROcodone-acetaminophen (NORCO/VICODIN) 5-325 MG tablet Take 1 tablet by mouth every 6 (six) hours as needed for moderate pain.    Marland Kitchen oxyCODONE (OXY IR/ROXICODONE) 5 MG immediate release tablet Take 1-2 tablets (5-10 mg total) by mouth every 6 (six) hours as needed for severe pain. (Patient not taking: Reported on 08/30/2017) 15 tablet 0   No current facility-administered medications for this encounter.     REVIEW OF SYSTEMS: A 10+ POINT REVIEW OF SYSTEMS WAS OBTAINED including neurology, dermatology, psychiatry, cardiac, respiratory, lymph, extremities, GI, GU, Musculoskeletal, constitutional, breasts, reproductive, HEENT.  All pertinent positives are noted in the HPI.  All others are negative.   PHYSICAL EXAM:  height is '5\' 4"'  (1.626 m) and weight is 166 lb 9.6 oz (75.6 kg). Her temperature is 98.3 F (36.8 C). Her blood pressure is 100/66 and her pulse is 72. Her oxygen saturation is 99%.   General: Alert and oriented, in no acute distress HEENT: Head is normocephalic. Extraocular movements are intact. Oropharynx is clear. Neck: Neck is supple, no palpable cervical or supraclavicular lymphadenopathy. Heart: Regular in rate and rhythm with no murmurs, rubs, or gallops. Chest: Clear to auscultation bilaterally, with no rhonchi, wheezes, or rales. Abdomen: Soft, nontender, nondistended, with no rigidity or guarding. Extremities: No cyanosis. Trace edema is noted to the bilateral ankles. Lymphatics: see Neck Exam Skin: No concerning lesions. Musculoskeletal: symmetric strength and muscle tone throughout. Neurologic: Cranial nerves II through XII are grossly intact. No obvious focalities. Speech is fluent. Coordination is intact. Psychiatric: Judgment and insight are intact. Affect is appropriate. Breasts: Right breast with post-operative swelling in  the LOQ. No concerning skin changes or excessive warmth. Scars are healing well. Left breast and axilla are unremarkable. No other palpable masses appreciated in the breasts or axillae.  ECOG = 0  0 - Asymptomatic (Fully active, able to carry on all predisease activities without restriction)  1 - Symptomatic but completely ambulatory (Restricted in physically strenuous activity but ambulatory and able to carry out work of a light or sedentary nature. For example, light housework, office work)  2 - Symptomatic, <50% in bed during the day (Ambulatory and capable of all self care but unable to carry out any work activities. Up and about more than 50% of waking hours)  3 - Symptomatic, >50% in bed, but not bedbound (Capable of only limited self-care, confined to bed or chair 50% or more of waking hours)  4 - Bedbound (Completely disabled. Cannot carry on any self-care. Totally confined to bed or chair)  5 - Death   Eustace Pen MM, Creech RH, Tormey DC, et al. [phone]). "Toxicity and response criteria of the [facility]". [place] Oncol. 5 (6): 649-55   LABORATORY DATA:  Lab Results  Component Value Date   WBC 7.2 10/03/2016   HGB 11.2 (L) 08/17/2017   HCT 38.9 10/03/2016   MCV 100.0 10/03/2016   PLT 207 10/03/2016   CMP     Component Value  Date/Time   NA 137 08/11/2017 1445   K 4.1 08/11/2017 1445   CL 96 (L) 08/11/2017 1445   CO2 32 08/11/2017 1445   GLUCOSE 121 (H) 08/11/2017 1445   BUN 33 (H) 08/11/2017 1445   CREATININE 1.78 (H) 08/11/2017 1445   CALCIUM 9.7 08/11/2017 1445   PROT 5.9 (L) 11/27/2011 0500   ALBUMIN 3.0 (L) 11/27/2011 0500   AST 17 11/27/2011 0500   ALT 16 11/27/2011 0500   ALKPHOS 67 11/27/2011 0500   BILITOT 0.5 11/27/2011 0500   GFRNONAA 30 (L) 08/11/2017 1445   GFRAA 35 (L) 08/11/2017 1445    RADIOGRAPHY: Mm Breast Surgical Specimen  Result Date: 08/17/2017 CLINICAL DATA:  Two radioactive seeds were placed in the right breast on  08/14/2017 prior to surgery. The patient has a recent diagnosis of right breast cancer. EXAM: SPECIMEN RADIOGRAPH OF THE RIGHT BREAST COMPARISON:  Previous exam(s). FINDINGS: Status post excision of the right breast. Two radioactive seeds, a ribbon shaped biopsy marker clip, and a coil shaped biopsy marker clip are present, completely intact, and were marked for pathology. IMPRESSION: Specimen radiograph of the right breast. Electronically Signed   By: Curlene Dolphin M.D.   On: 08/17/2017 09:54   [place] Rt Radioactive Seed Loc  Result Date: 08/14/2017 CLINICAL DATA:  The patient presents for placement of radioactive seeds prior to lumpectomy for multifocal invasive ductal carcinoma in the right breast. EXAM: ULTRASOUND GUIDED RADIOACTIVE SEED LOCALIZATION OF THE RIGHT BREAST x2 COMPARISON:  Previous exam(s). FINDINGS: Patient presents for radioactive seed localization prior to lumpectomy. Prior to the procedure, I discussed the options with Dr. Brantley Stage . We discussed identifying the lesion not yet biopsied in the 9 o'clock location of the right breast 5 cm from the nipple with a radioactive seed. A second radioactive seed will be placed in 1 of the previously biopsied lesions in the 830 o'clock location. I met with the patient and we discussed the procedure of seed localization including benefits and alternatives. We discussed the high likelihood of a successful procedure. We discussed the risks of the procedure including infection, bleeding, tissue injury and further surgery. We discussed the low dose of radioactivity involved in the procedure. Informed, written consent was given. The usual time-out protocol was performed immediately prior to the procedure. Using ultrasound guidance, sterile technique, 1% lidocaine and an I-125 radioactive seed, the lesion in the 9 o'clock location 5 cm from the nipple (not previously biopsied) was localized using a lateral approach. Order number of I-125 seed:  [id number]. Total  activity: 8.502 millicuries reference Date: 07/28/2017 Using ultrasound guidance, sterile technique, 1% lidocaine and an I-125 radioactive seed, the lesion in the 830 o'clock location 4 cm from the nipple was localized using a lateral approach. Order number of I-125 seed:  [id number]. Total activity: 7.672 millicuries Reference Date: 07/28/2017 The follow-up mammogram images confirm the seed in the expected location and were marked for Dr. Brantley Stage. Follow-up survey of the patient confirms presence of the radioactive seed. The patient tolerated the procedure well and was released from the [facility]. She was given instructions regarding seed removal. IMPRESSION: Radioactive seed localization of the right breast. No apparent complications. Electronically Signed   By: Nolon Nations M.D.   On: 08/14/2017 14:35   [place] Rt Radioactive Seed Ea Add Lesion  Result Date: 08/14/2017 CLINICAL DATA:  The patient presents for placement of radioactive seeds prior to lumpectomy for multifocal invasive ductal carcinoma in the right breast. EXAM: [place]  LOCALIZATION OF THE RIGHT BREAST x2 COMPARISON:  Previous exam(s). FINDINGS: Patient presents for radioactive seed localization prior to lumpectomy. Prior to the procedure, I discussed the options with Dr. Brantley Stage . We discussed identifying the lesion not yet biopsied in the 9 o'clock location of the right breast 5 cm from the nipple with a radioactive seed. A second radioactive seed will be placed in 1 of the previously biopsied lesions in the 830 o'clock location. I met with the patient and we discussed the procedure of seed localization including benefits and alternatives. We discussed the high likelihood of a successful procedure. We discussed the risks of the procedure including infection, bleeding, tissue injury and further surgery. We discussed the low dose of radioactivity involved in the procedure. Informed, written consent was given. The  usual time-out protocol was performed immediately prior to the procedure. Using ultrasound guidance, sterile technique, 1% lidocaine and an I-125 radioactive seed, the lesion in the 9 o'clock location 5 cm from the nipple (not previously biopsied) was localized using a lateral approach. Order number of I-125 seed:  [id number]. Total activity: 4.562 millicuries reference Date: 07/28/2017 Using ultrasound guidance, sterile technique, 1% lidocaine and an I-125 radioactive seed, the lesion in the 830 o'clock location 4 cm from the nipple was localized using a lateral approach. Order number of I-125 seed:  [id number]. Total activity: 2.876 millicuries Reference Date: 07/28/2017 The follow-up mammogram images confirm the seed in the expected location and were marked for Dr. Brantley Stage. Follow-up survey of the patient confirms presence of the radioactive seed. The patient tolerated the procedure well and was released from the Breast Center. She was given instructions regarding seed removal. IMPRESSION: Radioactive seed localization of the right breast. No apparent complications. Electronically Signed   By: Nolon Nations M.D.   On: 08/14/2017 14:35   Mm Clip Placement Right  Result Date: 08/14/2017 CLINICAL DATA:  Status post placement of 2 radioactive seeds for lumpectomy of the right breast. EXAM: DIAGNOSTIC RIGHT MAMMOGRAM POST ULTRASOUND-GUIDED RADIOACTIVE SEED PLACEMENT COMPARISON:  Previous exam(s). FINDINGS: Mammographic images were obtained following ultrasound-guided radioactive seed placement. These demonstrate: 1. A radioactive seed within the upper-outer quadrant of the right breast, marking the lesion in the 9 o'clock location of the right breast 5 cm from nipple, not yet biopsied. 2. A second seed is adjacent to the ribbon shaped clip, marking the lesion in the 830 o'clock location of the right breast 4 cm from the nipple, known to represent invasive ductal carcinoma/DCIS. 3. A third lesion known to  represent invasive ductal carcinoma is marked with a coil shaped clip but has no adjacent radioactive seed. IMPRESSION: Appropriate location of the radioactive seeds. Final Assessment: Post Procedure Mammograms for Seed Placement Electronically Signed   By: Nolon Nations M.D.   On: 08/14/2017 14:36   Mm Clip Placement Right  Result Date: 08/03/2017 CLINICAL DATA:  Post biopsy mammogram of the right breast for clip placement. EXAM: DIAGNOSTIC RIGHT MAMMOGRAM POST ULTRASOUND BIOPSY COMPARISON:  Previous exam(s). FINDINGS: Mammographic images were obtained following ultrasound-guided biopsy of 2 sites in the lower outer right breast. The ribbon shaped biopsy marking clip is appropriately positioned at the site of biopsy in the lower outer right breast closer to the nipple (830, 4 cm from the nipple). The coil shaped biopsy marking clip is appropriately positioned at the second site of biopsy in the lower outer quadrant slightly farther from the nipple (830, 5 cm from the nipple). IMPRESSION: 1. Expected positioning of the 2 biopsy marking  clips in the lower outer quadrant of the right breast. Final Assessment: Post Procedure Mammograms for Marker Placement Electronically Signed   By: Ammie Ferrier M.D.   On: 08/03/2017 09:39   [place] Rt Breast Bx W Loc Dev 1st Lesion Img Bx Spec US Guide  Addendum Date: 08/03/2017   ADDENDUM REPORT: 08/03/2017 15:31 ADDENDUM: Pathology revealed GRADE II INVASIVE DUCTAL CARCINOMA, DUCTAL CARCINOMA IN SITU, LYMPHOVASCULAR INVASION PRESENT of the Right breast, 8:30 o'clock, 4CMFN. GRADE II INVASIVE DUCTAL CARCINOMA of the Right breast, 8:30 o'clock, 5CMFN . This was found to be concordant by Dr. Ammie Ferrier. Pathology results were discussed with the patient by telephone. The patient reported doing well after the biopsies with tenderness at the sites. Post biopsy instructions and care were reviewed and questions were answered. The patient was encouraged to call [facility] for any additional concerns. Surgical consultation has been arranged with Dr. Erroll Luna at [facility] on August 09, 2017. Recommendation for a bilateral breast MRI for evaluation of extent of disease. Pathology results reported by Terie Purser, RN on 08/03/2017. Electronically Signed   By: Ammie Ferrier M.D.   On: 08/03/2017 15:31   Result Date: 08/03/2017 CLINICAL DATA:  61 year old female presenting for ultrasound-guided biopsy of the right breast. EXAM: ULTRASOUND GUIDED RIGHT BREAST CORE NEEDLE BIOPSY COMPARISON:  Previous exam(s). FINDINGS: I met with the patient and we discussed the procedure of ultrasound-guided biopsy, including benefits and alternatives. We discussed the high likelihood of a successful procedure. We discussed the risks of the procedure, including infection, bleeding, tissue injury, clip migration, and inadequate sampling. Informed written consent was given. The usual time-out protocol was performed immediately prior to the procedure. Lesion quadrant: Lower outer quadrant Using sterile technique and 1% Lidocaine as local anesthetic, under direct ultrasound visualization, a 14 gauge spring-loaded device was used to perform biopsy of a mass in the right breast at 8:30, 4 cm from the nipple using inferior approach. At the conclusion of the procedure a ribbon shaped tissue marker clip was deployed into the biopsy cavity. Lesion quadrant: Lower outer core Using sterile technique and 1% Lidocaine as local anesthetic, under direct ultrasound visualization, a 14 gauge spring-loaded device was used to perform biopsy of a mass in the right breast at 8:30, 5 cm from the nipple using an inferior approach through the same incision site. At the conclusion of the procedure a coil shaped tissue marker clip was deployed into the biopsy cavity. Follow up 2 view mammogram was performed and dictated separately. IMPRESSION: 1. Ultrasound guided biopsy of right  breast mass at 8:30, 4 cm from the nipple. No apparent complications. 2. Ultrasound-guided biopsy of a right breast mass at 8:30, 5 cm from the nipple. No apparent complications. Electronically Signed: By: Ammie Ferrier M.D. On: 08/03/2017 09:38   [place] Rt Breast Bx W Loc Dev Ea Add Lesion Img Bx Spec US Guide  Addendum Date: 08/03/2017   ADDENDUM REPORT: 08/03/2017 15:31 ADDENDUM: Pathology revealed GRADE II INVASIVE DUCTAL CARCINOMA, DUCTAL CARCINOMA IN SITU, LYMPHOVASCULAR INVASION PRESENT of the Right breast, 8:30 o'clock, 4CMFN. GRADE II INVASIVE DUCTAL CARCINOMA of the Right breast, 8:30 o'clock, 5CMFN . This was found to be concordant by Dr. Ammie Ferrier. Pathology results were discussed with the patient by telephone. The patient reported doing well after the biopsies with tenderness at the sites. Post biopsy instructions and care were reviewed and questions were answered. The patient was encouraged to call [facility] for any additional concerns. Surgical consultation has been arranged with Dr. Erroll Luna at [facility] on August 09, 2017. Recommendation for a bilateral breast MRI for evaluation of extent of disease. Pathology results reported by Terie Purser, RN on 08/03/2017. Electronically Signed   By: Ammie Ferrier M.D.   On: 08/03/2017 15:31   Result Date: 08/03/2017 CLINICAL DATA:  61 year old female presenting for ultrasound-guided biopsy of the right breast. EXAM: ULTRASOUND GUIDED RIGHT BREAST CORE NEEDLE BIOPSY COMPARISON:  Previous exam(s). FINDINGS: I met with the patient and we discussed the procedure of ultrasound-guided biopsy, including benefits and alternatives. We discussed the high likelihood of a successful procedure. We discussed the risks of the procedure, including infection, bleeding, tissue injury, clip migration, and inadequate sampling. Informed written consent was given. The usual time-out protocol was performed immediately  prior to the procedure. Lesion quadrant: Lower outer quadrant Using sterile technique and 1% Lidocaine as local anesthetic, under direct ultrasound visualization, a 14 gauge spring-loaded device was used to perform biopsy of a mass in the right breast at 8:30, 4 cm from the nipple using inferior approach. At the conclusion of the procedure a ribbon shaped tissue marker clip was deployed into the biopsy cavity. Lesion quadrant: Lower outer core Using sterile technique and 1% Lidocaine as local anesthetic, under direct ultrasound visualization, a 14 gauge spring-loaded device was used to perform biopsy of a mass in the right breast at 8:30, 5 cm from the nipple using an inferior approach through the same incision site. At the conclusion of the procedure a coil shaped tissue marker clip was deployed into the biopsy cavity. Follow up 2 view mammogram was performed and dictated separately. IMPRESSION: 1. Ultrasound guided biopsy of right breast mass at 8:30, 4 cm from the nipple. No apparent complications. 2. Ultrasound-guided biopsy of a right breast mass at 8:30, 5 cm from the nipple. No apparent complications. Electronically Signed: By: Ammie Ferrier M.D. On: 08/03/2017 09:38      IMPRESSION/PLAN: right breast cancer  It was a pleasure meeting the patient today. We discussed the risks, benefits, and side effects of radiotherapy. I recommend radiotherapy to the right breast and regional nodes to reduce her risk of locoregional recurrence by 2/3.  We discussed that radiation would take approximately 6 weeks to complete and that I would give the patient a few weeks to heal following surgery before starting treatment planning. If chemotherapy were to be given, this would precede radiotherapy. We spoke about acute effects including skin irritation and fatigue as well as much less common late effects including internal organ injury or irritation. We spoke about the latest technology that is used to minimize the  risk of late effects for patients undergoing radiotherapy to the breast or chest wall. No guarantees of treatment were given. The patient is enthusiastic about proceeding with treatment. I look forward to participating in the patient's care. I will await her referral back to me for postoperative follow-up and eventual CT simulation/treatment planning. Consent signed today.  I asked the patient today about tobacco use. The patient still uses tobacco.  I advised the patient to quit. Services were offered by me today including outpatient counseling and pharmacotherapy. I assessed for the willingness to attempt to quit and provided encouragement and demonstrated willingness to make referrals and/or prescriptions to help the patient attempt to quit. The patient has follow-up with the oncologic team to touch base on their tobacco use and /or cessation efforts.  She will purchase  42m Nicotine patches and she will consider calling 1-800-QUITNOW and she voices that she plans to quit smoking on November 1st. Over 3 minutes were spent on this issue.  She sees med/onc on 9-6 and understands chemotherapy may be recommended after more testing. I'll await her referral back by Dr MJana Hakim   __________________________________________   SEppie Gibson MD  This document serves as a record of services personally performed by SEppie Gibson MD. It was created on his behalf by WReola Mosher a trained medical scribe. The creation of this record is based on the scribe's personal observations and the provider's statements to them. This document has been checked and approved by the attending provider.

## 2017-08-30 NOTE — Progress Notes (Signed)
Patient will stop eliquis 5 days before surgery

## 2017-08-31 ENCOUNTER — Other Ambulatory Visit (HOSPITAL_BASED_OUTPATIENT_CLINIC_OR_DEPARTMENT_OTHER): Payer: 59

## 2017-08-31 ENCOUNTER — Ambulatory Visit (HOSPITAL_BASED_OUTPATIENT_CLINIC_OR_DEPARTMENT_OTHER): Payer: 59 | Admitting: Oncology

## 2017-08-31 VITALS — BP 102/69 | HR 88 | Temp 99.2°F | Resp 17 | Ht 64.0 in | Wt 165.8 lb

## 2017-08-31 DIAGNOSIS — C50411 Malignant neoplasm of upper-outer quadrant of right female breast: Secondary | ICD-10-CM

## 2017-08-31 DIAGNOSIS — C50511 Malignant neoplasm of lower-outer quadrant of right female breast: Secondary | ICD-10-CM

## 2017-08-31 DIAGNOSIS — Z170 Estrogen receptor positive status [ER+]: Secondary | ICD-10-CM

## 2017-08-31 DIAGNOSIS — I10 Essential (primary) hypertension: Secondary | ICD-10-CM | POA: Diagnosis not present

## 2017-08-31 DIAGNOSIS — C50919 Malignant neoplasm of unspecified site of unspecified female breast: Secondary | ICD-10-CM

## 2017-08-31 DIAGNOSIS — Z720 Tobacco use: Secondary | ICD-10-CM | POA: Diagnosis not present

## 2017-08-31 LAB — CBC WITH DIFFERENTIAL/PLATELET
BASO%: 0.6 % (ref 0.0–2.0)
BASOS ABS: 0 10*3/uL (ref 0.0–0.1)
EOS%: 1.7 % (ref 0.0–7.0)
Eosinophils Absolute: 0.1 10*3/uL (ref 0.0–0.5)
HCT: 34.1 % — ABNORMAL LOW (ref 34.8–46.6)
HEMOGLOBIN: 11.4 g/dL — AB (ref 11.6–15.9)
LYMPH#: 2.2 10*3/uL (ref 0.9–3.3)
LYMPH%: 34.4 % (ref 14.0–49.7)
MCH: 33.2 pg (ref 25.1–34.0)
MCHC: 33.4 g/dL (ref 31.5–36.0)
MCV: 99.4 fL (ref 79.5–101.0)
MONO#: 0.5 10*3/uL (ref 0.1–0.9)
MONO%: 7.1 % (ref 0.0–14.0)
NEUT#: 3.7 10*3/uL (ref 1.5–6.5)
NEUT%: 56.2 % (ref 38.4–76.8)
Platelets: 250 10*3/uL (ref 145–400)
RBC: 3.43 10*6/uL — ABNORMAL LOW (ref 3.70–5.45)
RDW: 13.7 % (ref 11.2–14.5)
WBC: 6.5 10*3/uL (ref 3.9–10.3)

## 2017-08-31 LAB — COMPREHENSIVE METABOLIC PANEL
ALBUMIN: 3.5 g/dL (ref 3.5–5.0)
ALT: 14 U/L (ref 0–55)
AST: 12 U/L (ref 5–34)
Alkaline Phosphatase: 76 U/L (ref 40–150)
Anion Gap: 11 mEq/L (ref 3–11)
BUN: 45.6 mg/dL — AB (ref 7.0–26.0)
CHLORIDE: 102 meq/L (ref 98–109)
CO2: 27 mEq/L (ref 22–29)
CREATININE: 1.6 mg/dL — AB (ref 0.6–1.1)
Calcium: 10 mg/dL (ref 8.4–10.4)
EGFR: 34 mL/min/{1.73_m2} — ABNORMAL LOW (ref >90)
GLUCOSE: 139 mg/dL (ref 70–140)
POTASSIUM: 3.9 meq/L (ref 3.5–5.1)
SODIUM: 140 meq/L (ref 136–145)
Total Bilirubin: 0.25 mg/dL (ref 0.20–1.20)
Total Protein: 7.2 g/dL (ref 6.4–8.3)

## 2017-08-31 NOTE — Progress Notes (Signed)
[facility]  Telephone:(336) [phone] Fax:([phone]) [phone]     ID: NASHALY DORANTES DOB: [birth year]  MR#: [id number]  KGS#:[id number]  Patient Care Team: Selinda Orion as PCP - General (Physician Assistant) Amaliya Whitelaw, Virgie Dad, MD as Consulting Physician (Oncology) Erroll Luna, MD as Consulting Physician (General Surgery) Leo Grosser, Seymour Bars, MD as Consulting Physician (Obstetrics and Gynecology) Donzetta Sprung., MD as Physician Assistant (Sports Medicine) Eppie Gibson, MD as Attending Physician (Radiation Oncology) Juanita Craver, MD as Consulting Physician (Gastroenterology) Thompson Grayer, MD as Consulting Physician (Cardiology) Chauncey Cruel, MD OTHER MD:  CHIEF COMPLAINT: Estrogen receptor positive breast cancer  CURRENT TREATMENT: Awaiting definitive surgery   HISTORY OF CURRENT ILLNESS: Brodie (last name is pronounced muh-REE--kuh) had routine screening mammography with tomography at the [facility] 07/21/2017, showing the breast density to be category C. An area of possible distortion was noted in the right breast, and on 07/28/2017 she underwent right diagnostic mammography with tomography and right breast ultrasonography. This confirmed an area of distortion in the outer right breast which by exam showed acid focal thickening at the 8:30 position, 4 cm from the nipple. Ultrasound showed 3 separate irregular hypoechoic areas in this region, measuring 0.9, 0.7 and 0.5 cm. Taken together these 2 areas at up to 2.1 cm in greatest diameter. There was no abnormal right axillary adenopathy.  2 of these masses were biopsied 08/02/2017. The final pathology ([id number]) showed both to consist of invasive ductal carcinoma, grade 2 with the prognostic panel (from the lesion at 8:30 o'clock 4 cm from the nipple) was estrogen receptor 90% positive, progesterone receptor 20% positive, both with strong staining intensity, with an MIB-1 of 15%, and no HER-2 complication,  the signals ratio being 1.00 and the number per cell 1.05.  The patient proceeded to right lumpectomy and axillary sentinel lymph node sampling 08/17/2017, with the final pathology ([id number]) showing 2 areas of invasive ductal carcinoma, grade 2, measuring 1.5 and 0.7 cm. Margins were broadly positive medially and focally laterally. The single sentinel lymph node was involved by tumor.  The patient's subsequent history is as detailed below.  INTERVAL HISTORY: Alasia was evaluated in the breast clinic 08/31/2017 accompanied by her husband Merry Proud. Her case was also presented at the multidisciplinary breast cancer conference on 08/09/2017. At that time a preliminary plan was proposed: Breast conserving surgery with sentinel lymph node sampling, genetics testing.   REVIEW OF SYSTEMS: She did well with the surgery, with some soreness, but no bleeding or fever issues. She has significant pain in the right hip. She is working with orthopedics on that, recently had an MRI (08/04/2017) which shows numerous subcortical cysts in the left acetabulum, but no evidence of avascular necrosis. 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 This limits her exercise options. In addition her work is sedentary. Aside from these issues a detailed review of systems today was benign  PAST MEDICAL HISTORY: Past Medical History:  Diagnosis Date  . Arthritis    hips  . Hypertension   . Paroxysmal atrial fibrillation ([place])     PAST SURGICAL HISTORY: Past Surgical History:  Procedure Laterality Date  . BREAST LUMPECTOMY WITH RADIOACTIVE SEED AND SENTINEL LYMPH NODE BIOPSY Right 08/17/2017   Procedure: RIGHT BREAST RADIOACTIVE SEED LOCALIZED LUMPECTOMY AND SENTINEL LYMPH NODE BIOPSY;  Surgeon: Erroll Luna, MD;  Location: [facility];  Service: General;  Laterality: Right;  . CESAREAN SECTION  [phone]  . HEMORRHOID SURGERY  20 years ago  . TOE SURGERY Right    5th 20  years ago    FAMILY HISTORY Family History  Problem Relation Age of Onset  . Breast cancer Cousin   The patient's father died from dementia at the age of 49. The patient's mother died from emphysema at the age of 59. The patient has 2 brothers, no sisters. On the paternal side a first cousin was diagnosed with breast cancer at the age of 34. There is no other breast ovarian or prostate cancer in the family but the patient is aware it is a small family. The patient's brother living in [place] had a melanoma diagnosed in his 34s  GYNECOLOGIC HISTORY:  Patient's last menstrual period was 11/25/2010. Menarche age 77, first live birth age 55, she is [place] P3. The middle child was stillborn. She stopped having periods in her late 25s. She did not take hormone replacement. She took oral contraceptives for approximately 15 years remotely with no complications.  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 Merry Proud is self-employed in IT sales professional in [employer]. Daughter Benjie Karvonen is currently living at home. She graduated from [employer] in exercise and sports signs. Son Ellard Artis is currently attending  [employer]. The patient is not a church attender.    ADVANCED DIRECTIVES:    HEALTH MAINTENANCE: Social History  Substance Use Topics  . Smoking status: Current Every Day Smoker    Packs/day: 0.50    Years: 40.00    Types: Cigarettes  . Smokeless tobacco: Never Used  . Alcohol use 4.2 oz/week    7 Glasses of wine per week     Comment: wine nightly     Colonoscopy:/ [facility]  PAP:  Bone density: remote   Allergies  Allergen Reactions  . Sulfa Antibiotics Other (See Comments)    Unknown, was child    Current Outpatient Prescriptions  Medication Sig Dispense Refill  . apixaban (ELIQUIS) 5 MG TABS tablet Take 1 tablet (5 mg total) by mouth 2 (two) times daily. 60 tablet 6  . buPROPion (WELLBUTRIN XL) 150 MG 24 hr tablet Take 150 mg by mouth daily.    .  fenofibrate 160 MG tablet Take 160 mg by mouth daily.    Marland Kitchen HYDROcodone-acetaminophen (NORCO/VICODIN) 5-325 MG tablet Take 1 tablet by mouth every 6 (six) hours as needed for moderate pain.    Marland Kitchen ibuprofen (ADVIL,MOTRIN) 800 MG tablet Take 1 tablet (800 mg total) by mouth every 8 (eight) hours as needed. 30 tablet 0  . metoprolol succinate (TOPROL-XL) 25 MG 24 hr tablet TAKE 1 TABLET DAILY 90 tablet 2  . valsartan-hydrochlorothiazide (DIOVAN-HCT) 160-12.5 MG tablet Take 1 tablet by mouth daily.    Marland Kitchen oxyCODONE (OXY IR/ROXICODONE) 5 MG immediate release tablet Take 1-2 tablets (5-10 mg total) by mouth every 6 (six) hours as needed for severe pain. (Patient not taking: Reported on 08/30/2017) 15 tablet 0   No current facility-administered medications for this visit.     OBJECTIVE:Middle-aged white woman who appears stated age  61:   08/31/17 1630  BP: 102/69  Pulse: 88  Resp: 17  Temp: 99.2 F (37.3 C)  SpO2: 99%     Body mass index is 28.46 kg/m.   Wt Readings from Last 3 Encounters:  08/31/17 165 lb 12.8 oz (75.2 kg)  08/30/17 166 lb 9.6 oz (75.6 kg)  08/17/17 166 lb (75.3 kg)      ECOG FS:2 - Symptomatic, <50%  confined to bed  Ocular: Sclerae unicteric, pupils round and equal Ear-nose-throat: Oropharynx clear and moist Lymphatic: No cervical or supraclavicular adenopathy Lungs no rales or rhonchi Heart regular rate and rhythm Abd soft, nontender, positive bowel sounds MSK no focal spinal tenderness, no joint edema Neuro: non-focal, well-oriented, appropriate affect Breasts: The right breast is status post lumpectomy and sentinel lymph node sampling. The cosmetic result is excellent. There is no erythema, dehiscence, or swelling. The left breast is benign. Both axillae are benign.   LAB RESULTS:  CMP     Component Value Date/Time   NA 140 08/31/2017 1616   K 3.9 08/31/2017 1616   CL 96 (L) 08/11/2017 1445   CO2 27 08/31/2017 1616   GLUCOSE 139 08/31/2017 1616   BUN  45.6 (H) 08/31/2017 1616   CREATININE 1.6 (H) 08/31/2017 1616   CALCIUM 10.0 08/31/2017 1616   PROT 7.2 08/31/2017 1616   ALBUMIN 3.5 08/31/2017 1616   AST 12 08/31/2017 1616   ALT 14 08/31/2017 1616   ALKPHOS 76 08/31/2017 1616   BILITOT 0.25 08/31/2017 1616   GFRNONAA 30 (L) 08/11/2017 1445   GFRAA 35 (L) 08/11/2017 1445    No results found for: TOTALPROTELP, ALBUMINELP, A1GS, A2GS, BETS, BETA2SER, GAMS, MSPIKE, SPEI  No results found for: KPAFRELGTCHN, LAMBDASER, KAPLAMBRATIO  Lab Results  Component Value Date   WBC 6.5 08/31/2017   NEUTROABS 3.7 08/31/2017   HGB 11.4 (L) 08/31/2017   HCT 34.1 (L) 08/31/2017   MCV 99.4 08/31/2017   PLT 250 08/31/2017      Chemistry      Component Value Date/Time   NA 140 08/31/2017 1616   K 3.9 08/31/2017 1616   CL 96 (L) 08/11/2017 1445   CO2 27 08/31/2017 1616   BUN 45.6 (H) 08/31/2017 1616   CREATININE 1.6 (H) 08/31/2017 1616      Component Value Date/Time   CALCIUM 10.0 08/31/2017 1616   ALKPHOS 76 08/31/2017 1616   AST 12 08/31/2017 1616   ALT 14 08/31/2017 1616   BILITOT 0.25 08/31/2017 1616       No results found for: LABCA2  No components found for: ZJQBHA193  No results for input(s): INR in the last 168 hours.  No results found for: LABCA2  No results found for: XTK240  No results found for: XBD532  No results found for: DJM426  No results found for: CA2729  No components found for: HGQUANT  No results found for: CEA1 / No results found for: CEA1   No results found for: AFPTUMOR  No results found for: CHROMOGRNA  No results found for: PSA1  Appointment on 08/31/2017  Component Date Value Ref Range Status  . WBC 08/31/2017 6.5  3.9 - 10.3 10e3/uL Final  . NEUT# 08/31/2017 3.7  1.5 - 6.5 10e3/uL Final  . HGB 08/31/2017 11.4* 11.6 - 15.9 g/dL Final  . HCT 08/31/2017 34.1* 34.8 - 46.6 % Final  . Platelets 08/31/2017 250  145 - 400 10e3/uL Final  . MCV 08/31/2017 99.4  79.5 - 101.0 fL Final  .  MCH 08/31/2017 33.2  25.1 - 34.0 pg Final  . MCHC 08/31/2017 33.4  31.5 - 36.0 g/dL Final  . RBC 08/31/2017 3.43* 3.70 - 5.45 10e6/uL Final  . RDW 08/31/2017 13.7  11.2 - 14.5 % Final  . lymph# 08/31/2017 2.2  0.9 - 3.3 10e3/uL Final  . MONO# 08/31/2017 0.5  0.1 - 0.9 10e3/uL Final  . Eosinophils Absolute 08/31/2017 0.1  0.0 - 0.5  10e3/uL Final  . Basophils Absolute 08/31/2017 0.0  0.0 - 0.1 10e3/uL Final  . NEUT% 08/31/2017 56.2  38.4 - 76.8 % Final  . LYMPH% 08/31/2017 34.4  14.0 - 49.7 % Final  . MONO% 08/31/2017 7.1  0.0 - 14.0 % Final  . EOS% 08/31/2017 1.7  0.0 - 7.0 % Final  . BASO% 08/31/2017 0.6  0.0 - 2.0 % Final  . Sodium 08/31/2017 140  136 - 145 mEq/L Final  . Potassium 08/31/2017 3.9  3.5 - 5.1 mEq/L Final  . Chloride 08/31/2017 102  98 - 109 mEq/L Final  . CO2 08/31/2017 27  22 - 29 mEq/L Final  . Glucose 08/31/2017 139  70 - 140 mg/dl Final   Glucose reference range is for nonfasting patients. Fasting glucose reference range is 70- 100.  Marland Kitchen BUN 08/31/2017 45.6* 7.0 - 26.0 mg/dL Final  . Creatinine 08/31/2017 1.6* 0.6 - 1.1 mg/dL Final  . Total Bilirubin 08/31/2017 0.25  0.20 - 1.20 mg/dL Final  . Alkaline Phosphatase 08/31/2017 76  40 - 150 U/L Final  . AST 08/31/2017 12  5 - 34 U/L Final  . ALT 08/31/2017 14  0 - 55 U/L Final  . Total Protein 08/31/2017 7.2  6.4 - 8.3 g/dL Final  . Albumin 08/31/2017 3.5  3.5 - 5.0 g/dL Final  . Calcium 08/31/2017 10.0  8.4 - 10.4 mg/dL Final  . Anion Gap 08/31/2017 11  3 - 11 mEq/L Final  . EGFR 08/31/2017 34* >90 ml/min/1.73 m2 Final   eGFR is calculated using the CKD-EPI Creatinine Equation (2009)    (this displays the last labs from the last 3 days)  No results found for: TOTALPROTELP, ALBUMINELP, A1GS, A2GS, BETS, BETA2SER, GAMS, MSPIKE, SPEI (this displays SPEP labs)  No results found for: KPAFRELGTCHN, LAMBDASER, KAPLAMBRATIO (kappa/lambda light chains)  No results found for: HGBA, HGBA2QUANT, HGBFQUANT,  HGBSQUAN (Hemoglobinopathy evaluation)   No results found for: LDH  No results found for: IRON, TIBC, IRONPCTSAT (Iron and TIBC)  No results found for: FERRITIN  Urinalysis No results found for: COLORURINE, APPEARANCEUR, LABSPEC, PHURINE, GLUCOSEU, HGBUR, BILIRUBINUR, KETONESUR, PROTEINUR, UROBILINOGEN, NITRITE, LEUKOCYTESUR   STUDIES: Mm Breast Surgical Specimen  Result Date: 08/17/2017 CLINICAL DATA:  Two radioactive seeds were placed in the right breast on 08/14/2017 prior to surgery. The patient has a recent diagnosis of right breast cancer. EXAM: SPECIMEN RADIOGRAPH OF THE RIGHT BREAST COMPARISON:  Previous exam(s). FINDINGS: Status post excision of the right breast. Two radioactive seeds, a ribbon shaped biopsy marker clip, and a coil shaped biopsy marker clip are present, completely intact, and were marked for pathology. IMPRESSION: Specimen radiograph of the right breast. Electronically Signed   By: Curlene Dolphin M.D.   On: 08/17/2017 09:54   [place] Rt Radioactive Seed Loc  Result Date: 08/14/2017 CLINICAL DATA:  The patient presents for placement of radioactive seeds prior to lumpectomy for multifocal invasive ductal carcinoma in the right breast. EXAM: ULTRASOUND GUIDED RADIOACTIVE SEED LOCALIZATION OF THE RIGHT BREAST x2 COMPARISON:  Previous exam(s). FINDINGS: Patient presents for radioactive seed localization prior to lumpectomy. Prior to the procedure, I discussed the options with Dr. Brantley Stage . We discussed identifying the lesion not yet biopsied in the 9 o'clock location of the right breast 5 cm from the nipple with a radioactive seed. A second radioactive seed will be placed in 1 of the previously biopsied lesions in the 830 o'clock location. I met with the patient and we discussed the procedure of seed localization  including benefits and alternatives. We discussed the high likelihood of a successful procedure. We discussed the risks of the procedure including infection, bleeding,  tissue injury and further surgery. We discussed the low dose of radioactivity involved in the procedure. Informed, written consent was given. The usual time-out protocol was performed immediately prior to the procedure. Using ultrasound guidance, sterile technique, 1% lidocaine and an I-125 radioactive seed, the lesion in the 9 o'clock location 5 cm from the nipple (not previously biopsied) was localized using a lateral approach. Order number of I-125 seed:  [id number]. Total activity: 3.382 millicuries reference Date: 07/28/2017 Using ultrasound guidance, sterile technique, 1% lidocaine and an I-125 radioactive seed, the lesion in the 830 o'clock location 4 cm from the nipple was localized using a lateral approach. Order number of I-125 seed:  [id number]. Total activity: 4.193 millicuries Reference Date: 07/28/2017 The follow-up mammogram images confirm the seed in the expected location and were marked for Dr. Brantley Stage. Follow-up survey of the patient confirms presence of the radioactive seed. The patient tolerated the procedure well and was released from the Breast Center. She was given instructions regarding seed removal. IMPRESSION: Radioactive seed localization of the right breast. No apparent complications. Electronically Signed   By: Nolon Nations M.D.   On: 08/14/2017 14:35   [place] Rt Radioactive Seed Ea Add Lesion  Result Date: 08/14/2017 CLINICAL DATA:  The patient presents for placement of radioactive seeds prior to lumpectomy for multifocal invasive ductal carcinoma in the right breast. EXAM: ULTRASOUND GUIDED RADIOACTIVE SEED LOCALIZATION OF THE RIGHT BREAST x2 COMPARISON:  Previous exam(s). FINDINGS: Patient presents for radioactive seed localization prior to lumpectomy. Prior to the procedure, I discussed the options with Dr. Brantley Stage . We discussed identifying the lesion not yet biopsied in the 9 o'clock location of the right breast 5 cm from the nipple with a radioactive seed. A second radioactive  seed will be placed in 1 of the previously biopsied lesions in the 830 o'clock location. I met with the patient and we discussed the procedure of seed localization including benefits and alternatives. We discussed the high likelihood of a successful procedure. We discussed the risks of the procedure including infection, bleeding, tissue injury and further surgery. We discussed the low dose of radioactivity involved in the procedure. Informed, written consent was given. The usual time-out protocol was performed immediately prior to the procedure. Using ultrasound guidance, sterile technique, 1% lidocaine and an I-125 radioactive seed, the lesion in the 9 o'clock location 5 cm from the nipple (not previously biopsied) was localized using a lateral approach. Order number of I-125 seed:  [id number]. Total activity: 5.329 millicuries reference Date: 07/28/2017 Using ultrasound guidance, sterile technique, 1% lidocaine and an I-125 radioactive seed, the lesion in the 830 o'clock location 4 cm from the nipple was localized using a lateral approach. Order number of I-125 seed:  [id number]. Total activity: 9.622 millicuries Reference Date: 07/28/2017 The follow-up mammogram images confirm the seed in the expected location and were marked for Dr. Brantley Stage. Follow-up survey of the patient confirms presence of the radioactive seed. The patient tolerated the procedure well and was released from the [facility]. She was given instructions regarding seed removal. IMPRESSION: Radioactive seed localization of the right breast. No apparent complications. Electronically Signed   By: Nolon Nations M.D.   On: 08/14/2017 14:35   Mm Clip Placement Right  Result Date: 08/14/2017 CLINICAL DATA:  Status post placement of 2 radioactive seeds for lumpectomy of the right breast. EXAM: DIAGNOSTIC RIGHT  MAMMOGRAM POST ULTRASOUND-GUIDED RADIOACTIVE SEED PLACEMENT COMPARISON:  Previous exam(s). FINDINGS: Mammographic images were obtained  following ultrasound-guided radioactive seed placement. These demonstrate: 1. A radioactive seed within the upper-outer quadrant of the right breast, marking the lesion in the 9 o'clock location of the right breast 5 cm from nipple, not yet biopsied. 2. A second seed is adjacent to the ribbon shaped clip, marking the lesion in the 830 o'clock location of the right breast 4 cm from the nipple, known to represent invasive ductal carcinoma/DCIS. 3. A third lesion known to represent invasive ductal carcinoma is marked with a coil shaped clip but has no adjacent radioactive seed. IMPRESSION: Appropriate location of the radioactive seeds. Final Assessment: Post Procedure Mammograms for Seed Placement Electronically Signed   By: Nolon Nations M.D.   On: 08/14/2017 14:36   Mm Clip Placement Right  Result Date: 08/03/2017 CLINICAL DATA:  Post biopsy mammogram of the right breast for clip placement. EXAM: DIAGNOSTIC RIGHT MAMMOGRAM POST ULTRASOUND BIOPSY COMPARISON:  Previous exam(s). FINDINGS: Mammographic images were obtained following ultrasound-guided biopsy of 2 sites in the lower outer right breast. The ribbon shaped biopsy marking clip is appropriately positioned at the site of biopsy in the lower outer right breast closer to the nipple (830, 4 cm from the nipple). The coil shaped biopsy marking clip is appropriately positioned at the second site of biopsy in the lower outer quadrant slightly farther from the nipple (830, 5 cm from the nipple). IMPRESSION: 1. Expected positioning of the 2 biopsy marking clips in the lower outer quadrant of the right breast. Final Assessment: Post Procedure Mammograms for Marker Placement Electronically Signed   By: Ammie Ferrier M.D.   On: 08/03/2017 09:39   [place] Rt Breast Bx W Loc Dev 1st Lesion Img Bx Spec US Guide  Addendum Date: 08/03/2017   ADDENDUM REPORT: 08/03/2017 15:31 ADDENDUM: Pathology revealed GRADE II INVASIVE DUCTAL CARCINOMA, DUCTAL CARCINOMA IN SITU,  LYMPHOVASCULAR INVASION PRESENT of the Right breast, 8:30 o'clock, 4CMFN. GRADE II INVASIVE DUCTAL CARCINOMA of the Right breast, 8:30 o'clock, 5CMFN . This was found to be concordant by Dr. Ammie Ferrier. Pathology results were discussed with the patient by telephone. The patient reported doing well after the biopsies with tenderness at the sites. Post biopsy instructions and care were reviewed and questions were answered. The patient was encouraged to call [facility] for any additional concerns. Surgical consultation has been arranged with Dr. Erroll Luna at [facility] on August 09, 2017. Recommendation for a bilateral breast MRI for evaluation of extent of disease. Pathology results reported by Terie Purser, RN on 08/03/2017. Electronically Signed   By: Ammie Ferrier M.D.   On: 08/03/2017 15:31   Result Date: 08/03/2017 CLINICAL DATA:  61 year old female presenting for ultrasound-guided biopsy of the right breast. EXAM: ULTRASOUND GUIDED RIGHT BREAST CORE NEEDLE BIOPSY COMPARISON:  Previous exam(s). FINDINGS: I met with the patient and we discussed the procedure of ultrasound-guided biopsy, including benefits and alternatives. We discussed the high likelihood of a successful procedure. We discussed the risks of the procedure, including infection, bleeding, tissue injury, clip migration, and inadequate sampling. Informed written consent was given. The usual time-out protocol was performed immediately prior to the procedure. Lesion quadrant: Lower outer quadrant Using sterile technique and 1% Lidocaine as local anesthetic, under direct ultrasound visualization, a 14 gauge spring-loaded device was used to perform biopsy of a mass in the right breast at 8:30, 4 cm from the nipple using inferior  approach. At the conclusion of the procedure a ribbon shaped tissue marker clip was deployed into the biopsy cavity. Lesion quadrant: Lower outer core Using sterile technique  and 1% Lidocaine as local anesthetic, under direct ultrasound visualization, a 14 gauge spring-loaded device was used to perform biopsy of a mass in the right breast at 8:30, 5 cm from the nipple using an inferior approach through the same incision site. At the conclusion of the procedure a coil shaped tissue marker clip was deployed into the biopsy cavity. Follow up 2 view mammogram was performed and dictated separately. IMPRESSION: 1. Ultrasound guided biopsy of right breast mass at 8:30, 4 cm from the nipple. No apparent complications. 2. Ultrasound-guided biopsy of a right breast mass at 8:30, 5 cm from the nipple. No apparent complications. Electronically Signed: By: Ammie Ferrier M.D. On: 08/03/2017 09:38   [place] Rt Breast Bx W Loc Dev Ea Add Lesion Img Bx Spec US Guide  Addendum Date: 08/03/2017   ADDENDUM REPORT: 08/03/2017 15:31 ADDENDUM: Pathology revealed GRADE II INVASIVE DUCTAL CARCINOMA, DUCTAL CARCINOMA IN SITU, LYMPHOVASCULAR INVASION PRESENT of the Right breast, 8:30 o'clock, 4CMFN. GRADE II INVASIVE DUCTAL CARCINOMA of the Right breast, 8:30 o'clock, 5CMFN . This was found to be concordant by Dr. Ammie Ferrier. Pathology results were discussed with the patient by telephone. The patient reported doing well after the biopsies with tenderness at the sites. Post biopsy instructions and care were reviewed and questions were answered. The patient was encouraged to call [facility] for any additional concerns. Surgical consultation has been arranged with Dr. Erroll Luna at [facility] on August 09, 2017. Recommendation for a bilateral breast MRI for evaluation of extent of disease. Pathology results reported by Terie Purser, RN on 08/03/2017. Electronically Signed   By: Ammie Ferrier M.D.   On: 08/03/2017 15:31   Result Date: 08/03/2017 CLINICAL DATA:  61 year old female presenting for ultrasound-guided biopsy of the right breast. EXAM: ULTRASOUND  GUIDED RIGHT BREAST CORE NEEDLE BIOPSY COMPARISON:  Previous exam(s). FINDINGS: I met with the patient and we discussed the procedure of ultrasound-guided biopsy, including benefits and alternatives. We discussed the high likelihood of a successful procedure. We discussed the risks of the procedure, including infection, bleeding, tissue injury, clip migration, and inadequate sampling. Informed written consent was given. The usual time-out protocol was performed immediately prior to the procedure. Lesion quadrant: Lower outer quadrant Using sterile technique and 1% Lidocaine as local anesthetic, under direct ultrasound visualization, a 14 gauge spring-loaded device was used to perform biopsy of a mass in the right breast at 8:30, 4 cm from the nipple using inferior approach. At the conclusion of the procedure a ribbon shaped tissue marker clip was deployed into the biopsy cavity. Lesion quadrant: Lower outer core Using sterile technique and 1% Lidocaine as local anesthetic, under direct ultrasound visualization, a 14 gauge spring-loaded device was used to perform biopsy of a mass in the right breast at 8:30, 5 cm from the nipple using an inferior approach through the same incision site. At the conclusion of the procedure a coil shaped tissue marker clip was deployed into the biopsy cavity. Follow up 2 view mammogram was performed and dictated separately. IMPRESSION: 1. Ultrasound guided biopsy of right breast mass at 8:30, 4 cm from the nipple. No apparent complications. 2. Ultrasound-guided biopsy of a right breast mass at 8:30, 5 cm from the nipple. No apparent complications. Electronically Signed: By: Ammie Ferrier M.D. On: 08/03/2017 09:38  ELIGIBLE FOR AVAILABLE RESEARCH PROTOCOL: no  ASSESSMENT: 61 y.o. 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  (1) status post right lumpectomy and axillary sentinel lymph node sampling 08/17/2017 for an mpT1c pN1, stage IB invasive ductal carcinoma, grade 2, with positive margins  (2) additional surgery planned 09/06/2017  (3) Mammaprint will be obtained from the original lumpectomy site to clarify the chemotherapy decision  (4) adjuvant radiation to follow  (5) anti-estrogens to start at the completion of local treatment   (6) genetics testing pending  PLAN: We spent the better part of today's hour-long appointment discussing the biology of her diagnosis and the specifics of her situation. We first reviewed the fact that cancer is not one disease but more than 100 different diseases and that it is important to keep them separate-- otherwise when friends and relatives discuss their own cancer experiences with Rhiann confusion can result. Similarly we explained that if breast cancer spreads to the bone or liver, the patient would not have bone cancer or liver cancer, but breast cancer in the bone and breast cancer in the liver: one cancer in three places-- not 3 different cancers which otherwise would have to be treated in 3 different ways.  We discussed the difference between local and systemic therapy. In terms of loco-regional treatment, lumpectomy plus radiation is equivalent to mastectomy as far as survival is concerned. For this reason, and because the cosmetic results are generally superior, we recommended breast conserving surgery. She understands she will need additional surgery for margin clearance.  We then discussed the rationale for systemic therapy. There is some risk that this cancer may have already spread to other parts of her body. Patients frequently ask at this point about bone scans, CAT scans and PET scans to find out if they have occult breast cancer somewhere else. The problem is that in early stage disease we are much more likely to find  false positives then true cancers and this would expose the patient to unnecessary procedures as well as unnecessary radiation. Scans cannot answer the question the patient really would like to know, which is whether she has microscopic disease elsewhere in her body. For those reasons we do not recommend them.  Of course we would proceed to aggressive evaluation of any symptoms that might suggest metastatic disease, but that is not the case here.  Next we went over the options for systemic therapy which are anti-estrogens, anti-HER-2 immunotherapy, and chemotherapy. Hailey does not meet criteria for anti-HER-2 immunotherapy. She is a good candidate for anti-estrogens.  The question of chemotherapy is more complicated. Chemotherapy is most effective in rapidly growing, aggressive tumors. It is much less effective in intermediate grade, slow growing cancers, like Allean 's. For that reason we are going to request a Mammaprint from the definitive surgical sample. If it comes back high risk we will recommend chemotherapy with doxorubicin and cyclophosphamide in dose dense fashion 4 followed by paclitaxel weekly 12 (total of 5 months). If it is low risk, she will proceed directly to radiation.  We also discussed the fact that she meets criteria for genetics testing. In patients who carry a deleterious mutation [for example in a  BRCA gene], the risk of a new breast cancer developing in the future may be sufficiently great that the patient may choose bilateral mastectomies. However  if she wishes to keep her breasts in that situation it is safe to do so. That would require intensified screening, which generally means not only yearly mammography but a yearly breast MRI as well. After this discussion Lalitha made it clear that if she does carry a deleterious mutation she would not be interested in bilateral mastectomies but would choose intensified screening. Accordingly there is no reason to postpone her upcoming  surgery while waiting for genetics testing results, which may take another month.  The plan then is for additional surgery next week, Mammaprint from the 08/17/2017 sampled, chemotherapy if appropriate, radiation to follow, and then anti-estrogens for 5 years. Genetics testing is also being scheduled  Incidentally the patient is interested in receiving a cortisone shot to the right hip. I have no problems with that at this point. When she starts chemotherapy, if she receives chemotherapy, that may become more of an issue.  Glenisha has a good understanding of the overall plan. She agrees with it. She knows the goal of treatment in her case is cure. She will call with any problems that may develop before her next visit here.  Chauncey Cruel, MD   08/31/2017 5:49 PM Medical Oncology and Hematology [facility] [address] [place], [place] [address] Tel. [phone]    Fax. [phone]

## 2017-09-01 ENCOUNTER — Telehealth: Payer: Self-pay | Admitting: *Deleted

## 2017-09-01 NOTE — Telephone Encounter (Signed)
Ordered mammaprint per Dr. Jana Hakim.  Faxed requisition to pathology and [facility] and confirmed receipt.

## 2017-09-01 NOTE — Telephone Encounter (Signed)
  Oncology Nurse Navigator Documentation  Navigator Location: [facility] (09/01/17 1500) Referral date to RadOnc/MedOnc: 08/15/17 (09/01/17 1500) )Navigator Encounter Type: Introductory phone call (09/01/17 1500)   Abnormal Finding Date: 07/21/17 (09/01/17 1500) Confirmed Diagnosis Date: 08/02/17 (09/01/17 1500) Surgery Date: 08/17/17 (09/01/17 1500) Genetic Counseling Date: 09/18/17 (09/01/17 1500) Genetic Counseling Type: Non-Urgent (09/01/17 1500)       Treatment Initiated Date: 08/17/17 (09/01/17 1500) Patient Visit Type: MedOnc;Initial (09/01/17 1500)                    Acuity: Level 2 (09/01/17 1500)   Acuity Level 2: Initial guidance, education and coordination as needed;Educational needs;Assistance expediting appointments;Ongoing guidance and education throughout treatment as needed (09/01/17 1500)     Time Spent with Patient: 15 (09/01/17 1500)

## 2017-09-05 ENCOUNTER — Encounter (HOSPITAL_BASED_OUTPATIENT_CLINIC_OR_DEPARTMENT_OTHER)
Admission: RE | Admit: 2017-09-05 | Discharge: 2017-09-05 | Disposition: A | Discharge status: Home / Self Care | Payer: 59 | Source: Ambulatory Visit | Attending: Surgery | Admitting: Surgery

## 2017-09-05 DIAGNOSIS — C50911 Malignant neoplasm of unspecified site of right female breast: Secondary | ICD-10-CM | POA: Diagnosis not present

## 2017-09-05 DIAGNOSIS — Z79899 Other long term (current) drug therapy: Secondary | ICD-10-CM | POA: Diagnosis not present

## 2017-09-05 DIAGNOSIS — F17210 Nicotine dependence, cigarettes, uncomplicated: Secondary | ICD-10-CM | POA: Diagnosis not present

## 2017-09-05 DIAGNOSIS — I10 Essential (primary) hypertension: Secondary | ICD-10-CM | POA: Diagnosis not present

## 2017-09-05 DIAGNOSIS — Z7901 Long term (current) use of anticoagulants: Secondary | ICD-10-CM | POA: Diagnosis not present

## 2017-09-05 DIAGNOSIS — I480 Paroxysmal atrial fibrillation: Secondary | ICD-10-CM | POA: Diagnosis not present

## 2017-09-05 LAB — BASIC METABOLIC PANEL
ANION GAP: 8 (ref 5–15)
BUN: 40 mg/dL — ABNORMAL HIGH (ref 6–20)
CO2: 27 mmol/L (ref 22–32)
Calcium: 9.5 mg/dL (ref 8.9–10.3)
Chloride: 103 mmol/L (ref 101–111)
Creatinine, Ser: 1.51 mg/dL — ABNORMAL HIGH (ref 0.44–1.00)
GFR, EST AFRICAN AMERICAN: 43 mL/min — AB (ref >60)
GFR, EST NON AFRICAN AMERICAN: 37 mL/min — AB (ref >60)
Glucose, Bld: 96 mg/dL (ref 65–99)
POTASSIUM: 5.7 mmol/L — AB (ref 3.5–5.1)
SODIUM: 138 mmol/L (ref 135–145)

## 2017-09-06 ENCOUNTER — Encounter (HOSPITAL_BASED_OUTPATIENT_CLINIC_OR_DEPARTMENT_OTHER): Payer: Self-pay | Admitting: *Deleted

## 2017-09-06 ENCOUNTER — Ambulatory Visit (HOSPITAL_BASED_OUTPATIENT_CLINIC_OR_DEPARTMENT_OTHER)
Admission: RE | Admit: 2017-09-06 | Discharge: 2017-09-06 | Disposition: A | Discharge status: Home / Self Care | Payer: 59 | Source: Ambulatory Visit | Attending: Surgery | Admitting: Surgery

## 2017-09-06 ENCOUNTER — Encounter (HOSPITAL_BASED_OUTPATIENT_CLINIC_OR_DEPARTMENT_OTHER)
Admission: RE | Disposition: A | Discharge status: Home / Self Care | Payer: Self-pay | Source: Ambulatory Visit | Attending: Surgery

## 2017-09-06 ENCOUNTER — Ambulatory Visit (HOSPITAL_BASED_OUTPATIENT_CLINIC_OR_DEPARTMENT_OTHER): Payer: 59 | Admitting: Anesthesiology

## 2017-09-06 DIAGNOSIS — I10 Essential (primary) hypertension: Secondary | ICD-10-CM | POA: Insufficient documentation

## 2017-09-06 DIAGNOSIS — Z7901 Long term (current) use of anticoagulants: Secondary | ICD-10-CM | POA: Insufficient documentation

## 2017-09-06 DIAGNOSIS — F17210 Nicotine dependence, cigarettes, uncomplicated: Secondary | ICD-10-CM | POA: Insufficient documentation

## 2017-09-06 DIAGNOSIS — Z79899 Other long term (current) drug therapy: Secondary | ICD-10-CM | POA: Insufficient documentation

## 2017-09-06 DIAGNOSIS — C50911 Malignant neoplasm of unspecified site of right female breast: Secondary | ICD-10-CM | POA: Insufficient documentation

## 2017-09-06 DIAGNOSIS — I480 Paroxysmal atrial fibrillation: Secondary | ICD-10-CM | POA: Insufficient documentation

## 2017-09-06 HISTORY — PX: RE-EXCISION OF BREAST LUMPECTOMY: SHX6048

## 2017-09-06 SURGERY — EXCISION, LESION, BREAST
Anesthesia: General | Site: Breast | Laterality: Right

## 2017-09-06 MED ORDER — OXYCODONE HCL 5 MG/5ML PO SOLN: 5.0000 mg | Freq: Once | ORAL | Status: AC | PRN

## 2017-09-06 MED ORDER — CHLORHEXIDINE GLUCONATE CLOTH 2 % EX PADS: 6.0000 | MEDICATED_PAD | Freq: Once | CUTANEOUS | Status: DC

## 2017-09-06 MED ORDER — DEXAMETHASONE SODIUM PHOSPHATE 10 MG/ML IJ SOLN
INTRAMUSCULAR | Status: AC
  Filled 2017-09-06: qty 1

## 2017-09-06 MED ORDER — ONDANSETRON HCL 4 MG/2ML IJ SOLN
INTRAMUSCULAR | Status: AC
  Filled 2017-09-06: qty 2

## 2017-09-06 MED ORDER — LACTATED RINGERS IV SOLN
INTRAVENOUS | Status: DC
  Administered 2017-09-06 (×2): via INTRAVENOUS

## 2017-09-06 MED ORDER — HYDROMORPHONE HCL 1 MG/ML IJ SOLN
INTRAMUSCULAR | Status: AC
  Filled 2017-09-06: qty 0.5

## 2017-09-06 MED ORDER — DEXAMETHASONE SODIUM PHOSPHATE 4 MG/ML IJ SOLN
INTRAMUSCULAR | Status: DC | PRN
  Administered 2017-09-06: 8 mg via INTRAVENOUS

## 2017-09-06 MED ORDER — OXYCODONE HCL 5 MG PO TABS
5.0000 mg | ORAL_TABLET | Freq: Once | ORAL | Status: AC | PRN
  Administered 2017-09-06: 5 mg via ORAL

## 2017-09-06 MED ORDER — HYDROMORPHONE HCL 1 MG/ML IJ SOLN
0.2500 mg | INTRAMUSCULAR | Status: DC | PRN
  Administered 2017-09-06 (×2): 0.5 mg via INTRAVENOUS

## 2017-09-06 MED ORDER — FENTANYL CITRATE (PF) 100 MCG/2ML IJ SOLN: 50.0000 ug | INTRAMUSCULAR | Status: DC | PRN

## 2017-09-06 MED ORDER — BUPIVACAINE-EPINEPHRINE 0.25% -1:200000 IJ SOLN
INTRAMUSCULAR | Status: DC | PRN
  Administered 2017-09-06: 20 mL

## 2017-09-06 MED ORDER — FENTANYL CITRATE (PF) 100 MCG/2ML IJ SOLN
INTRAMUSCULAR | Status: AC
  Filled 2017-09-06: qty 2

## 2017-09-06 MED ORDER — PROMETHAZINE HCL 25 MG/ML IJ SOLN: 6.2500 mg | INTRAMUSCULAR | Status: DC | PRN

## 2017-09-06 MED ORDER — PROPOFOL 10 MG/ML IV BOLUS
INTRAVENOUS | Status: AC
  Filled 2017-09-06: qty 40

## 2017-09-06 MED ORDER — FENTANYL CITRATE (PF) 100 MCG/2ML IJ SOLN
INTRAMUSCULAR | Status: DC | PRN
  Administered 2017-09-06 (×3): 50 ug via INTRAVENOUS

## 2017-09-06 MED ORDER — LIDOCAINE 2% (20 MG/ML) 5 ML SYRINGE
INTRAMUSCULAR | Status: AC
  Filled 2017-09-06: qty 5

## 2017-09-06 MED ORDER — CEFAZOLIN SODIUM-DEXTROSE 2-4 GM/100ML-% IV SOLN
INTRAVENOUS | Status: AC
  Filled 2017-09-06: qty 100

## 2017-09-06 MED ORDER — BUPIVACAINE-EPINEPHRINE (PF) 0.25% -1:200000 IJ SOLN
INTRAMUSCULAR | Status: AC
  Filled 2017-09-06: qty 30

## 2017-09-06 MED ORDER — OXYCODONE HCL 5 MG PO TABS
ORAL_TABLET | ORAL | Status: AC
  Filled 2017-09-06: qty 1

## 2017-09-06 MED ORDER — MIDAZOLAM HCL 2 MG/2ML IJ SOLN
INTRAMUSCULAR | Status: DC | PRN
  Administered 2017-09-06: 1 mg via INTRAVENOUS

## 2017-09-06 MED ORDER — LIDOCAINE HCL (CARDIAC) 20 MG/ML IV SOLN
INTRAVENOUS | Status: DC | PRN
  Administered 2017-09-06: 50 mg via INTRAVENOUS

## 2017-09-06 MED ORDER — GLYCOPYRROLATE 0.2 MG/ML IJ SOLN
INTRAMUSCULAR | Status: DC | PRN
  Administered 2017-09-06: 0.2 mg via INTRAVENOUS

## 2017-09-06 MED ORDER — 0.9 % SODIUM CHLORIDE (POUR BTL) OPTIME
TOPICAL | Status: DC | PRN
  Administered 2017-09-06: 500 mL

## 2017-09-06 MED ORDER — EPHEDRINE SULFATE 50 MG/ML IJ SOLN
INTRAMUSCULAR | Status: DC | PRN
  Administered 2017-09-06: 5 mg via INTRAVENOUS
  Administered 2017-09-06: 10 mg via INTRAVENOUS

## 2017-09-06 MED ORDER — MIDAZOLAM HCL 2 MG/2ML IJ SOLN
INTRAMUSCULAR | Status: AC
  Filled 2017-09-06: qty 2

## 2017-09-06 MED ORDER — PROPOFOL 10 MG/ML IV BOLUS
INTRAVENOUS | Status: DC | PRN
Start: 2017-09-06 — End: 2017-09-06
  Administered 2017-09-06: 200 mg via INTRAVENOUS

## 2017-09-06 MED ORDER — MIDAZOLAM HCL 2 MG/2ML IJ SOLN: 1.0000 mg | INTRAMUSCULAR | Status: DC | PRN

## 2017-09-06 MED ORDER — SCOPOLAMINE 1 MG/3DAYS TD PT72: 1.0000 | MEDICATED_PATCH | Freq: Once | TRANSDERMAL | Status: DC | PRN

## 2017-09-06 MED ORDER — ONDANSETRON HCL 4 MG/2ML IJ SOLN
INTRAMUSCULAR | Status: DC | PRN
  Administered 2017-09-06: 4 mg via INTRAVENOUS

## 2017-09-06 MED ORDER — EPHEDRINE 5 MG/ML INJ
INTRAVENOUS | Status: AC
  Filled 2017-09-06: qty 10

## 2017-09-06 MED ORDER — DEXTROSE 5 % IV SOLN
3.0000 g | INTRAVENOUS | Status: AC
  Administered 2017-09-06: 2 g via INTRAVENOUS

## 2017-09-06 SURGICAL SUPPLY — 52 items
ADH SKN CLS APL DERMABOND .7 (GAUZE/BANDAGES/DRESSINGS) ×1
APPLIER CLIP 9.375 MED OPEN (MISCELLANEOUS)
APR CLP MED 9.3 20 MLT OPN (MISCELLANEOUS)
BINDER BREAST LRG (GAUZE/BANDAGES/DRESSINGS) IMPLANT
BINDER BREAST MEDIUM (GAUZE/BANDAGES/DRESSINGS) IMPLANT
BINDER BREAST XLRG (GAUZE/BANDAGES/DRESSINGS) IMPLANT
BINDER BREAST XXLRG (GAUZE/BANDAGES/DRESSINGS) IMPLANT
BLADE SURG 15 STRL LF DISP TIS (BLADE) ×1 IMPLANT
BLADE SURG 15 STRL SS (BLADE) ×2
CANISTER SUCT 1200ML W/VALVE (MISCELLANEOUS) ×2 IMPLANT
CHLORAPREP W/TINT 26ML (MISCELLANEOUS) ×2 IMPLANT
CLIP APPLIE 9.375 MED OPEN (MISCELLANEOUS) IMPLANT
COVER BACK TABLE 60X90IN (DRAPES) ×2 IMPLANT
COVER MAYO STAND STRL (DRAPES) ×2 IMPLANT
DECANTER SPIKE VIAL GLASS SM (MISCELLANEOUS) ×1 IMPLANT
DERMABOND ADVANCED (GAUZE/BANDAGES/DRESSINGS) ×1
DERMABOND ADVANCED .7 DNX12 (GAUZE/BANDAGES/DRESSINGS) ×1 IMPLANT
DEVICE DUBIN W/COMP PLATE 8390 (MISCELLANEOUS) IMPLANT
DRAPE LAPAROSCOPIC ABDOMINAL (DRAPES) IMPLANT
DRAPE LAPAROTOMY 100X72 PEDS (DRAPES) ×2 IMPLANT
DRAPE UTILITY XL STRL (DRAPES) ×2 IMPLANT
ELECT COATED BLADE 2.86 ST (ELECTRODE) ×2 IMPLANT
ELECT REM PT RETURN 9FT ADLT (ELECTROSURGICAL) ×2
ELECTRODE REM PT RTRN 9FT ADLT (ELECTROSURGICAL) ×1 IMPLANT
GLOVE BIOGEL PI IND STRL 7.0 (GLOVE) IMPLANT
GLOVE BIOGEL PI IND STRL 7.5 (GLOVE) IMPLANT
GLOVE BIOGEL PI IND STRL 8 (GLOVE) ×1 IMPLANT
GLOVE BIOGEL PI INDICATOR 7.0 (GLOVE) ×1
GLOVE BIOGEL PI INDICATOR 7.5 (GLOVE) ×1
GLOVE BIOGEL PI INDICATOR 8 (GLOVE) ×1
GLOVE ECLIPSE 8.0 STRL XLNG CF (GLOVE) ×2 IMPLANT
GLOVE SURG SS PI 6.5 STRL IVOR (GLOVE) ×1 IMPLANT
GOWN STRL REUS W/ TWL LRG LVL3 (GOWN DISPOSABLE) ×2 IMPLANT
GOWN STRL REUS W/TWL LRG LVL3 (GOWN DISPOSABLE) ×4
HEMOSTAT ARISTA ABSORB 3G PWDR (MISCELLANEOUS) IMPLANT
KIT MARKER MARGIN INK (KITS) ×1 IMPLANT
NDL HYPO 25X1 1.5 SAFETY (NEEDLE) ×1 IMPLANT
NEEDLE HYPO 25X1 1.5 SAFETY (NEEDLE) ×2 IMPLANT
NS IRRIG 1000ML POUR BTL (IV SOLUTION) ×2 IMPLANT
PACK BASIN DAY SURGERY FS (CUSTOM PROCEDURE TRAY) ×2 IMPLANT
PENCIL BUTTON HOLSTER BLD 10FT (ELECTRODE) ×2 IMPLANT
SLEEVE SCD COMPRESS KNEE MED (MISCELLANEOUS) ×2 IMPLANT
SPONGE LAP 4X18 X RAY DECT (DISPOSABLE) ×2 IMPLANT
STAPLER VISISTAT 35W (STAPLE) IMPLANT
SUT MON AB 4-0 PC3 18 (SUTURE) ×2 IMPLANT
SUT SILK 2 0 SH (SUTURE) IMPLANT
SUT VICRYL 3-0 CR8 SH (SUTURE) ×2 IMPLANT
SYR CONTROL 10ML LL (SYRINGE) ×2 IMPLANT
TOWEL OR 17X24 6PK STRL BLUE (TOWEL DISPOSABLE) ×3 IMPLANT
TOWEL OR NON WOVEN STRL DISP B (DISPOSABLE) ×2 IMPLANT
TUBE CONNECTING 20X1/4 (TUBING) ×2 IMPLANT
YANKAUER SUCT BULB TIP NO VENT (SUCTIONS) ×2 IMPLANT

## 2017-09-06 NOTE — H&P (Signed)
Caroline [name] is an 61 y.o. female.   Chief Complaint: right breast cancer  HPI: pt returns for re excision lumpectomy for breast cancer on the right   Past Medical History:  Diagnosis Date  . Arthritis    hips  . Hypertension   . Paroxysmal atrial fibrillation [facility])     Past Surgical History:  Procedure Laterality Date  . BREAST LUMPECTOMY WITH RADIOACTIVE SEED AND SENTINEL LYMPH NODE BIOPSY Right 08/17/2017   Procedure: RIGHT BREAST RADIOACTIVE SEED LOCALIZED LUMPECTOMY AND SENTINEL LYMPH NODE BIOPSY;  Surgeon: Erroll Luna, MD;  Location: [place];  Service: General;  Laterality: Right;  . CESAREAN SECTION  [phone]  . HEMORRHOID SURGERY     20 years ago  . TOE SURGERY Right    5th 20 years ago    Family History  Problem Relation Age of Onset  . Breast cancer Cousin    Social History:  reports that she has been smoking Cigarettes.  She has a 20.00 pack-year smoking history. She has never used smokeless tobacco. She reports that she drinks about 4.2 oz of alcohol per week . She reports that she does not use drugs.  Allergies:  Allergies  Allergen Reactions  . Sulfa Antibiotics Other (See Comments)    Unknown, was child    Medications Prior to Admission  Medication Sig Dispense Refill  . buPROPion (WELLBUTRIN XL) 150 MG 24 hr tablet Take 150 mg by mouth daily.    . fenofibrate 160 MG tablet Take 160 mg by mouth daily.    . metoprolol succinate (TOPROL-XL) 25 MG 24 hr tablet TAKE 1 TABLET DAILY 90 tablet 2  . valsartan-hydrochlorothiazide (DIOVAN-HCT) 160-12.5 MG tablet Take 1 tablet by mouth daily.    Marland Kitchen apixaban (ELIQUIS) 5 MG TABS tablet Take 1 tablet (5 mg total) by mouth 2 (two) times daily. 60 tablet 6  . HYDROcodone-acetaminophen (NORCO/VICODIN) 5-325 MG tablet Take 1 tablet by mouth every 6 (six) hours as needed for moderate pain.    Marland Kitchen ibuprofen (ADVIL,MOTRIN) 800 MG tablet Take 1 tablet (800 mg total) by mouth every 8 (eight) hours as  needed. 30 tablet 0  . oxyCODONE (OXY IR/ROXICODONE) 5 MG immediate release tablet Take 1-2 tablets (5-10 mg total) by mouth every 6 (six) hours as needed for severe pain. (Patient not taking: Reported on 08/30/2017) 15 tablet 0    Results for orders placed or performed during the hospital encounter of 09/06/17 (from the past 48 hour(s))  Basic metabolic panel     Status: Abnormal   Collection Time: 09/05/17  4:00 PM  Result Value Ref Range   Sodium 138 135 - 145 mmol/L   Potassium 5.7 (H) 3.5 - 5.1 mmol/L   Chloride 103 101 - 111 mmol/L   CO2 27 22 - 32 mmol/L   Glucose, Bld 96 65 - 99 mg/dL   BUN 40 (H) 6 - 20 mg/dL   Creatinine, Ser 1.51 (H) 0.44 - 1.00 mg/dL   Calcium 9.5 8.9 - 10.3 mg/dL   GFR calc non Af Amer 37 (L) >60 mL/min   GFR calc Af Amer 43 (L) >60 mL/min    Comment: (NOTE) The eGFR has been calculated using the CKD EPI equation. This calculation has not been validated in all clinical situations. eGFR's persistently <60 mL/min signify possible Chronic Kidney Disease.    Anion gap 8 5 - 15   No results found.  Review of Systems  Constitutional: Negative.   Respiratory: Negative.  Cardiovascular: Negative.   Skin: Negative.   Neurological: Negative.   Psychiatric/Behavioral: Negative.     Blood pressure (!) 97/59, pulse 67, temperature 98.4 F (36.9 C), temperature source Oral, resp. rate 18, height '5\' 4"'  (1.626 m), weight 74.8 kg (165 lb), last menstrual period 11/25/2010, SpO2 99 %. Physical Exam  Constitutional: She is oriented to person, place, and time.  Cardiovascular: Normal rate.   Respiratory: Effort normal.  right breast incision CDI   Neurological: She is alert and oriented to person, place, and time.  Skin: Skin is warm and dry.     Assessment/Plan RIGHT BREAST CANCER  RE EXCISION LUMPECTOMY TODAY Bump in  renal function  May need follow up for this by primary care   Fahd Galea A., MD 09/06/2017, 8:18 AM

## 2017-09-06 NOTE — Anesthesia Postprocedure Evaluation (Signed)
Anesthesia Post Note  Patient: Caroline [name]  Procedure(s) Performed: Procedure(s) (LRB): RE-EXCISION OF RIGHT BREAST LUMPECTOMY (Right)     Patient location during evaluation: PACU Anesthesia Type: General Level of consciousness: awake and alert Pain management: pain level controlled Vital Signs Assessment: post-procedure vital signs reviewed and stable Respiratory status: spontaneous breathing, nonlabored ventilation, respiratory function stable and patient connected to nasal cannula oxygen Cardiovascular status: blood pressure returned to baseline and stable Postop Assessment: no signs of nausea or vomiting Anesthetic complications: no    Last Vitals:  Vitals:   09/06/17 1045 09/06/17 1101  BP:  97/62  Pulse:  84  Resp:  17  Temp:  36.8 C  SpO2: 97% 97%    Last Pain:  Vitals:   09/06/17 1101  TempSrc: Oral  PainSc:                  [name] P Zyriah Mask

## 2017-09-06 NOTE — Interval H&P Note (Signed)
History and Physical Interval Note:  09/06/2017 8:24 AM  Caroline [name]  has presented today for surgery, with the diagnosis of right breast cancer  The various methods of treatment have been discussed with the patient and family. After consideration of risks, benefits and other options for treatment, the patient has consented to  Procedure(s): RE-EXCISION OF RIGHT BREAST LUMPECTOMY ERAS PATHWAY (Right) as a surgical intervention .  The patient's history has been reviewed, patient examined, no change in status, stable for surgery.  I have reviewed the patient's chart and labs.  Questions were answered to the patient's satisfaction.     Nobuko Gsell A.

## 2017-09-06 NOTE — Op Note (Signed)
Breast Re-excison Lumpectomy Procedure Note RIGHT  Indications:  This patient returns following an initial lumpectomy.  Analysis of the pathology specimen revealed microscopic involvement of the medial, lateral, posterior, anterior, inferior and superior margins.  The patient now returns for re-excision.  Pre-operative Diagnosis: right breast cancer  Post-operative Diagnosis: right breast cancer  Surgeon: Erroll Luna A.   Assistants: NONE   Anesthesia: General LMA anesthesia and Local anesthesia 0.25.% bupivacaine, with epinephrine  ASA Class: 2  Procedure Details  The patient was seen in the Holding Room. The risks, benefits, complications, treatment options, and expected outcomes were discussed with the patient. The possibilities of reaction to medication, pulmonary aspiration, bleeding, infection, the need for additional procedures, failure to diagnose a condition, and creating a complication requiring transfusion or operation were discussed with the patient. The patient concurred with the proposed plan, giving informed consent.  The site of surgery properly noted/marked. The patient was taken to Operating Room # 8, identified as Caroline [name] and the procedure verified as Breast Re-excision Lumpectomy. A Time Out was held and the above information confirmed.  the patient was placed supine.  The breast was prepped and draped in standard fashion. Marcaine 0.25% with epinephrine was used to anesthetize the skin around the previous lumpectomy incision.  The incision was opened.  A  seroma was evacuated.  Additional local anesthesia was delivered medial, lateral, posterior, anterior, inferior and superiorly within the lumpectomy cavity.  A full thickness re-excision was performed.  .  The new margins were  inked and the specimens were  submitted to pathology.  Hemostasis was achieved with cautery.  Closure was performed in 2 layers with a 3-0 Vicryl  And 4 0 monocryl subcuticular closure.     Dermabond was  applied.  At the end of the operation, all sponge, instrument and needle counts were correct.   Findings: grossly clear surgical margins  Estimated Blood Loss:  less than 50 mL                  Total IV Fluids: per anesthesia          Specimens: see above                  Complications:  None; patient tolerated the procedure well.         Disposition: PACU - hemodynamically stable.         Condition: stable  Attending Attestation: I performed the procedure.

## 2017-09-06 NOTE — Discharge Instructions (Signed)
[facility] °Office Phone Number [phone] ° °BREAST BIOPSY/ PARTIAL MASTECTOMY: POST OP INSTRUCTIONS ° °Always review your discharge instruction sheet given to you by the facility where your surgery was performed. ° °IF YOU HAVE DISABILITY OR FAMILY LEAVE FORMS, YOU MUST BRING THEM TO THE OFFICE FOR PROCESSING.  DO NOT GIVE THEM TO YOUR DOCTOR. ° °1. A prescription for pain medication may be given to you upon discharge.  Take your pain medication as prescribed, if needed.  If narcotic pain medicine is not needed, then you may take acetaminophen (Tylenol) or ibuprofen (Advil) as needed. °2. Take your usually prescribed medications unless otherwise directed °3. If you need a refill on your pain medication, please contact your pharmacy.  They will contact our office to request authorization.  Prescriptions will not be filled after 5pm or on week-ends. °4. You should eat very light the first 24 hours after surgery, such as soup, crackers, pudding, etc.  Resume your normal diet the day after surgery. °5. Most patients will experience some swelling and bruising in the breast.  Ice packs and a good support bra will help.  Swelling and bruising can take several days to resolve.  °6. It is common to experience some constipation if taking pain medication after surgery.  Increasing fluid intake and taking a stool softener will usually help or prevent this problem from occurring.  A mild laxative (Milk of Magnesia or Miralax) should be taken according to package directions if there are no bowel movements after 48 hours. °7. Unless discharge instructions indicate otherwise, you may remove your bandages 24-48 hours after surgery, and you may shower at that time.  You may have steri-strips (small skin tapes) in place directly over the incision.  These strips should be left on the skin for 7-10 days.  If your surgeon used skin glue on the incision, you may shower in 24 hours.  The glue will flake off over the  next 2-3 weeks.  Any sutures or staples will be removed at the office during your follow-up visit. °8. ACTIVITIES:  You may resume regular daily activities (gradually increasing) beginning the next day.  Wearing a good support bra or sports bra minimizes pain and swelling.  You may have sexual intercourse when it is comfortable. °a. You may drive when you no longer are taking prescription pain medication, you can comfortably wear a seatbelt, and you can safely maneuver your car and apply brakes. °b. RETURN TO WORK:  ______________________________________________________________________________________ °9. You should see your doctor in the office for a follow-up appointment approximately two weeks after your surgery.  Your doctor’s nurse will typically make your follow-up appointment when she calls you with your pathology report.  Expect your pathology report 2-3 business days after your surgery.  You may call to check if you do not hear from us after three days. °10. OTHER INSTRUCTIONS: _______________________________________________________________________________________________ _____________________________________________________________________________________________________________________________________ °_____________________________________________________________________________________________________________________________________ °_____________________________________________________________________________________________________________________________________ ° °WHEN TO CALL YOUR DOCTOR: °1. Fever over 101.0 °2. Nausea and/or vomiting. °3. Extreme swelling or bruising. °4. Continued bleeding from incision. °5. Increased pain, redness, or drainage from the incision. ° °The clinic staff is available to answer your questions during regular business hours.  Please don’t hesitate to call and ask to speak to one of the nurses for clinical concerns.  If you have a medical emergency, go to the nearest  emergency room or call 911.  A surgeon from [facility] is always on call at the hospital. ° °For further questions, please visit [email or web address]  ° ° °  Restart blood thinner in 24 hours       Post Anesthesia Home Care Instructions  Activity: Get plenty of rest for the remainder of the day. A responsible individual must stay with you for 24 hours following the procedure.  For the next 24 hours, DO NOT: -Drive a car -Paediatric nurse -Drink alcoholic beverages -Take any medication unless instructed by your physician -Make any legal decisions or sign important papers.  Meals: Start with liquid foods such as gelatin or soup. Progress to regular foods as tolerated. Avoid greasy, spicy, heavy foods. If nausea and/or vomiting occur, drink only clear liquids until the nausea and/or vomiting subsides. Call your physician if vomiting continues.  Special Instructions/Symptoms: Your throat may feel dry or sore from the anesthesia or the breathing tube placed in your throat during surgery. If this causes discomfort, gargle with warm salt water. The discomfort should disappear within 24 hours.  If you had a scopolamine patch placed behind your ear for the management of post- operative nausea and/or vomiting:  1. The medication in the patch is effective for 72 hours, after which it should be removed.  Wrap patch in a tissue and discard in the trash. Wash hands thoroughly with soap and water. 2. You may remove the patch earlier than 72 hours if you experience unpleasant side effects which may include dry mouth, dizziness or visual disturbances. 3. Avoid touching the patch. Wash your hands with soap and water after contact with the patch.

## 2017-09-06 NOTE — Transfer of Care (Signed)
Immediate Anesthesia Transfer of Care Note  Patient: Caroline [name]  Procedure(s) Performed: Procedure(s): RE-EXCISION OF RIGHT BREAST LUMPECTOMY (Right)  Patient Location: PACU  Anesthesia Type:General  Level of Consciousness: awake, alert  and oriented  Airway & Oxygen Therapy: Patient Spontanous Breathing and Patient connected to face mask oxygen  Post-op Assessment: Report given to RN and Post -op Vital signs reviewed and stable  Post vital signs: Reviewed and stable  Last Vitals:  Vitals:   09/06/17 0753  BP: (!) 97/59  Pulse: 67  Resp: 18  Temp: 36.9 C  SpO2: 99%    Last Pain:  Vitals:   09/06/17 0753  TempSrc: Oral  PainSc: 5          Complications: No apparent anesthesia complications

## 2017-09-06 NOTE — Anesthesia Preprocedure Evaluation (Signed)
Anesthesia Evaluation  Patient identified by MRN, date of birth, ID band Patient awake    Reviewed: Allergy & Precautions, NPO status , Patient's Chart, lab work & pertinent test results, reviewed documented beta blocker date and time   Airway Mallampati: III  TM Distance: <3 FB Neck ROM: Full    Dental  (+) Teeth Intact, Dental Advisory Given   Pulmonary Current Smoker,    Pulmonary exam normal breath sounds clear to auscultation       Cardiovascular hypertension, Pt. on medications and Pt. on home beta blockers Normal cardiovascular exam+ dysrhythmias Atrial Fibrillation  Rhythm:Regular Rate:Normal     Neuro/Psych negative neurological ROS  negative psych ROS   GI/Hepatic negative GI ROS, Neg liver ROS,   Endo/Other  negative endocrine ROS  Renal/GU negative Renal ROS     Musculoskeletal  (+) Arthritis , Osteoarthritis,    Abdominal   Peds  Hematology  (+) Blood dyscrasia (Eliquis), ,   Anesthesia Other Findings Day of surgery medications reviewed with the patient.  Reproductive/Obstetrics                             Anesthesia Physical  Anesthesia Plan  ASA: II  Anesthesia Plan: General   Post-op Pain Management:    Induction: Intravenous  PONV Risk Score and Plan: 2 and Ondansetron and Dexamethasone  Airway Management Planned: LMA  Additional Equipment:   Intra-op Plan:   Post-operative Plan: Extubation in OR  Informed Consent: I have reviewed the patients History and Physical, chart, labs and discussed the procedure including the risks, benefits and alternatives for the proposed anesthesia with the patient or authorized representative who has indicated his/her understanding and acceptance.   Dental advisory given  Plan Discussed with: CRNA  Anesthesia Plan Comments:         Anesthesia Quick Evaluation

## 2017-09-06 NOTE — Anesthesia Procedure Notes (Signed)
Procedure Name: LMA Insertion Date/Time: 09/06/2017 8:44 AM Performed by: Rayvon Char Pre-anesthesia Checklist: Patient identified, Emergency Drugs available, Suction available and Patient being monitored Patient Re-evaluated:Patient Re-evaluated prior to induction Oxygen Delivery Method: Circle system utilized Preoxygenation: Pre-oxygenation with 100% oxygen Induction Type: IV induction Ventilation: Mask ventilation without difficulty LMA: LMA inserted LMA Size: 4.0 Number of attempts: 1 Dental Injury: Teeth and Oropharynx as per pre-operative assessment

## 2017-09-07 ENCOUNTER — Encounter (HOSPITAL_BASED_OUTPATIENT_CLINIC_OR_DEPARTMENT_OTHER): Payer: Self-pay | Admitting: Surgery

## 2017-09-11 ENCOUNTER — Encounter: Payer: Self-pay | Admitting: Oncology

## 2017-09-11 ENCOUNTER — Telehealth: Payer: Self-pay | Admitting: *Deleted

## 2017-09-11 NOTE — Telephone Encounter (Signed)
Received mammaprint results of LOW RISK. Physician team notified. Left vm for pt to return call to discuss results and next steps. Contact information provided.

## 2017-09-12 NOTE — Progress Notes (Signed)
Location of Breast Cancer: Right Breast  Histology per Pathology Report:  08/02/17 Diagnosis 1. 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 - LYMPHOVASCULAR INVASION PRESENT. 2. Breast, right, needle core biopsy, 8:30 o'clock; 5CMFN - INVASIVE DUCTAL CARCINOMA.  Receptor Status: ER(90%), PR (20%), Her2-neu (NEG), Ki-(15%)  08/17/17 Diagnosis 1. Breast, lumpectomy, Right - INVASIVE AND IN SITU DUCTAL CARCINOMA, MULTIFOCAL, 1.5 AND 0.7 CM. - INVASIVE CARCINOMA BROADLY AT MEDIAL MARGIN, FOCALLY AT LATERAL MARGIN AND FOCALLY LESS THAN 0.1 CM FROM POSTERIOR MARGIN. - LYMPHOVASCULAR SPACE INVOLVEMENT BY TUMOR. - PREVIOUSLY BIOPSY SITES AND CLIPS. 2. Lymph node, sentinel, biopsy - METASTATIC CARCINOMA IN ONE LYMPH NODE (1/1).  09/06/17 Diagnosis 1. Breast, excision, Right re-excision medial margin - EXCISION SITE CHANGES. - NO MALIGNANCY IDENTIFIED. 2. Breast, excision, Right lateral margin - EXCISION SITE CHANGES. - NO MALIGNANCY IDENTIFIED. 3. Breast, excision, Right superior margin - EXCISION SITE CHANGES. - NO MALIGNANCY IDENTIFIED. 4. Breast, excision, Right posterior margin - EXCISION SITE CHANGES. - NO MALIGNANCY IDENTIFIED. 5. Breast, excision, Right anterior margin - EXCISION SITE CHANGES. - NO MALIGNANCY IDENTIFIED. 6. Breast, excision, Right inferior margin - EXCISION SITE CHANGES. - NO MALIGNANCY IDENTIFIED.   Did patient present with symptoms or was this found on screening mammography?: It was found on a screening mammogram.   Past/Anticipated interventions by surgeon, if any: 08/17/17 Procedure: right breast seed localized lumpectomy with deep left axillary sentinel lymph node mapping  Surgeon: Erroll Luna M.D.    09/06/17 Breast Re-excison Lumpectomy Procedure Note RIGHT Dr. Brantley Stage   Past/Anticipated interventions by medical oncology, if any:  08/31/17 Dr.  Jana Hakim.    Per note 09/11/17, mammaprint results showed low risk.   Lymphedema issues, if any:  She denies. She has good arm mobility.   Pain issues, if any:  She denies breast pain. She does have pain to her bilateral hips due to a recent diagnosis of arthritis.   SAFETY ISSUES:  Prior radiation? No  Pacemaker/ICD? No  Possible current pregnancy? No  Is the patient on methotrexate? No  Current Complaints / other details: She reports having some pain in her incision and when to see the PA at the surgeon's office on Wednesday.  She is taking Amoxicillin.  She will see Dr. Brantley Stage today.  BP 102/67 (BP Location: Left Arm, Patient Position: Sitting)   Pulse 76   Temp 98.9 F (37.2 C) (Oral)   Ht '5\' 4"'  (1.626 m)   Wt 165 lb 6.4 oz (75 kg)   LMP 11/25/2010   SpO2 98%   BMI 28.39 kg/m    Wt Readings from Last 3 Encounters:  09/15/17 165 lb 6.4 oz (75 kg)  09/06/17 165 lb (74.8 kg)  08/31/17 165 lb 12.8 oz (75.2 kg)

## 2017-09-15 ENCOUNTER — Ambulatory Visit
Admission: RE | Admit: 2017-09-15 | Discharge: 2017-09-15 | Disposition: A | Discharge status: Home / Self Care | Payer: 59 | Source: Ambulatory Visit | Attending: Radiation Oncology | Admitting: Radiation Oncology

## 2017-09-15 ENCOUNTER — Encounter: Payer: Self-pay | Admitting: Radiation Oncology

## 2017-09-15 DIAGNOSIS — Z170 Estrogen receptor positive status [ER+]: Secondary | ICD-10-CM

## 2017-09-15 DIAGNOSIS — C50511 Malignant neoplasm of lower-outer quadrant of right female breast: Secondary | ICD-10-CM

## 2017-09-15 DIAGNOSIS — Z510 Encounter for antineoplastic radiation therapy: Secondary | ICD-10-CM | POA: Diagnosis not present

## 2017-09-15 NOTE — Progress Notes (Addendum)
Radiation Oncology         [phone] ________________________________  Name: Caroline [name] MRN: [id number]  Date: 09/15/2017  DOB: 22-Feb-1956  Follow-Up Visit Note  Outpatient  CC: Epimenio Foot, MD  Diagnosis:      ICD-10-CM   1. 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     Since consultation, she underwent re-excision of the right breast by Dr. Brantley Stage for her positive margins on 09/06/2017. The re-excision specimen showed no malignancy.  Dr. Jana Hakim ordered Mammaprint testing on 08/31/2017. Per [employer] note on 09/11/2017, results came back low risk, indicating no need for chemotherapy.   The patient saw the PA at Dr. Josetta Huddle office two days ago for throbbing pain to the incision site that she reports was tender. She says the skin to the area was pink and warm to the touch. The PA ordered Amoxicillin for this to treat possible infection to the area. The patient reports the Amoxicillin has helped relieve her symptoms. She has a follow-up appointment with Dr. Brantley Stage today at 11:00 AM. She is hoping she will not need to undergo aspiration.   The patient presents today to discuss adjuvant radiotherapy. Her review of systems is notable for that in the above paragraph. In addition, she does have pain to her bilateral hips due to a recent diagnosis of arthritis. She tells me she recently received a Cortisone shot to her left hip. She reports that this is bothering her more so than "anything with her breast." She says she will need hip surgery in December. She also reports that she has cut down her smoking by half with the support of  her husband and children.            ALLERGIES:  is allergic to sulfa antibiotics.  Meds: Current Outpatient Prescriptions  Medication Sig Dispense Refill  . amoxicillin-clavulanate (AUGMENTIN) 250-125 MG tablet Take 1 tablet by mouth 2 (two) times daily.    Marland Kitchen apixaban (ELIQUIS) 5 MG TABS tablet Take 1 tablet (5 mg total) by mouth 2 (two) times daily. 60 tablet 6  . buPROPion (WELLBUTRIN XL) 150 MG 24 hr tablet Take 150 mg by mouth daily.    . fenofibrate 160 MG tablet Take 160 mg by mouth daily.    Marland Kitchen HYDROcodone-acetaminophen (NORCO/VICODIN) 5-325 MG tablet Take 1 tablet by mouth every 6 (six) hours as needed for moderate pain.    . metoprolol succinate (TOPROL-XL) 25 MG 24 hr tablet TAKE 1 TABLET DAILY 90 tablet 2  . valsartan-hydrochlorothiazide (DIOVAN-HCT) 160-12.5 MG tablet Take 1 tablet by mouth daily.    Marland Kitchen ibuprofen (ADVIL,MOTRIN) 800 MG tablet Take 1 tablet (800 mg total) by mouth every 8 (eight) hours as needed. (Patient not taking: Reported on 09/15/2017) 30 tablet 0  . oxyCODONE (OXY IR/ROXICODONE) 5 MG immediate release tablet Take 1-2 tablets (5-10 mg total) by mouth every 6 (six) hours as needed for severe pain. (Patient not taking: Reported on 08/30/2017) 15 tablet 0   No current facility-administered medications for this encounter.     Physical Findings:  height is _0  (1.626 m) and weight is 165 lb 6.4 oz (  75 kg). Her oral temperature is 98.9 F (37.2 C). Her blood pressure is 102/67 and her pulse is 76. Her oxygen saturation is 98%. .     General: Alert and oriented, in no acute distress. HEENT: Head is normocephalic. Neck: Neck is supple, no palpable cervical or supraclavicular lymphadenopathy. Extremities: No cyanosis or edema in the upper extremities.  Lymphatics: see Neck Exam Musculoskeletal: Symmetric strength and muscle tone throughout. She has good ROM in her right shoulder.  Neurologic: No obvious focalities. Speech is fluent.  Psychiatric: Judgment and  insight are intact. Affect is appropriate.  Breast exam reveals no significant erythema or warmth to the re-incision site. Her right lumpectomy and axillary scars are healing well. There is edema in the lower outer quadrant of the right breast consistent with seroma.   Lab Findings: Lab Results  Component Value Date   WBC 6.5 08/31/2017   HGB 11.4 (L) 08/31/2017   HCT 34.1 (L) 08/31/2017   MCV 99.4 08/31/2017   PLT 250 08/31/2017      Radiographic Findings: Mm Breast Surgical Specimen  Result Date: 08/17/2017 CLINICAL DATA:  Two radioactive seeds were placed in the right breast on 08/14/2017 prior to surgery. The patient has a recent diagnosis of right breast cancer. EXAM: SPECIMEN RADIOGRAPH OF THE RIGHT BREAST COMPARISON:  Previous exam(s). FINDINGS: Status post excision of the right breast. Two radioactive seeds, a ribbon shaped biopsy marker clip, and a coil shaped biopsy marker clip are present, completely intact, and were marked for pathology. IMPRESSION: Specimen radiograph of the right breast. Electronically Signed   By: Curlene Dolphin M.D.   On: 08/17/2017 09:54    Impression/Plan: Right Breast Cancer s/p Lumpectomy, SLN biopsy and Re-Excision We discussed adjuvant radiotherapy today.  I recommend to the right breast and regional nodes in order to reduce risk of locoregional recurrence by 2/3.  The risks, benefits and side effects of this treatment were discussed in detail.  She understands that radiotherapy is associated with skin irritation and fatigue in the acute setting. Late effects can include cosmetic changes and rare injury to internal organs.   She is enthusiastic about proceeding with treatment. A consent form was signed and placed in her chart at our initial consultation on 08/30/2017. I will put in the order for her CT simulation/planning appointment to take place in 2.5-3 weeks with 6 weeks of treatment to begin the following week. She will be called to schedule these  appointments.   A total of 5 medically necessary complex treatment devices will be fabricated and supervised by me: 4 fields with MLCs for custom blocks to protect heart, and lungs;  and, a Vac-lok. MORE COMPLEX DEVICES MAY BE MADE IN DOSIMETRY FOR FIELD IN FIELD BEAMS FOR DOSE HOMOGENEITY.  I have requested : 3D Simulation which is medically necessary to give adequate dose to at risk tissues while sparing lungs and heart.  I have requested a DVH of the following structures: lungs, heart, right breast lumpectomy cavity(s).    The patient will receive 50 Gy in 25 fractions to the right breast and regional nodes with 4 fields.  This will be followed by a boost.  I advised the patient to continue smoking reduction and ultimate cessation in order to improve her prognosis and tolerance of treatment.   I spent 20 minutes face to face with the patient and more than 50% of that time was spent in counseling and/or coordination of care. _____________________________________   Eppie Gibson, MD  This document  serves as a record of services personally performed by Eppie Gibson, MD. It was created on her behalf by Rae Lips, a trained medical scribe. The creation of this record is based on the scribe's personal observations and the provider's statements to them. This document has been checked and approved by the attending provider.

## 2017-09-15 NOTE — Progress Notes (Signed)
Please see the Nurse Progress Note in the MD Initial Consult Encounter for this patient. 

## 2017-09-18 ENCOUNTER — Encounter (HOSPITAL_COMMUNITY): Payer: Self-pay

## 2017-09-18 ENCOUNTER — Other Ambulatory Visit: Payer: 59

## 2017-09-18 ENCOUNTER — Encounter: Payer: 59 | Admitting: Genetic Counselor

## 2017-10-02 ENCOUNTER — Ambulatory Visit
Admission: RE | Admit: 2017-10-02 | Discharge: 2017-10-02 | Disposition: A | Discharge status: Home / Self Care | Payer: 59 | Source: Ambulatory Visit | Attending: Radiation Oncology | Admitting: Radiation Oncology

## 2017-10-02 DIAGNOSIS — C50511 Malignant neoplasm of lower-outer quadrant of right female breast: Secondary | ICD-10-CM

## 2017-10-02 DIAGNOSIS — Z510 Encounter for antineoplastic radiation therapy: Secondary | ICD-10-CM | POA: Diagnosis not present

## 2017-10-02 DIAGNOSIS — Z170 Estrogen receptor positive status [ER+]: Secondary | ICD-10-CM

## 2017-10-03 NOTE — Progress Notes (Signed)
  Radiation Oncology         [phone] ________________________________  Name: CEANNA WAREING MRN: [id number]  Date: 10/02/2017  DOB: 08/24/1956  SIMULATION AND TREATMENT PLANNING NOTE    Outpatient  DIAGNOSIS:     ICD-10-CM   1. 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  Identity was confirmed.  All relevant records and images related to the planned course of therapy were reviewed.  The patient freely provided informed written consent to proceed with treatment after reviewing the details related to the planned course of therapy. The consent form was witnessed and verified by the simulation staff.    Then, the patient was set-up in a stable reproducible supine position for radiation therapy with her ipsilateral arm over her head, and her upper body secured in a custom-made Vac-lok device.  CT images were obtained.  Surface markings were placed.  The CT images were loaded into the planning software.    TREATMENT PLANNING NOTE: Treatment planning then occurred.  The radiation prescription was entered and confirmed.     A total of 5 medically necessary complex treatment devices were fabricated and supervised by me: 4 fields with MLCs for custom blocks to protect heart, and lungs;  and, a Vac-lok. MORE COMPLEX DEVICES MAY BE MADE IN DOSIMETRY FOR FIELD IN FIELD BEAMS FOR DOSE HOMOGENEITY.  I have requested : 3D Simulation which is medically necessary to give adequate dose to at risk tissues while sparing lungs and heart.  I have requested a DVH of the following structures: lungs, heart, .    The patient will receive 50 Gy in 25 fractions to the right breast and regional nodes with 4 fields. This will  be followed by a right breast boost.  Optical Surface Tracking Plan:  Since intensity modulated radiotherapy (IMRT) and 3D conformal radiation  treatment methods are predicated on accurate and precise positioning for treatment, intrafraction motion monitoring is medically necessary to ensure accurate and safe treatment delivery. The ability to quantify intrafraction motion without excessive ionizing radiation dose can only be performed with optical surface tracking. Accordingly, surface imaging offers the opportunity to obtain 3D measurements of patient position throughout IMRT and 3D treatments without excessive radiation exposure. I am ordering optical surface tracking for this patient's upcoming course of radiotherapy.  ________________________________   Reference:  Ursula Alert, J, et al. Surface imaging-based analysis of intrafraction motion for breast radiotherapy patients.Journal of [place], n. 6, nov. 2014. ISSN [id number].  Available at: <http://www.jacmp.org/index.php/jacmp/article/view/4957>.    -----------------------------------  Eppie Gibson, MD

## 2017-10-03 NOTE — Progress Notes (Signed)
[place]  [phone]) [phone] [phone]     ID: YADIRA HADA DOB: 09/20/1956  MR#: [id number]  LNL#:[id number]  Patient Care Team: Selinda Orion as PCP - General (Physician Assistant) Magrinat, Virgie Dad, MD as Consulting Physician (Oncology) Erroll Luna, MD as Consulting Physician (General Surgery) Leo Grosser, Seymour Bars, MD as Consulting Physician (Obstetrics and Gynecology) Donzetta Sprung., MD as Physician Assistant (Sports Medicine) Eppie Gibson, MD as Attending Physician (Radiation Oncology) Juanita Craver, MD as Consulting Physician (Gastroenterology) Thompson Grayer, MD as Consulting Physician (Cardiology) Mcarthur Rossetti, MD as Consulting Physician (Orthopedic Surgery) Chauncey Cruel, MD OTHER MD:  CHIEF COMPLAINT: Estrogen receptor positive breast cancer  CURRENT TREATMENT: Awaiting definitive surgery   HISTORY OF CURRENT ILLNESS: From the original intake note:  Offie (last name is pronounced muh-REE--kuh) had routine screening mammography with tomography at the [facility] 07/21/2017, showing the breast density to be category C. An area of possible distortion was noted in the right breast, and on 07/28/2017 she underwent right diagnostic mammography with tomography and right breast ultrasonography. This confirmed an area of distortion in the outer right breast which by exam showed acid focal thickening at the 8:30 position, 4 cm from the nipple. Ultrasound showed 3 separate irregular hypoechoic areas in this region, measuring 0.9, 0.7 and 0.5 cm. Taken together these 2 areas at up to 2.1 cm in greatest diameter. There was no abnormal right axillary adenopathy.  2 of these masses were biopsied 08/02/2017. The final pathology ([id number]) showed both to consist of invasive ductal carcinoma, grade 2 with the prognostic panel (from the lesion at 8:30 o'clock 4 cm from the nipple) was estrogen receptor 90% positive, progesterone  receptor 20% positive, both with strong staining intensity, with an MIB-1 of 15%, and no HER-2 complication, the signals ratio being 1.00 and the number per cell 1.05.  The patient proceeded to right lumpectomy and axillary sentinel lymph node sampling 08/17/2017, with the final pathology ([id number]) showing 2 areas of invasive ductal carcinoma, grade 2, measuring 1.5 and 0.7 cm. Margins were broadly positive medially and focally laterally. The single sentinel lymph node was involved by tumor.  The patient's subsequent history is as detailed below.  INTERVAL HISTORY: Inna returns today for follow-up and treatment of her estrogen receptor positive breast cancer. Since her last visit here she had additional surgery for margin clearance. This was accomplished 09/06/2017 ([id number]).  Also since the last visit we obtained a Mammaprint results from her lumpectomy specimen. This showed a luminal type a low risk tumor with a low risk of recurrence and predicting a 2-3% chance of distant disease recurrence at 5 years (97.8% disease-free) with anti-estrogens only.  Accordingly she may safely forego chemotherapy. She has already met with radiation oncology and will be receiving radiation between October 16 and November 28.  REVIEW OF SYSTEMS: Jaquelynn had some additional pain of course with a second surgery, but this was well tolerated and she is currently on no pain medicine including no nonsteroidals. Recall she is on apixaban. She is not having any bleeding complications. She is having significant hip pain however and she is planning to see Dr. Rush Farmer for consideration of at least 1 hip replacement this year, with another one next year. Aside from these issues a detailed review of systems today was stable  PAST MEDICAL HISTORY: Past Medical History:  Diagnosis Date  . Arthritis    hips  . Hypertension   . Paroxysmal atrial fibrillation (HCC)  PAST SURGICAL HISTORY: Past Surgical History:    Procedure Laterality Date  . BREAST LUMPECTOMY WITH RADIOACTIVE SEED AND SENTINEL LYMPH NODE BIOPSY Right 08/17/2017   Procedure: RIGHT BREAST RADIOACTIVE SEED LOCALIZED LUMPECTOMY AND SENTINEL LYMPH NODE BIOPSY;  Surgeon: Erroll Luna, MD;  Location: [place];  Service: General;  Laterality: Right;  . CESAREAN SECTION  [phone]  . HEMORRHOID SURGERY     20 years ago  . RE-EXCISION OF BREAST LUMPECTOMY Right 09/06/2017   Procedure: RE-EXCISION OF RIGHT BREAST LUMPECTOMY;  Surgeon: Erroll Luna, MD;  Location: [place];  Service: General;  Laterality: Right;  . TOE SURGERY Right    5th 20 years ago    FAMILY HISTORY Family History  Problem Relation Age of Onset  . Breast cancer Cousin   The patient's father died from dementia at the age of 57. The patient's mother died from emphysema at the age of 86. The patient has 2 brothers, no sisters. On the paternal side a first cousin was diagnosed with breast cancer at the age of 21. There is no other breast ovarian or prostate cancer in the family but the patient is aware it is a small family. The patient's brother living in [place] had a melanoma diagnosed in his 58s  GYNECOLOGIC HISTORY:  Patient's last menstrual period was 11/25/2010. Menarche age 3, first live birth age 15, she is [place] P3. The middle child was stillborn. She stopped having periods in her late 44s. She did not take hormone replacement. She took oral contraceptives for approximately 15 years remotely with no complications.  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 Daughter Benjie Karvonen is currently living at home. She graduated from [employer] in exercise and sports signs. Son Ellard Artis is currently attending  [employer]. The patient is not a church attender.    ADVANCED DIRECTIVES:    HEALTH MAINTENANCE: Social History  Substance Use Topics   . Smoking status: Current Every Day Smoker    Packs/day: 0.50    Years: 40.00    Types: Cigarettes  . Smokeless tobacco: Never Used  . Alcohol use 4.2 oz/week    7 Glasses of wine per week     Comment: wine nightly     Colonoscopy:/ [facility]  PAP:  Bone density: remote   Allergies  Allergen Reactions  . Sulfa Antibiotics Other (See Comments)    Unknown, was child    Current Outpatient Prescriptions  Medication Sig Dispense Refill  . amoxicillin-clavulanate (AUGMENTIN) 250-125 MG tablet Take 1 tablet by mouth 2 (two) times daily.    Marland Kitchen apixaban (ELIQUIS) 5 MG TABS tablet Take 1 tablet (5 mg total) by mouth 2 (two) times daily. 60 tablet 6  . buPROPion (WELLBUTRIN XL) 150 MG 24 hr tablet Take 150 mg by mouth daily.    . fenofibrate 160 MG tablet Take 160 mg by mouth daily.    Marland Kitchen HYDROcodone-acetaminophen (NORCO/VICODIN) 5-325 MG tablet Take 1 tablet by mouth every 6 (six) hours as needed for moderate pain.    . metoprolol succinate (TOPROL-XL) 25 MG 24 hr tablet TAKE 1 TABLET DAILY 90 tablet 2  . tamoxifen (NOLVADEX) 20 MG tablet Take 1 tablet (20 mg total) by mouth daily. 90 tablet 12  . valsartan-hydrochlorothiazide (DIOVAN-HCT) 160-12.5 MG tablet Take 1 tablet by mouth daily.     No current facility-administered medications for this visit.     OBJECTIVE:Middle-aged  white woman In no acute distress  Vitals:   10/04/17 1634  BP: 134/74  Pulse: 95  Resp: 20  Temp: 98.4 F (36.9 C)  SpO2: 100%     Body mass index is 28.84 kg/m.   Wt Readings from Last 3 Encounters:  10/04/17 168 lb (76.2 kg)  09/15/17 165 lb 6.4 oz (75 kg)  09/06/17 165 lb (74.8 kg)      ECOG FS:2 - Symptomatic, <50% confined to bed  Sclerae unicteric, EOMs intact Oropharynx clear and moist No cervical or supraclavicular adenopathy Lungs no rales or rhonchi Heart regular rate and rhythm Abd soft, nontender, positive bowel sounds MSK no focal spinal tenderness, no upper extremity lymphedema Neuro:  nonfocal, well oriented, appropriate affect Breasts: The right breast is status post lumpectomy and sentinel lymph node sampling. Despite having undergone a second surgery the cosmetic result remains excellent. The left breast is benign. Both axillae are benign.  LAB RESULTS:  CMP     Component Value Date/Time   NA 138 09/05/2017 1600   NA 140 08/31/2017 1616   K 5.7 (H) 09/05/2017 1600   K 3.9 08/31/2017 1616   CL 103 09/05/2017 1600   CO2 27 09/05/2017 1600   CO2 27 08/31/2017 1616   GLUCOSE 96 09/05/2017 1600   GLUCOSE 139 08/31/2017 1616   BUN 40 (H) 09/05/2017 1600   BUN 45.6 (H) 08/31/2017 1616   CREATININE 1.51 (H) 09/05/2017 1600   CREATININE 1.6 (H) 08/31/2017 1616   CALCIUM 9.5 09/05/2017 1600   CALCIUM 10.0 08/31/2017 1616   PROT 7.2 08/31/2017 1616   ALBUMIN 3.5 08/31/2017 1616   AST 12 08/31/2017 1616   ALT 14 08/31/2017 1616   ALKPHOS 76 08/31/2017 1616   BILITOT 0.25 08/31/2017 1616   GFRNONAA 37 (L) 09/05/2017 1600   GFRAA 43 (L) 09/05/2017 1600    No results found for: TOTALPROTELP, ALBUMINELP, A1GS, A2GS, BETS, BETA2SER, GAMS, MSPIKE, SPEI  No results found for: KPAFRELGTCHN, LAMBDASER, KAPLAMBRATIO  Lab Results  Component Value Date   WBC 6.5 08/31/2017   NEUTROABS 3.7 08/31/2017   HGB 11.4 (L) 08/31/2017   HCT 34.1 (L) 08/31/2017   MCV 99.4 08/31/2017   PLT 250 08/31/2017      Chemistry      Component Value Date/Time   NA 138 09/05/2017 1600   NA 140 08/31/2017 1616   K 5.7 (H) 09/05/2017 1600   K 3.9 08/31/2017 1616   CL 103 09/05/2017 1600   CO2 27 09/05/2017 1600   CO2 27 08/31/2017 1616   BUN 40 (H) 09/05/2017 1600   BUN 45.6 (H) 08/31/2017 1616   CREATININE 1.51 (H) 09/05/2017 1600   CREATININE 1.6 (H) 08/31/2017 1616      Component Value Date/Time   CALCIUM 9.5 09/05/2017 1600   CALCIUM 10.0 08/31/2017 1616   ALKPHOS 76 08/31/2017 1616   AST 12 08/31/2017 1616   ALT 14 08/31/2017 1616   BILITOT 0.25 08/31/2017 1616        No results found for: LABCA2  No components found for: EHOZYY482  No results for input(s): INR in the last 168 hours.  No results found for: LABCA2  No results found for: NOI370  No results found for: WUG891  No results found for: QXI503  No results found for: CA2729  No components found for: HGQUANT  No results found for: CEA1 / No results found for: CEA1   No results found for: AFPTUMOR  No results found for: [facility]  No results found for: PSA1  No visits with results within 3 Day(s) from this visit.  Latest known visit with results is:  Admission on 09/06/2017, Discharged on 09/06/2017  Component Date Value Ref Range Status  . Sodium 09/05/2017 138  135 - 145 mmol/L Final  . Potassium 09/05/2017 5.7* 3.5 - 5.1 mmol/L Final  . Chloride 09/05/2017 103  101 - 111 mmol/L Final  . CO2 09/05/2017 27  22 - 32 mmol/L Final  . Glucose, Bld 09/05/2017 96  65 - 99 mg/dL Final  . BUN 09/05/2017 40* 6 - 20 mg/dL Final  . Creatinine, Ser 09/05/2017 1.51* 0.44 - 1.00 mg/dL Final  . Calcium 09/05/2017 9.5  8.9 - 10.3 mg/dL Final  . GFR calc non Af Amer 09/05/2017 37* >60 mL/min Final  . GFR calc Af Amer 09/05/2017 43* >60 mL/min Final   Comment: (NOTE) The eGFR has been calculated using the CKD EPI equation. This calculation has not been validated in all clinical situations. eGFR's persistently <60 mL/min signify possible Chronic Kidney Disease.   . Anion gap 09/05/2017 8  5 - 15 Final    (this displays the last labs from the last 3 days)  No results found for: TOTALPROTELP, ALBUMINELP, A1GS, A2GS, BETS, BETA2SER, GAMS, MSPIKE, SPEI (this displays SPEP labs)  No results found for: KPAFRELGTCHN, LAMBDASER, KAPLAMBRATIO (kappa/lambda light chains)  No results found for: HGBA, HGBA2QUANT, HGBFQUANT, HGBSQUAN (Hemoglobinopathy evaluation)   No results found for: LDH  No results found for: IRON, TIBC, IRONPCTSAT (Iron and TIBC)  No results found for:  FERRITIN  Urinalysis No results found for: COLORURINE, APPEARANCEUR, LABSPEC, PHURINE, GLUCOSEU, HGBUR, BILIRUBINUR, KETONESUR, PROTEINUR, UROBILINOGEN, NITRITE, LEUKOCYTESUR   STUDIES: Pathology and Mammaprint report discussed in detail with the patient  ELIGIBLE FOR AVAILABLE RESEARCH PROTOCOL: no  ASSESSMENT: 61 y.o. [place], [place] woman status post right breast upper outer quadrant biopsy 08/02/2017 for a clinical T1c No invasive ductal carcinoma, grade 2, estrogen and progesterone receptor positive, HER-2 not amplified, with an MIB-1 of 15%.  (1) status post right lumpectomy and axillary sentinel lymph node sampling 08/17/2017 for an mpT1c pN1, stage IB invasive ductal carcinoma, grade 2, with positive margins  (2) additional surgery 09/06/2017 successfully cleared margins.  (3) Mammaprint report shows "low-risk [facility]'s "tumor, predicting a good prognosis without chemotherapy and no significant benefit from chemotherapy  (4) adjuvant planned, to be completed 11/22/2017  (5) tamoxifen started 10/04/2017  (6) genetics testing pending  PLAN: Simona Huh has done very well with her surgery, and has an excellent prognosis overall. She will be receiving radiation for the next 6+ weeks. After that she is planning to have hip surgery  The question is when to start anti-estrogens. Normally I like the patient to have completed local treatment, but in her case she is going to go straight from radiation to surgery and it might be 3 months before she is "in his stable situation). She is uncomfortable waiting this long I would prefer to start now. I think that is reasonable and we discussed the difference between tamoxifen and anastrozole in detail. She understands that anastrozole and the aromatase inhibitors in general work by blocking estrogen production. Accordingly vaginal dryness, decrease in bone density, and of course hot flashes can result. The aromatase inhibitors can also  negatively affect the cholesterol profile, although that is a minor effect. One out of 5 women on aromatase inhibitors we will feel "old and achy". This arthralgia/myalgia syndrome, which resembles fibromyalgia clinically, does resolve with stopping  the medications. Accordingly this is not a reason to not try an aromatase inhibitor but it is a frequent reason to stop it (in other words 20% of women will not be able to tolerate these medications).  Tamoxifen on the other hand does not block estrogen production. It does not "take away a woman's estrogen". It blocks the estrogen receptor in breast cells. Like anastrozole, it can also cause hot flashes. As opposed to anastrozole, tamoxifen has many estrogen-like effects. It is technically an estrogen receptor modulator. This means that in some tissues tamoxifen works like estrogen-- for example it helps strengthen the bones. It tends to improve the cholesterol profile. It can cause thickening of the endometrial lining, and even endometrial polyps or rarely cancer of the uterus.(The risk of uterine cancer due to tamoxifen is one additional cancer per thousand women year). It can cause vaginal wetness or stickiness. It can cause blood clots through this estrogen-like effect--the risk of blood clots with tamoxifen is exactly the same as with birth control pills or hormone replacement.  Neither of these agents causes mood changes or weight gain, despite the popular belief that they can have these side effects. We have data from studies comparing either of these drugs with placebo, and in those cases the control group had the same amount of weight gain and depression as the group that took the drug.  She is very clearly drawn towards tamoxifen because of its help in terms of the bones and the fact that it would allow her to use vaginal estrogen if necessary for vaginal dryness issues. She took oral contraceptives remotely for many years with no clotting complications  and of course she is currently on a blood thinner. Accordingly we are going to start tamoxifen now and since she will be in the building daily for the next 6 weeks or so if she has any complications she will simply stop by my office and discuss it. Otherwise I plan to see her again in 3 months.  There is no clear consensus regarding stopping tamoxifen before surgeries but I suggested stopping at 10 days before her planned hip surgery, once that is scheduled. I expect her anticoagulation will be continued as long as possible before the surgery and resumed as soon as possible afterwards to do not anticipate significant issues there.  She knows to call for any problems that may develop before her next visit here.    Magrinat, Virgie Dad, MD  10/04/17 5:04 PM Medical Oncology and Hematology [facility] [address] [place], [place] [address] Tel. [phone]    Fax. [phone]   This document serves as a record of services personally performed by Lurline Del, MD. It was created on her behalf by Steva Colder, a trained medical scribe. The creation of this record is based on the scribe's personal observations and the provider's statements to them. This document has been checked and approved by the attending provider.

## 2017-10-04 ENCOUNTER — Ambulatory Visit (HOSPITAL_BASED_OUTPATIENT_CLINIC_OR_DEPARTMENT_OTHER): Payer: 59 | Admitting: Oncology

## 2017-10-04 VITALS — BP 134/74 | HR 95 | Temp 98.4°F | Resp 20 | Ht 64.0 in | Wt 168.0 lb

## 2017-10-04 DIAGNOSIS — C50411 Malignant neoplasm of upper-outer quadrant of right female breast: Secondary | ICD-10-CM

## 2017-10-04 DIAGNOSIS — Z79810 Long term (current) use of selective estrogen receptor modulators (SERMs): Secondary | ICD-10-CM | POA: Diagnosis not present

## 2017-10-04 DIAGNOSIS — C50511 Malignant neoplasm of lower-outer quadrant of right female breast: Secondary | ICD-10-CM

## 2017-10-04 DIAGNOSIS — Z170 Estrogen receptor positive status [ER+]: Secondary | ICD-10-CM

## 2017-10-04 MED ORDER — TAMOXIFEN CITRATE 20 MG PO TABS: 20.0000 mg | ORAL_TABLET | Freq: Every day | ORAL | 12 refills | Status: DC

## 2017-10-05 ENCOUNTER — Ambulatory Visit (INDEPENDENT_AMBULATORY_CARE_PROVIDER_SITE_OTHER): Payer: 59

## 2017-10-05 ENCOUNTER — Ambulatory Visit (INDEPENDENT_AMBULATORY_CARE_PROVIDER_SITE_OTHER): Payer: 59 | Admitting: Orthopaedic Surgery

## 2017-10-05 DIAGNOSIS — M1612 Unilateral primary osteoarthritis, left hip: Secondary | ICD-10-CM | POA: Diagnosis not present

## 2017-10-05 DIAGNOSIS — M25552 Pain in left hip: Secondary | ICD-10-CM

## 2017-10-05 MED ORDER — HYDROCODONE-ACETAMINOPHEN 5-325 MG PO TABS: 1.0000 | ORAL_TABLET | Freq: Two times a day (BID) | ORAL | 0 refills | Status: DC | PRN

## 2017-10-05 NOTE — Progress Notes (Signed)
Office Visit Note   Patient: Caroline [name]           Date of Birth: 12-07-1956           MRN: [id number] Visit Date: 10/05/2017              Requested by: Aura Dials, PA-C [place], [place] [address] PCP: Aura Dials, PA-C   Assessment & Plan: Visit Diagnoses:  1. Pain in left hip   2. Unilateral primary osteoarthritis, left hip     Plan: I agree with her need for hip replacement surgery. She has tried all conservative treatment measures and her pain is still severe. The injection only helped for short amount of time. Her MRI findings and plain film findings also show the need for hip replacement surgery of her left hip. I showed her hip model and went over her x-rays. In detail with the surgery involves including a thorough discussion of the risk and benefits of surgery and what her intraoperative and postoperative course with involved. All questions and concerns were answered and addressed. We will wait to schedule her surgery for when she is done with radiation therapy in December.  Follow-Up Instructions: Return for 2 weeks post-op.   Orders:  Orders Placed This Encounter  Procedures  . XR HIP UNILAT W OR W/O PELVIS 1V LEFT   Meds ordered this encounter  Medications  . HYDROcodone-acetaminophen (NORCO/VICODIN) 5-325 MG tablet    Sig: Take 1-2 tablets by mouth 2 (two) times daily as needed for moderate pain.    Dispense:  60 tablet    Refill:  0      Procedures: No procedures performed   Clinical Data: No additional findings.   Subjective: No chief complaint on file. The patient is a very pleasant 61 year old female who comes for evaluation for a hip replacement surgery. She has severe left hip pain and it is daily. It is 10 out of 10. It is detrimentally affected her activities daily living, her quality of life, her mobility. She did see a hip specialist at [facility] with orthopedic surgery who  recommended hip replacement. She rather have the surgery hear in [place] and he referred her to me. She is tried and failed all forms conservative treatment. Her pain is been going on for only about a year or less. It is severe. She's had intra-articular aspiration and injection. She's also had MRI of her hip to confirm that hip disease. She does have a history of breast cancer and a soon undergoing 5 weeks of radiation treatment. She is also on the blood thinner Eliquis.  HPI  Review of Systems She currently denies any headache, chest pain, shortness of breath, fever, chills, nausea, vomiting.  Objective: Vital Signs: LMP 11/25/2010   Physical Exam She is alert or 3 and in no acute distress Ortho Exam Examination of both hip show severe pain with attempts of internal and external rotation. There is no blocks to rotation but her pain is quite severe. Her leg lengths are equal. Specialty Comments:  No specialty comments available.  Imaging: Xr Hip Unilat W Or W/o Pelvis 1v Left  Result Date: 10/05/2017 An AP pelvis and a lateral of her left hip shows joint space narrowing with para-articular osteophytes. There sclerotic and cystic changes in the acetabulum as well as the femoral head.  I did review the MRI report from her left hip and it does show arthritic changes in  her hip. There is cystic changes and stress reaction subchondral in the femoral head and acetabulum.  PMFS History: Patient Active Problem List   Diagnosis Date Noted  . Pain in left hip 10/05/2017  . Unilateral primary osteoarthritis, left hip 10/05/2017  . Carcinoma of lower-outer quadrant of right breast in female, estrogen receptor positive ([place]) 08/30/2017  . Hyperlipidemia 01/03/2013  . Chest pain 11/26/2011  . Tobacco abuse 11/26/2011  . HTN (hypertension) 11/26/2011   Past Medical History:  Diagnosis Date  . Arthritis    hips  . Hypertension   . Paroxysmal atrial fibrillation (HCC)     Family  History  Problem Relation Age of Onset  . Breast cancer Cousin     Past Surgical History:  Procedure Laterality Date  . BREAST LUMPECTOMY WITH RADIOACTIVE SEED AND SENTINEL LYMPH NODE BIOPSY Right 08/17/2017   Procedure: RIGHT BREAST RADIOACTIVE SEED LOCALIZED LUMPECTOMY AND SENTINEL LYMPH NODE BIOPSY;  Surgeon: Erroll Luna, MD;  Location: [place];  Service: General;  Laterality: Right;  . CESAREAN SECTION  [phone]  . HEMORRHOID SURGERY     20 years ago  . RE-EXCISION OF BREAST LUMPECTOMY Right 09/06/2017   Procedure: RE-EXCISION OF RIGHT BREAST LUMPECTOMY;  Surgeon: Erroll Luna, MD;  Location: [place];  Service: General;  Laterality: Right;  . TOE SURGERY Right    5th 20 years ago   Social History   Occupational History  . Not on file.   Social History Main Topics  . Smoking status: Current Every Day Smoker    Packs/day: 0.50    Years: 40.00    Types: Cigarettes  . Smokeless tobacco: Never Used  . Alcohol use 4.2 oz/week    7 Glasses of wine per week     Comment: wine nightly  . Drug use: No  . Sexual activity: Yes    Birth control/ protection: Post-menopausal

## 2017-10-06 DIAGNOSIS — Z510 Encounter for antineoplastic radiation therapy: Secondary | ICD-10-CM | POA: Diagnosis not present

## 2017-10-09 ENCOUNTER — Ambulatory Visit: Payer: 59 | Admitting: Radiation Oncology

## 2017-10-10 ENCOUNTER — Ambulatory Visit
Admission: RE | Admit: 2017-10-10 | Discharge: 2017-10-10 | Disposition: A | Discharge status: Home / Self Care | Payer: 59 | Source: Ambulatory Visit | Attending: Radiation Oncology | Admitting: Radiation Oncology

## 2017-10-10 DIAGNOSIS — Z510 Encounter for antineoplastic radiation therapy: Secondary | ICD-10-CM | POA: Diagnosis not present

## 2017-10-11 ENCOUNTER — Telehealth: Payer: Self-pay | Admitting: Oncology

## 2017-10-11 ENCOUNTER — Ambulatory Visit
Admission: RE | Admit: 2017-10-11 | Discharge: 2017-10-11 | Disposition: A | Discharge status: Home / Self Care | Payer: 59 | Source: Ambulatory Visit | Attending: Radiation Oncology | Admitting: Radiation Oncology

## 2017-10-11 DIAGNOSIS — Z510 Encounter for antineoplastic radiation therapy: Secondary | ICD-10-CM | POA: Diagnosis not present

## 2017-10-11 NOTE — Telephone Encounter (Signed)
Spoke with patient regarding appt that was added per 10/10 los.

## 2017-10-12 ENCOUNTER — Ambulatory Visit
Admission: RE | Admit: 2017-10-12 | Discharge: 2017-10-12 | Disposition: A | Discharge status: Home / Self Care | Payer: 59 | Source: Ambulatory Visit | Attending: Radiation Oncology | Admitting: Radiation Oncology

## 2017-10-12 DIAGNOSIS — Z510 Encounter for antineoplastic radiation therapy: Secondary | ICD-10-CM | POA: Diagnosis not present

## 2017-10-13 ENCOUNTER — Ambulatory Visit
Admission: RE | Admit: 2017-10-13 | Discharge: 2017-10-13 | Disposition: A | Discharge status: Home / Self Care | Payer: 59 | Source: Ambulatory Visit | Attending: Radiation Oncology | Admitting: Radiation Oncology

## 2017-10-13 DIAGNOSIS — Z510 Encounter for antineoplastic radiation therapy: Secondary | ICD-10-CM | POA: Diagnosis not present

## 2017-10-16 ENCOUNTER — Ambulatory Visit
Admission: RE | Admit: 2017-10-16 | Discharge: 2017-10-16 | Disposition: A | Discharge status: Home / Self Care | Payer: 59 | Source: Ambulatory Visit | Attending: Radiation Oncology | Admitting: Radiation Oncology

## 2017-10-16 DIAGNOSIS — Z510 Encounter for antineoplastic radiation therapy: Secondary | ICD-10-CM | POA: Diagnosis not present

## 2017-10-16 DIAGNOSIS — C50511 Malignant neoplasm of lower-outer quadrant of right female breast: Secondary | ICD-10-CM

## 2017-10-16 DIAGNOSIS — Z170 Estrogen receptor positive status [ER+]: Secondary | ICD-10-CM

## 2017-10-16 MED ORDER — ALRA NON-METALLIC DEODORANT (RAD-ONC)
1.0000 "application " | Freq: Once | TOPICAL | Status: AC
  Administered 2017-10-16: 1 via TOPICAL

## 2017-10-16 MED ORDER — RADIAPLEXRX EX GEL
Freq: Once | CUTANEOUS | Status: AC
  Administered 2017-10-16: 17:00:00 via TOPICAL

## 2017-10-16 NOTE — Progress Notes (Signed)

## 2017-10-17 ENCOUNTER — Ambulatory Visit
Admission: RE | Admit: 2017-10-17 | Discharge: 2017-10-17 | Disposition: A | Discharge status: Home / Self Care | Payer: 59 | Source: Ambulatory Visit | Attending: Radiation Oncology | Admitting: Radiation Oncology

## 2017-10-17 DIAGNOSIS — Z510 Encounter for antineoplastic radiation therapy: Secondary | ICD-10-CM | POA: Diagnosis not present

## 2017-10-18 ENCOUNTER — Ambulatory Visit
Admission: RE | Admit: 2017-10-18 | Discharge: 2017-10-18 | Disposition: A | Discharge status: Home / Self Care | Payer: 59 | Source: Ambulatory Visit | Attending: Radiation Oncology | Admitting: Radiation Oncology

## 2017-10-18 ENCOUNTER — Other Ambulatory Visit (HOSPITAL_COMMUNITY): Payer: Self-pay | Admitting: *Deleted

## 2017-10-18 DIAGNOSIS — Z510 Encounter for antineoplastic radiation therapy: Secondary | ICD-10-CM | POA: Diagnosis not present

## 2017-10-18 MED ORDER — APIXABAN 5 MG PO TABS: 5.0000 mg | ORAL_TABLET | Freq: Two times a day (BID) | ORAL | 0 refills | Status: DC

## 2017-10-19 ENCOUNTER — Ambulatory Visit
Admission: RE | Admit: 2017-10-19 | Discharge: 2017-10-19 | Disposition: A | Discharge status: Home / Self Care | Payer: 59 | Source: Ambulatory Visit | Attending: Radiation Oncology | Admitting: Radiation Oncology

## 2017-10-19 DIAGNOSIS — Z510 Encounter for antineoplastic radiation therapy: Secondary | ICD-10-CM | POA: Diagnosis not present

## 2017-10-20 ENCOUNTER — Ambulatory Visit
Admission: RE | Admit: 2017-10-20 | Discharge: 2017-10-20 | Disposition: A | Discharge status: Home / Self Care | Payer: 59 | Source: Ambulatory Visit | Attending: Radiation Oncology | Admitting: Radiation Oncology

## 2017-10-20 DIAGNOSIS — Z510 Encounter for antineoplastic radiation therapy: Secondary | ICD-10-CM | POA: Diagnosis not present

## 2017-10-23 ENCOUNTER — Ambulatory Visit
Admission: RE | Admit: 2017-10-23 | Discharge: 2017-10-23 | Disposition: A | Discharge status: Home / Self Care | Payer: 59 | Source: Ambulatory Visit | Attending: Radiation Oncology | Admitting: Radiation Oncology

## 2017-10-23 ENCOUNTER — Other Ambulatory Visit: Payer: Self-pay | Admitting: *Deleted

## 2017-10-23 DIAGNOSIS — Z510 Encounter for antineoplastic radiation therapy: Secondary | ICD-10-CM | POA: Diagnosis not present

## 2017-10-24 ENCOUNTER — Ambulatory Visit
Admission: RE | Admit: 2017-10-24 | Discharge: 2017-10-24 | Disposition: A | Discharge status: Home / Self Care | Payer: 59 | Source: Ambulatory Visit | Attending: Radiation Oncology | Admitting: Radiation Oncology

## 2017-10-24 DIAGNOSIS — Z510 Encounter for antineoplastic radiation therapy: Secondary | ICD-10-CM | POA: Diagnosis not present

## 2017-10-25 ENCOUNTER — Ambulatory Visit
Admission: RE | Admit: 2017-10-25 | Discharge: 2017-10-25 | Disposition: A | Discharge status: Home / Self Care | Payer: 59 | Source: Ambulatory Visit | Attending: Radiation Oncology | Admitting: Radiation Oncology

## 2017-10-25 DIAGNOSIS — Z510 Encounter for antineoplastic radiation therapy: Secondary | ICD-10-CM | POA: Diagnosis not present

## 2017-10-26 ENCOUNTER — Ambulatory Visit
Admission: RE | Admit: 2017-10-26 | Discharge: 2017-10-26 | Disposition: A | Discharge status: Home / Self Care | Payer: 59 | Source: Ambulatory Visit | Attending: Radiation Oncology | Admitting: Radiation Oncology

## 2017-10-26 DIAGNOSIS — Z510 Encounter for antineoplastic radiation therapy: Secondary | ICD-10-CM | POA: Diagnosis not present

## 2017-10-27 ENCOUNTER — Ambulatory Visit
Admission: RE | Admit: 2017-10-27 | Discharge: 2017-10-27 | Disposition: A | Discharge status: Home / Self Care | Payer: 59 | Source: Ambulatory Visit | Attending: Radiation Oncology | Admitting: Radiation Oncology

## 2017-10-27 DIAGNOSIS — Z510 Encounter for antineoplastic radiation therapy: Secondary | ICD-10-CM | POA: Diagnosis not present

## 2017-10-30 ENCOUNTER — Other Ambulatory Visit (INDEPENDENT_AMBULATORY_CARE_PROVIDER_SITE_OTHER): Payer: Self-pay | Admitting: Orthopaedic Surgery

## 2017-10-30 ENCOUNTER — Telehealth (INDEPENDENT_AMBULATORY_CARE_PROVIDER_SITE_OTHER): Payer: Self-pay | Admitting: Orthopaedic Surgery

## 2017-10-30 ENCOUNTER — Ambulatory Visit: Payer: 59

## 2017-10-30 MED ORDER — HYDROCODONE-ACETAMINOPHEN 5-325 MG PO TABS
1.0000 | ORAL_TABLET | Freq: Two times a day (BID) | ORAL | 0 refills | Status: DC | PRN
Start: 2017-10-30 — End: 2017-11-24

## 2017-10-30 NOTE — Telephone Encounter (Signed)
Patient aware Rx ready at front desk  

## 2017-10-30 NOTE — Telephone Encounter (Signed)
She can come by and pick up a script

## 2017-10-30 NOTE — Telephone Encounter (Signed)
Patient called needing Rx refilled on her pain medicine. Patient was not sure of the name. The number to contact patient is [phone]

## 2017-10-30 NOTE — Telephone Encounter (Signed)
Please advise 

## 2017-10-31 ENCOUNTER — Ambulatory Visit
Admission: RE | Admit: 2017-10-31 | Discharge: 2017-10-31 | Disposition: A | Discharge status: Home / Self Care | Payer: 59 | Source: Ambulatory Visit | Attending: Radiation Oncology | Admitting: Radiation Oncology

## 2017-10-31 DIAGNOSIS — Z510 Encounter for antineoplastic radiation therapy: Secondary | ICD-10-CM | POA: Diagnosis not present

## 2017-11-01 ENCOUNTER — Ambulatory Visit
Admission: RE | Admit: 2017-11-01 | Discharge: 2017-11-01 | Disposition: A | Discharge status: Home / Self Care | Payer: 59 | Source: Ambulatory Visit | Attending: Radiation Oncology | Admitting: Radiation Oncology

## 2017-11-01 DIAGNOSIS — Z510 Encounter for antineoplastic radiation therapy: Secondary | ICD-10-CM | POA: Diagnosis not present

## 2017-11-02 ENCOUNTER — Ambulatory Visit
Admission: RE | Admit: 2017-11-02 | Discharge: 2017-11-02 | Disposition: A | Discharge status: Home / Self Care | Payer: 59 | Source: Ambulatory Visit | Attending: Radiation Oncology | Admitting: Radiation Oncology

## 2017-11-02 DIAGNOSIS — Z510 Encounter for antineoplastic radiation therapy: Secondary | ICD-10-CM | POA: Diagnosis not present

## 2017-11-03 ENCOUNTER — Other Ambulatory Visit (HOSPITAL_COMMUNITY): Payer: Self-pay | Admitting: Nurse Practitioner

## 2017-11-03 ENCOUNTER — Other Ambulatory Visit: Payer: Self-pay | Admitting: *Deleted

## 2017-11-03 ENCOUNTER — Ambulatory Visit
Admission: RE | Admit: 2017-11-03 | Discharge: 2017-11-03 | Disposition: A | Discharge status: Home / Self Care | Payer: 59 | Source: Ambulatory Visit | Attending: Radiation Oncology | Admitting: Radiation Oncology

## 2017-11-03 DIAGNOSIS — Z510 Encounter for antineoplastic radiation therapy: Secondary | ICD-10-CM | POA: Diagnosis not present

## 2017-11-03 MED ORDER — TAMOXIFEN CITRATE 20 MG PO TABS: 20.0000 mg | ORAL_TABLET | Freq: Every day | ORAL | 12 refills | Status: AC

## 2017-11-03 MED ORDER — TAMOXIFEN CITRATE 20 MG PO TABS: 20.0000 mg | ORAL_TABLET | Freq: Every day | ORAL | 1 refills | Status: DC

## 2017-11-06 ENCOUNTER — Ambulatory Visit
Admission: RE | Admit: 2017-11-06 | Discharge: 2017-11-06 | Disposition: A | Discharge status: Home / Self Care | Payer: 59 | Source: Ambulatory Visit | Attending: Radiation Oncology | Admitting: Radiation Oncology

## 2017-11-06 DIAGNOSIS — Z510 Encounter for antineoplastic radiation therapy: Secondary | ICD-10-CM | POA: Diagnosis not present

## 2017-11-07 ENCOUNTER — Ambulatory Visit
Admission: RE | Admit: 2017-11-07 | Discharge: 2017-11-07 | Disposition: A | Discharge status: Home / Self Care | Payer: 59 | Source: Ambulatory Visit | Attending: Radiation Oncology | Admitting: Radiation Oncology

## 2017-11-07 DIAGNOSIS — Z510 Encounter for antineoplastic radiation therapy: Secondary | ICD-10-CM | POA: Diagnosis not present

## 2017-11-08 ENCOUNTER — Ambulatory Visit
Admission: RE | Admit: 2017-11-08 | Discharge: 2017-11-08 | Disposition: A | Discharge status: Home / Self Care | Payer: 59 | Source: Ambulatory Visit | Attending: Radiation Oncology | Admitting: Radiation Oncology

## 2017-11-08 DIAGNOSIS — Z510 Encounter for antineoplastic radiation therapy: Secondary | ICD-10-CM | POA: Diagnosis not present

## 2017-11-09 ENCOUNTER — Ambulatory Visit
Admission: RE | Admit: 2017-11-09 | Discharge: 2017-11-09 | Disposition: A | Discharge status: Home / Self Care | Payer: 59 | Source: Ambulatory Visit | Attending: Radiation Oncology | Admitting: Radiation Oncology

## 2017-11-09 DIAGNOSIS — Z510 Encounter for antineoplastic radiation therapy: Secondary | ICD-10-CM | POA: Diagnosis not present

## 2017-11-10 ENCOUNTER — Ambulatory Visit
Admission: RE | Admit: 2017-11-10 | Discharge: 2017-11-10 | Disposition: A | Discharge status: Home / Self Care | Payer: 59 | Source: Ambulatory Visit | Attending: Radiation Oncology | Admitting: Radiation Oncology

## 2017-11-10 DIAGNOSIS — Z510 Encounter for antineoplastic radiation therapy: Secondary | ICD-10-CM | POA: Diagnosis not present

## 2017-11-12 ENCOUNTER — Telehealth: Payer: Self-pay | Admitting: *Deleted

## 2017-11-12 ENCOUNTER — Ambulatory Visit
Admission: RE | Admit: 2017-11-12 | Discharge: 2017-11-12 | Disposition: A | Discharge status: Home / Self Care | Payer: 59 | Source: Ambulatory Visit | Attending: Radiation Oncology | Admitting: Radiation Oncology

## 2017-11-12 DIAGNOSIS — Z510 Encounter for antineoplastic radiation therapy: Secondary | ICD-10-CM | POA: Diagnosis not present

## 2017-11-12 NOTE — Telephone Encounter (Signed)
error 

## 2017-11-13 ENCOUNTER — Ambulatory Visit
Admission: RE | Admit: 2017-11-13 | Discharge: 2017-11-13 | Disposition: A | Discharge status: Home / Self Care | Payer: 59 | Source: Ambulatory Visit | Attending: Radiation Oncology | Admitting: Radiation Oncology

## 2017-11-13 ENCOUNTER — Ambulatory Visit: Payer: 59

## 2017-11-13 DIAGNOSIS — Z510 Encounter for antineoplastic radiation therapy: Secondary | ICD-10-CM | POA: Diagnosis not present

## 2017-11-14 ENCOUNTER — Ambulatory Visit
Admission: RE | Admit: 2017-11-14 | Discharge: 2017-11-14 | Disposition: A | Discharge status: Home / Self Care | Payer: 59 | Source: Ambulatory Visit | Attending: Radiation Oncology | Admitting: Radiation Oncology

## 2017-11-14 ENCOUNTER — Ambulatory Visit: Payer: 59

## 2017-11-14 DIAGNOSIS — Z510 Encounter for antineoplastic radiation therapy: Secondary | ICD-10-CM | POA: Diagnosis not present

## 2017-11-15 ENCOUNTER — Ambulatory Visit
Admission: RE | Admit: 2017-11-15 | Discharge: 2017-11-15 | Disposition: A | Discharge status: Home / Self Care | Payer: 59 | Source: Ambulatory Visit | Attending: Radiation Oncology | Admitting: Radiation Oncology

## 2017-11-15 ENCOUNTER — Ambulatory Visit: Payer: 59

## 2017-11-15 ENCOUNTER — Other Ambulatory Visit (INDEPENDENT_AMBULATORY_CARE_PROVIDER_SITE_OTHER): Payer: Self-pay | Admitting: Physician Assistant

## 2017-11-15 DIAGNOSIS — Z510 Encounter for antineoplastic radiation therapy: Secondary | ICD-10-CM | POA: Diagnosis not present

## 2017-11-20 ENCOUNTER — Ambulatory Visit: Payer: 59

## 2017-11-20 ENCOUNTER — Ambulatory Visit
Admission: RE | Admit: 2017-11-20 | Discharge: 2017-11-20 | Disposition: A | Discharge status: Home / Self Care | Payer: 59 | Source: Ambulatory Visit | Attending: Radiation Oncology | Admitting: Radiation Oncology

## 2017-11-20 DIAGNOSIS — Z510 Encounter for antineoplastic radiation therapy: Secondary | ICD-10-CM | POA: Diagnosis not present

## 2017-11-21 ENCOUNTER — Telehealth (INDEPENDENT_AMBULATORY_CARE_PROVIDER_SITE_OTHER): Payer: Self-pay | Admitting: Orthopaedic Surgery

## 2017-11-21 ENCOUNTER — Other Ambulatory Visit: Payer: Self-pay | Admitting: Radiation Oncology

## 2017-11-21 ENCOUNTER — Ambulatory Visit: Payer: 59

## 2017-11-21 ENCOUNTER — Ambulatory Visit
Admission: RE | Admit: 2017-11-21 | Discharge: 2017-11-21 | Disposition: A | Discharge status: Home / Self Care | Payer: 59 | Source: Ambulatory Visit | Attending: Radiation Oncology | Admitting: Radiation Oncology

## 2017-11-21 DIAGNOSIS — Z510 Encounter for antineoplastic radiation therapy: Secondary | ICD-10-CM | POA: Diagnosis not present

## 2017-11-21 NOTE — Telephone Encounter (Signed)
Please advise 

## 2017-11-21 NOTE — Telephone Encounter (Signed)
Patient called needing Rx refilled (Hydrocodone) The number to contact patient is [phone]

## 2017-11-22 ENCOUNTER — Encounter (HOSPITAL_COMMUNITY): Payer: Self-pay

## 2017-11-22 ENCOUNTER — Ambulatory Visit: Payer: 59

## 2017-11-22 ENCOUNTER — Ambulatory Visit
Admission: RE | Admit: 2017-11-22 | Discharge: 2017-11-22 | Disposition: A | Discharge status: Home / Self Care | Payer: 59 | Source: Ambulatory Visit | Attending: Radiation Oncology | Admitting: Radiation Oncology

## 2017-11-22 DIAGNOSIS — Z510 Encounter for antineoplastic radiation therapy: Secondary | ICD-10-CM | POA: Diagnosis not present

## 2017-11-22 NOTE — Patient Instructions (Addendum)
Caroline [name]  11/22/2017   Your procedure is scheduled on: Friday, Dec. 7, 2018   Report to [facility] Main  Entrance     Report to admitting at 6:45 AM   Call this number if you have problems the morning of surgery [phone]    Do not eat food or drink liquids :After Midnight.    Take these medicines the morning of surgery with A SIP OF WATER: Bupropion, Fenofibrate, Tamoxifen              You may not have any metal on your body including hair pins,  jewelry, and body piercings             Do not wear make-up, lotions, powders  perfumes, or deodorant             Do not wear nail polish.  Do not shave  48 hours prior to surgery.                 Do not bring valuables to the hospital. [name].   Contacts, dentures or bridgework may not be worn into surgery.   Leave suitcase in the car. After surgery it may be brought to your room.              Please read over the following fact sheets you were given: _____________________________________________________________________             [facility] - Preparing for Surgery Before surgery, you can play an important role.  Because skin is not sterile, your skin needs to be as free of germs as possible.  You can reduce the number of germs on your skin by washing with CHG (chlorahexidine gluconate) soap before surgery.  CHG is an antiseptic cleaner which kills germs and bonds with the skin to continue killing germs even after washing. Please DO NOT use if you have an allergy to CHG or antibacterial soaps.  If your skin becomes reddened/irritated stop using the CHG and inform your nurse when you arrive at Short Stay. Do not shave (including legs and underarms) for at least 48 hours prior to the first CHG shower.  You may shave your face/neck.  Please follow these instructions carefully:  1.  Shower with CHG Soap the night before surgery and the  morning of  surgery.  2.  If you choose to wash your hair, wash your hair first as usual with your normal  shampoo.  3.  After you shampoo, rinse your hair and body thoroughly to remove the shampoo.                             4.  Use CHG as you would any other liquid soap.  You can apply chg directly to the skin and wash.  Gently with a scrungie or clean washcloth.  5.  Apply the CHG Soap to your body ONLY FROM THE NECK DOWN.   Do   not use on face/ open                           Wound or open sores. Avoid contact with eyes, ears mouth and   genitals (private parts).  Wash face,  Genitals (private parts) with your normal soap.             6.  Wash thoroughly, paying special attention to the area where your    surgery  will be performed.  7.  Thoroughly rinse your body with warm water from the neck down.  8.  DO NOT shower/wash with your normal soap after using and rinsing off the CHG Soap.                9.  Pat yourself dry with a clean towel.            10.  Wear clean pajamas.            11.  Place clean sheets on your bed the night of your first shower and do not  sleep with pets. Day of Surgery : Do not apply any lotions/deodorants the morning of surgery.  Please wear clean clothes to the hospital/surgery center.  FAILURE TO FOLLOW THESE INSTRUCTIONS MAY RESULT IN THE CANCELLATION OF YOUR SURGERY  PATIENT SIGNATURE_________________________________  NURSE SIGNATURE__________________________________  ________________________________________________________________________   Caroline [name]  An incentive spirometer is a tool that can help keep your lungs clear and active. This tool measures how well you are filling your lungs with each breath. Taking long deep breaths may help reverse or decrease the chance of developing breathing (pulmonary) problems (especially infection) following:  A long period of time when you are unable to move or be active. BEFORE THE PROCEDURE    If the spirometer includes an indicator to show your best effort, your nurse or respiratory therapist will set it to a desired goal.  If possible, sit up straight or lean slightly forward. Try not to slouch.  Hold the incentive spirometer in an upright position. INSTRUCTIONS FOR USE  1. Sit on the edge of your bed if possible, or sit up as far as you can in bed or on a chair. 2. Hold the incentive spirometer in an upright position. 3. Breathe out normally. 4. Place the mouthpiece in your mouth and seal your lips tightly around it. 5. Breathe in slowly and as deeply as possible, raising the piston or the ball toward the top of the column. 6. Hold your breath for 3-5 seconds or for as long as possible. Allow the piston or ball to fall to the bottom of the column. 7. Remove the mouthpiece from your mouth and breathe out normally. 8. Rest for a few seconds and repeat Steps 1 through 7 at least 10 times every 1-2 hours when you are awake. Take your time and take a few normal breaths between deep breaths. 9. The spirometer may include an indicator to show your best effort. Use the indicator as a goal to work toward during each repetition. 10. After each set of 10 deep breaths, practice coughing to be sure your lungs are clear. If you have an incision (the cut made at the time of surgery), support your incision when coughing by placing a pillow or rolled up towels firmly against it. Once you are able to get out of bed, walk around indoors and cough well. You may stop using the incentive spirometer when instructed by your caregiver.  RISKS AND COMPLICATIONS  Take your time so you do not get dizzy or light-headed.  If you are in pain, you may need to take or ask for pain medication before doing incentive spirometry. It is harder to take a deep breath if you  are having pain. AFTER USE  Rest and breathe slowly and easily.  It can be helpful to keep track of a log of your progress. Your caregiver  can provide you with a simple table to help with this. If you are using the spirometer at home, follow these instructions: [place] IF:   You are having difficultly using the spirometer.  You have trouble using the spirometer as often as instructed.  Your pain medication is not giving enough relief while using the spirometer.  You develop fever of 100.5 F (38.1 C) or higher. SEEK IMMEDIATE MEDICAL CARE IF:   You cough up bloody sputum that had not been present before.  You develop fever of 102 F (38.9 C) or greater.  You develop worsening pain at or near the incision site. MAKE SURE YOU:   Understand these instructions.  Will watch your condition.  Will get help right away if you are not doing well or get worse. Document Released: 04/24/2007 Document Revised: 03/05/2012 Document Reviewed: 06/25/2007 ExitCare Patient Information 2014 ExitCare, [place].   ________________________________________________________________________  WHAT IS A BLOOD TRANSFUSION? Blood Transfusion Information  A transfusion is the replacement of blood or some of its parts. Blood is made up of multiple cells which provide different functions.  Red blood cells carry oxygen and are used for blood loss replacement.  White blood cells fight against infection.  Platelets control bleeding.  Plasma helps clot blood.  Other blood products are available for specialized needs, such as hemophilia or other clotting disorders. BEFORE THE TRANSFUSION  Who gives blood for transfusions?   Healthy volunteers who are fully evaluated to make sure their blood is safe. This is blood bank blood. Transfusion therapy is the safest it has ever been in the practice of medicine. Before blood is taken from a donor, a complete history is taken to make sure that person has no history of diseases nor engages in risky social behavior (examples are intravenous drug use or sexual activity with multiple partners). The  donor's travel history is screened to minimize risk of transmitting infections, such as malaria. The donated blood is tested for signs of infectious diseases, such as HIV and hepatitis. The blood is then tested to be sure it is compatible with you in order to minimize the chance of a transfusion reaction. If you or a relative donates blood, this is often done in anticipation of surgery and is not appropriate for emergency situations. It takes many days to process the donated blood. RISKS AND COMPLICATIONS Although transfusion therapy is very safe and saves many lives, the main dangers of transfusion include:   Getting an infectious disease.  Developing a transfusion reaction. This is an allergic reaction to something in the blood you were given. Every precaution is taken to prevent this. The decision to have a blood transfusion has been considered carefully by your caregiver before blood is given. Blood is not given unless the benefits outweigh the risks. AFTER THE TRANSFUSION  Right after receiving a blood transfusion, you will usually feel much better and more energetic. This is especially true if your red blood cells have gotten low (anemic). The transfusion raises the level of the red blood cells which carry oxygen, and this usually causes an energy increase.  The nurse administering the transfusion will monitor you carefully for complications. HOME CARE INSTRUCTIONS  No special instructions are needed after a transfusion. You may find your energy is better. Speak with your caregiver about any limitations on activity  for underlying diseases you may have. SEEK MEDICAL CARE IF:   Your condition is not improving after your transfusion.  You develop redness or irritation at the intravenous (IV) site. SEEK IMMEDIATE MEDICAL CARE IF:  Any of the following symptoms occur over the next 12 hours:  Shaking chills.  You have a temperature by mouth above 102 F (38.9 C), not controlled by  medicine.  Chest, back, or muscle pain.  People around you feel you are not acting correctly or are confused.  Shortness of breath or difficulty breathing.  Dizziness and fainting.  You get a rash or develop hives.  You have a decrease in urine output.  Your urine turns a dark color or changes to pink, red, or brown. Any of the following symptoms occur over the next 10 days:  You have a temperature by mouth above 102 F (38.9 C), not controlled by medicine.  Shortness of breath.  Weakness after normal activity.  The white part of the eye turns yellow (jaundice).  You have a decrease in the amount of urine or are urinating less often.  Your urine turns a dark color or changes to pink, red, or brown. Document Released: 12/09/2000 Document Revised: 03/05/2012 Document Reviewed: 07/28/2008 [facility] Patient Information 2014 [place], [place].  _______________________________________________________________________

## 2017-11-24 ENCOUNTER — Encounter: Payer: Self-pay | Admitting: Radiation Oncology

## 2017-11-24 ENCOUNTER — Telehealth (INDEPENDENT_AMBULATORY_CARE_PROVIDER_SITE_OTHER): Payer: Self-pay | Admitting: Orthopaedic Surgery

## 2017-11-24 ENCOUNTER — Other Ambulatory Visit (INDEPENDENT_AMBULATORY_CARE_PROVIDER_SITE_OTHER): Payer: Self-pay | Admitting: Family

## 2017-11-24 MED ORDER — HYDROCODONE-ACETAMINOPHEN 5-325 MG PO TABS: 1.0000 | ORAL_TABLET | Freq: Two times a day (BID) | ORAL | 0 refills | Status: DC | PRN

## 2017-11-24 NOTE — Telephone Encounter (Signed)
Patient called needing Rx refilled (Hydrocodone) The number to contact patient is [phone]  Can this be filled? I will get someone else to do it if so

## 2017-11-24 NOTE — Progress Notes (Signed)
  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 Initially, the patient was without acute complaints, however, as her treatment progressed she began to develop mild fatigue, radiation-related skin changes & pruritis within her treatment fields. She was given radiaplex which controlled this well.   Plan: The patient has completed radiation treatment. The patient will return to radiation oncology clinic for routine followup in one month. I advised them to call or return sooner if they have any questions or concerns related to their recovery or treatment.  -----------------------------------  Eppie Gibson, MD  This document serves as a record of services personally performed by Eppie Gibson, MD. It was created on her behalf by Reola Mosher, a trained medical scribe. The creation of this record is based on the scribe's personal observations and the provider's statements to them. This document has been checked and approved by the attending provider.

## 2017-11-24 NOTE — Telephone Encounter (Signed)
Patient called back in regards to Rx and Caryl Pina advised to tell patient she will call back today.

## 2017-11-24 NOTE — Telephone Encounter (Signed)
Can Junie Panning fill this?

## 2017-11-24 NOTE — Telephone Encounter (Signed)
That will be fine to refill if someone will

## 2017-11-24 NOTE — Telephone Encounter (Signed)
Patient hung up before information could be given. Had spoke to [place] in regards to this matter

## 2017-11-27 ENCOUNTER — Other Ambulatory Visit: Payer: Self-pay

## 2017-11-27 ENCOUNTER — Encounter (HOSPITAL_COMMUNITY): Payer: Self-pay

## 2017-11-27 ENCOUNTER — Encounter (HOSPITAL_COMMUNITY)
Admission: RE | Admit: 2017-11-27 | Discharge: 2017-11-27 | Disposition: A | Discharge status: Home / Self Care | Payer: 59 | Source: Ambulatory Visit | Attending: Orthopaedic Surgery | Admitting: Orthopaedic Surgery

## 2017-11-27 DIAGNOSIS — M1612 Unilateral primary osteoarthritis, left hip: Secondary | ICD-10-CM | POA: Insufficient documentation

## 2017-11-27 DIAGNOSIS — Z01818 Encounter for other preprocedural examination: Secondary | ICD-10-CM | POA: Insufficient documentation

## 2017-11-27 HISTORY — DX: Personal history of irradiation: Z92.3

## 2017-11-27 HISTORY — DX: Malignant (primary) neoplasm, unspecified: C80.1

## 2017-11-27 HISTORY — DX: Personal history of urinary calculi: Z87.442

## 2017-11-27 LAB — BASIC METABOLIC PANEL
Anion gap: 7 (ref 5–15)
BUN: 40 mg/dL — AB (ref 6–20)
CO2: 29 mmol/L (ref 22–32)
Calcium: 9.4 mg/dL (ref 8.9–10.3)
Chloride: 106 mmol/L (ref 101–111)
Creatinine, Ser: 1.18 mg/dL — ABNORMAL HIGH (ref 0.44–1.00)
GFR, EST AFRICAN AMERICAN: 57 mL/min — AB (ref >60)
GFR, EST NON AFRICAN AMERICAN: 49 mL/min — AB (ref >60)
Glucose, Bld: 132 mg/dL — ABNORMAL HIGH (ref 65–99)
POTASSIUM: 4.3 mmol/L (ref 3.5–5.1)
SODIUM: 142 mmol/L (ref 135–145)

## 2017-11-27 LAB — CBC
HEMATOCRIT: 35.8 % — AB (ref 36.0–46.0)
Hemoglobin: 12.1 g/dL (ref 12.0–15.0)
MCH: 32.4 pg (ref 26.0–34.0)
MCHC: 33.8 g/dL (ref 30.0–36.0)
MCV: 95.7 fL (ref 78.0–100.0)
Platelets: 256 10*3/uL (ref 150–400)
RBC: 3.74 MIL/uL — ABNORMAL LOW (ref 3.87–5.11)
RDW: 13.3 % (ref 11.5–15.5)
WBC: 6 10*3/uL (ref 4.0–10.5)

## 2017-11-27 LAB — SURGICAL PCR SCREEN
MRSA, PCR: NEGATIVE
STAPHYLOCOCCUS AUREUS: NEGATIVE

## 2017-11-27 NOTE — Pre-Procedure Instructions (Signed)
CBC and BMP results faxed to Dr. Ninfa Linden via epic.

## 2017-11-28 LAB — ABO/RH: ABO/RH(D): O POS

## 2017-11-29 ENCOUNTER — Other Ambulatory Visit (INDEPENDENT_AMBULATORY_CARE_PROVIDER_SITE_OTHER): Payer: Self-pay

## 2017-11-30 ENCOUNTER — Encounter (HOSPITAL_COMMUNITY): Payer: Self-pay | Admitting: Certified Registered Nurse Anesthetist

## 2017-12-01 ENCOUNTER — Encounter (HOSPITAL_COMMUNITY): Payer: Self-pay | Admitting: Emergency Medicine

## 2017-12-01 ENCOUNTER — Inpatient Hospital Stay (HOSPITAL_COMMUNITY)
Admission: RE | Admit: 2017-12-01 | Discharge: 2017-12-04 | DRG: 470 | Disposition: A | Discharge status: Home Health | Payer: 59 | Source: Ambulatory Visit | Attending: Orthopaedic Surgery | Admitting: Orthopaedic Surgery

## 2017-12-01 ENCOUNTER — Inpatient Hospital Stay (HOSPITAL_COMMUNITY): Payer: 59 | Admitting: Certified Registered Nurse Anesthetist

## 2017-12-01 ENCOUNTER — Inpatient Hospital Stay (HOSPITAL_COMMUNITY): Payer: 59

## 2017-12-01 ENCOUNTER — Encounter (HOSPITAL_COMMUNITY)
Admission: RE | Disposition: A | Discharge status: Home Health | Payer: Self-pay | Source: Ambulatory Visit | Attending: Orthopaedic Surgery

## 2017-12-01 ENCOUNTER — Other Ambulatory Visit: Payer: Self-pay

## 2017-12-01 DIAGNOSIS — Z170 Estrogen receptor positive status [ER+]: Secondary | ICD-10-CM | POA: Diagnosis not present

## 2017-12-01 DIAGNOSIS — Z923 Personal history of irradiation: Secondary | ICD-10-CM

## 2017-12-01 DIAGNOSIS — M1612 Unilateral primary osteoarthritis, left hip: Principal | ICD-10-CM | POA: Diagnosis present

## 2017-12-01 DIAGNOSIS — F17210 Nicotine dependence, cigarettes, uncomplicated: Secondary | ICD-10-CM | POA: Diagnosis present

## 2017-12-01 DIAGNOSIS — M25552 Pain in left hip: Secondary | ICD-10-CM

## 2017-12-01 DIAGNOSIS — I10 Essential (primary) hypertension: Secondary | ICD-10-CM | POA: Diagnosis present

## 2017-12-01 DIAGNOSIS — Z96642 Presence of left artificial hip joint: Secondary | ICD-10-CM

## 2017-12-01 DIAGNOSIS — E785 Hyperlipidemia, unspecified: Secondary | ICD-10-CM | POA: Diagnosis present

## 2017-12-01 DIAGNOSIS — I480 Paroxysmal atrial fibrillation: Secondary | ICD-10-CM | POA: Diagnosis present

## 2017-12-01 DIAGNOSIS — Z853 Personal history of malignant neoplasm of breast: Secondary | ICD-10-CM | POA: Diagnosis not present

## 2017-12-01 HISTORY — PX: TOTAL HIP ARTHROPLASTY: SHX124

## 2017-12-01 LAB — TYPE AND SCREEN
ABO/RH(D): O POS
ANTIBODY SCREEN: NEGATIVE

## 2017-12-01 SURGERY — ARTHROPLASTY, HIP, TOTAL, ANTERIOR APPROACH
Anesthesia: Spinal | Site: Hip | Laterality: Left

## 2017-12-01 MED ORDER — HYDROCODONE-ACETAMINOPHEN 5-325 MG PO TABS
1.0000 | ORAL_TABLET | ORAL | Status: DC | PRN
  Administered 2017-12-04: 2 via ORAL
  Filled 2017-12-01: qty 2

## 2017-12-01 MED ORDER — DIPHENHYDRAMINE HCL 12.5 MG/5ML PO ELIX: 12.5000 mg | ORAL_SOLUTION | ORAL | Status: DC | PRN

## 2017-12-01 MED ORDER — PROPOFOL 10 MG/ML IV BOLUS
INTRAVENOUS | Status: DC | PRN
  Administered 2017-12-01: 20 mg via INTRAVENOUS

## 2017-12-01 MED ORDER — ONDANSETRON HCL 4 MG/2ML IJ SOLN
INTRAMUSCULAR | Status: AC
  Filled 2017-12-01: qty 2

## 2017-12-01 MED ORDER — ACETAMINOPHEN 325 MG PO TABS
650.0000 mg | ORAL_TABLET | ORAL | Status: DC | PRN
  Administered 2017-12-01 – 2017-12-03 (×3): 650 mg via ORAL
  Filled 2017-12-01 (×3): qty 2

## 2017-12-01 MED ORDER — CEFAZOLIN SODIUM-DEXTROSE 2-4 GM/100ML-% IV SOLN
2.0000 g | INTRAVENOUS | Status: AC
  Administered 2017-12-01: 2 g via INTRAVENOUS

## 2017-12-01 MED ORDER — IRBESARTAN 150 MG PO TABS
150.0000 mg | ORAL_TABLET | Freq: Every day | ORAL | Status: DC
  Administered 2017-12-04: 150 mg via ORAL
  Filled 2017-12-01 (×2): qty 1

## 2017-12-01 MED ORDER — FENTANYL CITRATE (PF) 100 MCG/2ML IJ SOLN
INTRAMUSCULAR | Status: AC
  Filled 2017-12-01: qty 2

## 2017-12-01 MED ORDER — SODIUM CHLORIDE 0.9 % IV BOLUS (SEPSIS)
500.0000 mL | Freq: Once | INTRAVENOUS | Status: AC
  Administered 2017-12-01: 500 mL via INTRAVENOUS

## 2017-12-01 MED ORDER — HYDROCHLOROTHIAZIDE 12.5 MG PO CAPS
12.5000 mg | ORAL_CAPSULE | Freq: Every day | ORAL | Status: DC
  Administered 2017-12-04: 12.5 mg via ORAL
  Filled 2017-12-01: qty 1

## 2017-12-01 MED ORDER — FENTANYL CITRATE (PF) 100 MCG/2ML IJ SOLN
25.0000 ug | INTRAMUSCULAR | Status: DC | PRN
Start: 2017-12-01 — End: 2017-12-01
  Administered 2017-12-01 (×2): 50 ug via INTRAVENOUS

## 2017-12-01 MED ORDER — CEFAZOLIN SODIUM-DEXTROSE 2-4 GM/100ML-% IV SOLN
INTRAVENOUS | Status: AC
  Filled 2017-12-01: qty 100

## 2017-12-01 MED ORDER — OXYCODONE HCL 5 MG PO TABS
10.0000 mg | ORAL_TABLET | ORAL | Status: DC | PRN
  Administered 2017-12-01 (×3): 15 mg via ORAL
  Administered 2017-12-01: 10 mg via ORAL
  Administered 2017-12-02: 15 mg via ORAL
  Administered 2017-12-02: 10 mg via ORAL
  Administered 2017-12-02 (×5): 15 mg via ORAL
  Administered 2017-12-03: 10 mg via ORAL
  Administered 2017-12-03 (×4): 15 mg via ORAL
  Administered 2017-12-03: 5 mg via ORAL
  Administered 2017-12-04: 15 mg via ORAL
  Filled 2017-12-01 (×2): qty 3
  Filled 2017-12-01: qty 2
  Filled 2017-12-01 (×2): qty 3
  Filled 2017-12-01 (×2): qty 2
  Filled 2017-12-01: qty 3
  Filled 2017-12-01: qty 2
  Filled 2017-12-01 (×9): qty 3

## 2017-12-01 MED ORDER — STERILE WATER FOR IRRIGATION IR SOLN
Status: DC | PRN
  Administered 2017-12-01: 2000 mL

## 2017-12-01 MED ORDER — LIDOCAINE 2% (20 MG/ML) 5 ML SYRINGE
INTRAMUSCULAR | Status: DC | PRN
  Administered 2017-12-01: 60 mg via INTRAVENOUS

## 2017-12-01 MED ORDER — CHLORHEXIDINE GLUCONATE 4 % EX LIQD: 60.0000 mL | Freq: Once | CUTANEOUS | Status: DC

## 2017-12-01 MED ORDER — BUPROPION HCL ER (XL) 150 MG PO TB24
150.0000 mg | ORAL_TABLET | Freq: Every day | ORAL | Status: DC
Start: 2017-12-02 — End: 2017-12-04
  Administered 2017-12-02 – 2017-12-04 (×3): 150 mg via ORAL
  Filled 2017-12-01 (×3): qty 1

## 2017-12-01 MED ORDER — CEFAZOLIN SODIUM-DEXTROSE 1-4 GM/50ML-% IV SOLN
1.0000 g | Freq: Four times a day (QID) | INTRAVENOUS | Status: AC
  Administered 2017-12-01 (×2): 1 g via INTRAVENOUS
  Filled 2017-12-01 (×2): qty 50

## 2017-12-01 MED ORDER — ALUM & MAG HYDROXIDE-SIMETH 200-200-20 MG/5ML PO SUSP: 30.0000 mL | ORAL | Status: DC | PRN

## 2017-12-01 MED ORDER — DEXTROSE 5 % IV SOLN
500.0000 mg | Freq: Four times a day (QID) | INTRAVENOUS | Status: DC | PRN
  Administered 2017-12-01: 500 mg via INTRAVENOUS
  Filled 2017-12-01: qty 550

## 2017-12-01 MED ORDER — EPHEDRINE SULFATE-NACL 50-0.9 MG/10ML-% IV SOSY
PREFILLED_SYRINGE | INTRAVENOUS | Status: DC | PRN
  Administered 2017-12-01: 5 mg via INTRAVENOUS

## 2017-12-01 MED ORDER — PHENYLEPHRINE HCL 10 MG/ML IJ SOLN
INTRAVENOUS | Status: DC | PRN
  Administered 2017-12-01: 50 ug/min via INTRAVENOUS

## 2017-12-01 MED ORDER — MENTHOL 3 MG MT LOZG: 1.0000 | LOZENGE | OROMUCOSAL | Status: DC | PRN

## 2017-12-01 MED ORDER — EPHEDRINE 5 MG/ML INJ
INTRAVENOUS | Status: AC
  Filled 2017-12-01: qty 10

## 2017-12-01 MED ORDER — FENOFIBRATE 160 MG PO TABS
160.0000 mg | ORAL_TABLET | Freq: Every day | ORAL | Status: DC
  Administered 2017-12-02 – 2017-12-04 (×3): 160 mg via ORAL
  Filled 2017-12-01 (×3): qty 1

## 2017-12-01 MED ORDER — ONDANSETRON HCL 4 MG PO TABS: 4.0000 mg | ORAL_TABLET | Freq: Four times a day (QID) | ORAL | Status: DC | PRN

## 2017-12-01 MED ORDER — BUPIVACAINE HCL (PF) 0.75 % IJ SOLN
INTRAMUSCULAR | Status: DC | PRN
  Administered 2017-12-01: 1.8 mL via INTRATHECAL

## 2017-12-01 MED ORDER — METHOCARBAMOL 500 MG PO TABS
500.0000 mg | ORAL_TABLET | Freq: Four times a day (QID) | ORAL | Status: DC | PRN
  Administered 2017-12-01 – 2017-12-02 (×2): 500 mg via ORAL
  Filled 2017-12-01 (×2): qty 1

## 2017-12-01 MED ORDER — VALSARTAN-HYDROCHLOROTHIAZIDE 160-12.5 MG PO TABS: 1.0000 | ORAL_TABLET | Freq: Every day | ORAL | Status: DC

## 2017-12-01 MED ORDER — APIXABAN 5 MG PO TABS
5.0000 mg | ORAL_TABLET | Freq: Two times a day (BID) | ORAL | Status: DC
  Administered 2017-12-02 – 2017-12-04 (×5): 5 mg via ORAL
  Filled 2017-12-01 (×5): qty 1

## 2017-12-01 MED ORDER — MIDAZOLAM HCL 5 MG/5ML IJ SOLN
INTRAMUSCULAR | Status: DC | PRN
  Administered 2017-12-01: 2 mg via INTRAVENOUS

## 2017-12-01 MED ORDER — ZOLPIDEM TARTRATE 5 MG PO TABS: 5.0000 mg | ORAL_TABLET | Freq: Every evening | ORAL | Status: DC | PRN

## 2017-12-01 MED ORDER — DOCUSATE SODIUM 100 MG PO CAPS
100.0000 mg | ORAL_CAPSULE | Freq: Two times a day (BID) | ORAL | Status: DC
  Administered 2017-12-01 – 2017-12-04 (×6): 100 mg via ORAL
  Filled 2017-12-01 (×6): qty 1

## 2017-12-01 MED ORDER — TRANEXAMIC ACID 1000 MG/10ML IV SOLN
1000.0000 mg | INTRAVENOUS | Status: AC
  Administered 2017-12-01: 1000 mg via INTRAVENOUS
  Filled 2017-12-01: qty 1100

## 2017-12-01 MED ORDER — TAMOXIFEN CITRATE 10 MG PO TABS
20.0000 mg | ORAL_TABLET | Freq: Every day | ORAL | Status: DC
  Administered 2017-12-02 – 2017-12-04 (×3): 20 mg via ORAL
  Filled 2017-12-01 (×3): qty 2

## 2017-12-01 MED ORDER — LACTATED RINGERS IV SOLN
INTRAVENOUS | Status: DC
  Administered 2017-12-01 (×3): via INTRAVENOUS

## 2017-12-01 MED ORDER — ONDANSETRON HCL 4 MG/2ML IJ SOLN: 4.0000 mg | Freq: Four times a day (QID) | INTRAMUSCULAR | Status: DC | PRN

## 2017-12-01 MED ORDER — HYDROMORPHONE HCL 1 MG/ML IJ SOLN
1.0000 mg | INTRAMUSCULAR | Status: DC | PRN
  Administered 2017-12-01 – 2017-12-04 (×5): 1 mg via INTRAVENOUS
  Filled 2017-12-01 (×5): qty 1

## 2017-12-01 MED ORDER — LIDOCAINE 2% (20 MG/ML) 5 ML SYRINGE
INTRAMUSCULAR | Status: AC
  Filled 2017-12-01: qty 5

## 2017-12-01 MED ORDER — METOPROLOL SUCCINATE ER 25 MG PO TB24
25.0000 mg | ORAL_TABLET | Freq: Every day | ORAL | Status: DC
  Administered 2017-12-01: 25 mg via ORAL
  Filled 2017-12-01 (×2): qty 1

## 2017-12-01 MED ORDER — PHENYLEPHRINE HCL 10 MG/ML IJ SOLN
INTRAMUSCULAR | Status: AC
  Filled 2017-12-01: qty 2

## 2017-12-01 MED ORDER — NICOTINE 7 MG/24HR TD PT24
7.0000 mg | MEDICATED_PATCH | Freq: Every day | TRANSDERMAL | Status: DC
  Administered 2017-12-01 – 2017-12-04 (×3): 7 mg via TRANSDERMAL
  Filled 2017-12-01 (×4): qty 1

## 2017-12-01 MED ORDER — MEPERIDINE HCL 50 MG/ML IJ SOLN: 6.2500 mg | INTRAMUSCULAR | Status: DC | PRN

## 2017-12-01 MED ORDER — PROPOFOL 10 MG/ML IV BOLUS
INTRAVENOUS | Status: AC
  Filled 2017-12-01: qty 20

## 2017-12-01 MED ORDER — PROPOFOL 500 MG/50ML IV EMUL
INTRAVENOUS | Status: DC | PRN
  Administered 2017-12-01: 100 ug/kg/min via INTRAVENOUS

## 2017-12-01 MED ORDER — PHENYLEPHRINE 40 MCG/ML (10ML) SYRINGE FOR IV PUSH (FOR BLOOD PRESSURE SUPPORT)
PREFILLED_SYRINGE | INTRAVENOUS | Status: DC | PRN
  Administered 2017-12-01 (×4): 80 ug via INTRAVENOUS

## 2017-12-01 MED ORDER — SODIUM CHLORIDE 0.9 % IV SOLN
INTRAVENOUS | Status: DC
  Administered 2017-12-01: 75 mL/h via INTRAVENOUS

## 2017-12-01 MED ORDER — PROPOFOL 10 MG/ML IV BOLUS
INTRAVENOUS | Status: AC
  Filled 2017-12-01: qty 40

## 2017-12-01 MED ORDER — SODIUM CHLORIDE 0.9 % IR SOLN
Status: DC | PRN
  Administered 2017-12-01: 1000 mL

## 2017-12-01 MED ORDER — METOCLOPRAMIDE HCL 5 MG/ML IJ SOLN: 5.0000 mg | Freq: Three times a day (TID) | INTRAMUSCULAR | Status: DC | PRN

## 2017-12-01 MED ORDER — ONDANSETRON HCL 4 MG/2ML IJ SOLN
INTRAMUSCULAR | Status: DC | PRN
  Administered 2017-12-01: 4 mg via INTRAVENOUS

## 2017-12-01 MED ORDER — MIDAZOLAM HCL 2 MG/2ML IJ SOLN
INTRAMUSCULAR | Status: AC
  Filled 2017-12-01: qty 2

## 2017-12-01 MED ORDER — METOCLOPRAMIDE HCL 5 MG PO TABS: 5.0000 mg | ORAL_TABLET | Freq: Three times a day (TID) | ORAL | Status: DC | PRN

## 2017-12-01 MED ORDER — ACETAMINOPHEN 650 MG RE SUPP: 650.0000 mg | RECTAL | Status: DC | PRN

## 2017-12-01 MED ORDER — LACTATED RINGERS IV SOLN: INTRAVENOUS | Status: DC

## 2017-12-01 MED ORDER — METOCLOPRAMIDE HCL 5 MG/ML IJ SOLN: 10.0000 mg | Freq: Once | INTRAMUSCULAR | Status: DC | PRN

## 2017-12-01 MED ORDER — FENTANYL CITRATE (PF) 100 MCG/2ML IJ SOLN
INTRAMUSCULAR | Status: DC | PRN
  Administered 2017-12-01 (×2): 50 ug via INTRAVENOUS

## 2017-12-01 MED ORDER — POLYETHYLENE GLYCOL 3350 17 G PO PACK: 17.0000 g | PACK | Freq: Every day | ORAL | Status: DC | PRN

## 2017-12-01 MED ORDER — PHENOL 1.4 % MT LIQD: 1.0000 | OROMUCOSAL | Status: DC | PRN

## 2017-12-01 SURGICAL SUPPLY — 37 items
APL SKNCLS STERI-STRIP NONHPOA (GAUZE/BANDAGES/DRESSINGS) ×1
BAG SPEC THK2 15X12 ZIP CLS (MISCELLANEOUS)
BAG ZIPLOCK 12X15 (MISCELLANEOUS) IMPLANT
BENZOIN TINCTURE PRP APPL 2/3 (GAUZE/BANDAGES/DRESSINGS) ×1 IMPLANT
BLADE SAW SGTL 18X1.27X75 (BLADE) ×2 IMPLANT
CAPT HIP TOTAL 2 ×1 IMPLANT
CELLS DAT CNTRL 66122 CELL SVR (MISCELLANEOUS) IMPLANT
COVER PERINEAL POST (MISCELLANEOUS) ×2 IMPLANT
COVER SURGICAL LIGHT HANDLE (MISCELLANEOUS) ×2 IMPLANT
DRAPE STERI IOBAN 125X83 (DRAPES) ×2 IMPLANT
DRAPE U-SHAPE 47X51 STRL (DRAPES) ×4 IMPLANT
DRSG AQUACEL AG ADV 3.5X10 (GAUZE/BANDAGES/DRESSINGS) ×2 IMPLANT
DURAPREP 26ML APPLICATOR (WOUND CARE) ×2 IMPLANT
ELECT REM PT RETURN 15FT ADLT (MISCELLANEOUS) ×2 IMPLANT
GAUZE XEROFORM 1X8 LF (GAUZE/BANDAGES/DRESSINGS) ×1 IMPLANT
GLOVE BIO SURGEON STRL SZ7.5 (GLOVE) ×2 IMPLANT
GLOVE BIOGEL PI IND STRL 8 (GLOVE) ×2 IMPLANT
GLOVE BIOGEL PI INDICATOR 8 (GLOVE) ×2
GLOVE ECLIPSE 8.0 STRL XLNG CF (GLOVE) ×2 IMPLANT
GOWN STRL REUS W/TWL XL LVL3 (GOWN DISPOSABLE) ×4 IMPLANT
HANDPIECE INTERPULSE COAX TIP (DISPOSABLE) ×2
HOLDER FOLEY CATH W/STRAP (MISCELLANEOUS) ×2 IMPLANT
PACK ANTERIOR HIP CUSTOM (KITS) ×2 IMPLANT
RETRACTOR WND ALEXIS 18 MED (MISCELLANEOUS) IMPLANT
RTRCTR WOUND ALEXIS 18CM MED (MISCELLANEOUS)
SET HNDPC FAN SPRY TIP SCT (DISPOSABLE) ×1 IMPLANT
STAPLER VISISTAT 35W (STAPLE) IMPLANT
STRIP CLOSURE SKIN 1/2X4 (GAUZE/BANDAGES/DRESSINGS) ×1 IMPLANT
SUT ETHIBOND NAB CT1 #1 30IN (SUTURE) ×2 IMPLANT
SUT MNCRL AB 4-0 PS2 18 (SUTURE) ×1 IMPLANT
SUT VIC AB 0 CT1 36 (SUTURE) ×2 IMPLANT
SUT VIC AB 1 CT1 36 (SUTURE) ×2 IMPLANT
SUT VIC AB 2-0 CT1 27 (SUTURE) ×4
SUT VIC AB 2-0 CT1 TAPERPNT 27 (SUTURE) ×2 IMPLANT
TRAY FOLEY CATH 14FRSI W/METER (CATHETERS) ×1 IMPLANT
TRAY FOLEY W/METER SILVER 16FR (SET/KITS/TRAYS/PACK) ×1 IMPLANT
YANKAUER SUCT BULB TIP 10FT TU (MISCELLANEOUS) ×2 IMPLANT

## 2017-12-01 NOTE — Anesthesia Preprocedure Evaluation (Signed)
Anesthesia Evaluation  Patient identified by MRN, date of birth, ID band Patient awake    Reviewed: Allergy & Precautions, NPO status , Patient's Chart, lab work & pertinent test results  Airway Mallampati: II  TM Distance: >3 FB Neck ROM: Full    Dental no notable dental hx.    Pulmonary Current Smoker,    Pulmonary exam normal breath sounds clear to auscultation       Cardiovascular hypertension, Pt. on medications Normal cardiovascular exam+ dysrhythmias Atrial Fibrillation  Rhythm:Regular Rate:Normal     Neuro/Psych negative neurological ROS  negative psych ROS   GI/Hepatic negative GI ROS, Neg liver ROS,   Endo/Other  negative endocrine ROS  Renal/GU negative Renal ROS  negative genitourinary   Musculoskeletal negative musculoskeletal ROS (+)   Abdominal   Peds negative pediatric ROS (+)  Hematology negative hematology ROS (+)   Anesthesia Other Findings   Reproductive/Obstetrics negative OB ROS                             Anesthesia Physical Anesthesia Plan  ASA: III  Anesthesia Plan: Spinal   Post-op Pain Management:    Induction:   PONV Risk Score and Plan: 1 and Ondansetron  Airway Management Planned: Simple Face Mask  Additional Equipment:   Intra-op Plan:   Post-operative Plan: Extubation in OR  Informed Consent: I have reviewed the patients History and Physical, chart, labs and discussed the procedure including the risks, benefits and alternatives for the proposed anesthesia with the patient or authorized representative who has indicated his/her understanding and acceptance.   Dental advisory given  Plan Discussed with: CRNA  Anesthesia Plan Comments:         Anesthesia Quick Evaluation

## 2017-12-01 NOTE — H&P (Signed)
TOTAL HIP ADMISSION H&P  Patient is admitted for left total hip arthroplasty.  Subjective:  Chief Complaint: left hip pain  HPI: Caroline [name], 61 y.o. female, has a history of pain and functional disability in the left hip(s) due to arthritis and patient has failed non-surgical conservative treatments for greater than 12 weeks to include NSAID's and/or analgesics, corticosteriod injections, flexibility and strengthening excercises, weight reduction as appropriate and activity modification.  Onset of symptoms was gradual starting 3 years ago with gradually worsening course since that time.The patient noted no past surgery on the left hip(s).  Patient currently rates pain in the left hip at 10 out of 10 with activity. Patient has night pain, worsening of pain with activity and weight bearing, pain that interfers with activities of daily living and pain with passive range of motion. Patient has evidence of subchondral sclerosis, periarticular osteophytes and joint space narrowing by imaging studies. This condition presents safety issues increasing the risk of falls.  There is no current active infection.  Patient Active Problem List   Diagnosis Date Noted  . Status post total replacement of left hip 12/01/2017  . Pain in left hip 10/05/2017  . Unilateral primary osteoarthritis, left hip 10/05/2017  . Carcinoma of lower-outer quadrant of right breast in female, estrogen receptor positive ([place]) 08/30/2017  . Hyperlipidemia 01/03/2013  . Chest pain 11/26/2011  . Tobacco abuse 11/26/2011  . HTN (hypertension) 11/26/2011   Past Medical History:  Diagnosis Date  . Arthritis    hips  . Cancer ([place])    Left breast  . History of kidney stones   . Hypertension   . Paroxysmal atrial fibrillation ([facility])   . S/P radiation therapy     Past Surgical History:  Procedure Laterality Date  . BREAST LUMPECTOMY WITH RADIOACTIVE SEED AND SENTINEL LYMPH NODE BIOPSY Right 08/17/2017   Procedure: RIGHT  BREAST RADIOACTIVE SEED LOCALIZED LUMPECTOMY AND SENTINEL LYMPH NODE BIOPSY;  Surgeon: Erroll Luna, MD;  Location: [facility];  Service: General;  Laterality: Right;  . CESAREAN SECTION  [phone]  . COLONOSCOPY    . HEMORRHOID SURGERY     20 years ago  . RE-EXCISION OF BREAST LUMPECTOMY Right 09/06/2017   Procedure: RE-EXCISION OF RIGHT BREAST LUMPECTOMY;  Surgeon: Erroll Luna, MD;  Location: [facility];  Service: General;  Laterality: Right;  . TOE SURGERY Right    5th 20 years ago    Current Facility-Administered Medications  Medication Dose Route Frequency Provider Last Rate Last Dose  . ceFAZolin (ANCEF) 2-4 GM/100ML-% IVPB           . ceFAZolin (ANCEF) IVPB 2g/100 mL premix  2 g Intravenous On Call to OR Pete Pelt, PA-C      . chlorhexidine (HIBICLENS) 4 % liquid 4 application  60 mL Topical Once Pete Pelt, PA-C      . lactated ringers infusion   Intravenous Continuous Montez Hageman, MD 50 mL/hr at 12/01/17 [id number]    . tranexamic acid (CYKLOKAPRON) 1,000 mg in sodium chloride 0.9 % 100 mL IVPB  1,000 mg Intravenous To OR Pete Pelt, PA-C       Allergies  Allergen Reactions  . Sulfa Antibiotics Other (See Comments)    Unknown, was child    Social History   Tobacco Use  . Smoking status: Current Every Day Smoker    Packs/day: 0.50    Years: 40.00    Pack years: 20.00    Types: Cigarettes  .  Smokeless tobacco: Never Used  Substance Use Topics  . Alcohol use: Yes    Alcohol/week: 4.2 oz    Types: 7 Glasses of wine per week    Comment: wine nightly    Family History  Problem Relation Age of Onset  . Breast cancer Cousin      Review of Systems  Musculoskeletal: Positive for joint pain.    Objective:  Physical Exam  Constitutional: She is oriented to person, place, and time. She appears well-developed and well-nourished.  HENT:  Head: Normocephalic and atraumatic.  Eyes: EOM are normal.  Neck: Normal  range of motion. Neck supple.  Cardiovascular: Normal rate and regular rhythm.  Respiratory: Effort normal and breath sounds normal.  GI: Soft. Bowel sounds are normal.  Musculoskeletal:       Left hip: She exhibits decreased range of motion, decreased strength, tenderness and bony tenderness.  Neurological: She is alert and oriented to person, place, and time.  Skin: Skin is warm and dry.  Psychiatric: She has a normal mood and affect.    Vital signs in last 24 hours: Temp:  [98.3 F (36.8 C)] 98.3 F (36.8 C) (12/07 0555) Pulse Rate:  [72] 72 (12/07 0555) Resp:  [18] 18 (12/07 0555) BP: (113)/(78) 113/78 (12/07 0555) SpO2:  [100 %] 100 % (12/07 0555) Weight:  [166 lb (75.3 kg)] 166 lb (75.3 kg) (12/07 2458)  Labs:   Estimated body mass index is 28.49 kg/m as calculated from the following:   Height as of this encounter: 5\' 4"  (1.626 m).   Weight as of this encounter: 166 lb (75.3 kg).   Imaging Review Plain radiographs demonstrate severe degenerative joint disease of the left hip(s). The bone quality appears to be excellent for age and reported activity level.  Assessment/Plan:  End stage arthritis, left hip(s)  The patient history, physical examination, clinical judgement of the provider and imaging studies are consistent with end stage degenerative joint disease of the left hip(s) and total hip arthroplasty is deemed medically necessary. The treatment options including medical management, injection therapy, arthroscopy and arthroplasty were discussed at length. The risks and benefits of total hip arthroplasty were presented and reviewed. The risks due to aseptic loosening, infection, stiffness, dislocation/subluxation,  thromboembolic complications and other imponderables were discussed.  The patient acknowledged the explanation, agreed to proceed with the plan and consent was signed. Patient is being admitted for inpatient treatment for surgery, pain control, PT, OT,  prophylactic antibiotics, VTE prophylaxis, progressive ambulation and ADL's and discharge planning.The patient is planning to be discharged home with home health services

## 2017-12-01 NOTE — Anesthesia Procedure Notes (Signed)
Spinal  Patient location during procedure: OR Start time: 12/01/2017 7:34 AM End time: 12/01/2017 7:37 AM Staffing Anesthesiologist: Montez Hageman, MD Resident/CRNA: Claudia Desanctis, CRNA Performed: resident/CRNA  Preanesthetic Checklist Completed: patient identified, site marked, surgical consent, pre-op evaluation, timeout performed, IV checked, risks and benefits discussed and monitors and equipment checked Spinal Block Patient position: sitting Prep: DuraPrep Patient monitoring: heart rate, cardiac monitor, continuous pulse ox and blood pressure Approach: midline Location: L3-4 Injection technique: single-shot Needle Needle type: Quincke  Needle gauge: 24 G Needle length: 10 cm Needle insertion depth: 8.5 cm Assessment Sensory level: T6

## 2017-12-01 NOTE — Brief Op Note (Signed)
12/01/2017  8:51 AM  PATIENT:  Caroline [name]  61 y.o. female  PRE-OPERATIVE DIAGNOSIS:  osteoarthritis left hip  POST-OPERATIVE DIAGNOSIS:  osteoarthritis left hip  PROCEDURE:  Procedure(s): LEFT TOTAL HIP ARTHROPLASTY ANTERIOR APPROACH (Left)  SURGEON:  Surgeon(s) and Role:    Mcarthur Rossetti, MD - Primary  PHYSICIAN ASSISTANT: Benita Stabile, PA-C  ANESTHESIA:   spinal  EBL:  300 mL   COUNTS:  YES  DICTATION: .Other Dictation: Dictation Number [id number]  PLAN OF CARE: Admit to inpatient   PATIENT DISPOSITION:  PACU - hemodynamically stable.   Delay start of Pharmacological VTE agent (>24hrs) due to surgical blood loss or risk of bleeding: no

## 2017-12-01 NOTE — Anesthesia Procedure Notes (Signed)
Date/Time: 12/01/2017 7:40 AM Performed by: Claudia Desanctis, CRNA Oxygen Delivery Method: Simple face mask

## 2017-12-01 NOTE — Transfer of Care (Signed)
Immediate Anesthesia Transfer of Care Note  Patient: Caroline [name]  Procedure(s) Performed: LEFT TOTAL HIP ARTHROPLASTY ANTERIOR APPROACH (Left Hip)  Patient Location: PACU  Anesthesia Type:Spinal  Level of Consciousness: awake and patient cooperative  Airway & Oxygen Therapy: Patient Spontanous Breathing and Patient connected to face mask  Post-op Assessment: Report given to RN and Post -op Vital signs reviewed and stable  Post vital signs: Reviewed and stable  Last Vitals:  Vitals:   12/01/17 0555  BP: 113/78  Pulse: 72  Resp: 18  Temp: 36.8 C  SpO2: 100%    Last Pain:  Vitals:   12/01/17 0608  TempSrc:   PainSc: 0-No pain      Patients Stated Pain Goal: 4 (14/97/02 6378)  Complications: No apparent anesthesia complications

## 2017-12-01 NOTE — Anesthesia Postprocedure Evaluation (Signed)
Anesthesia Post Note  Patient: Caroline [name]  Procedure(s) Performed: LEFT TOTAL HIP ARTHROPLASTY ANTERIOR APPROACH (Left Hip)     Patient location during evaluation: PACU Anesthesia Type: Spinal Level of consciousness: awake and alert Pain management: pain level controlled Vital Signs Assessment: post-procedure vital signs reviewed and stable Respiratory status: spontaneous breathing and respiratory function stable Cardiovascular status: blood pressure returned to baseline and stable Postop Assessment: no headache, no backache, spinal receding and no apparent nausea or vomiting Anesthetic complications: no    Last Vitals:  Vitals:   12/01/17 1338 12/01/17 1428  BP: (!) 79/54 (!) 87/62  Pulse: 79 86  Resp: 16 16  Temp: 36.9 C 37.2 C  SpO2: 99% 100%    Last Pain:  Vitals:   12/01/17 1428  TempSrc: Oral  PainSc:                  Montez Hageman

## 2017-12-02 LAB — CBC
HEMATOCRIT: 30.1 % — AB (ref 36.0–46.0)
Hemoglobin: 10 g/dL — ABNORMAL LOW (ref 12.0–15.0)
MCH: 32.2 pg (ref 26.0–34.0)
MCHC: 33.2 g/dL (ref 30.0–36.0)
MCV: 96.8 fL (ref 78.0–100.0)
Platelets: 172 10*3/uL (ref 150–400)
RBC: 3.11 MIL/uL — ABNORMAL LOW (ref 3.87–5.11)
RDW: 13.2 % (ref 11.5–15.5)
WBC: 5.6 10*3/uL (ref 4.0–10.5)

## 2017-12-02 LAB — BASIC METABOLIC PANEL
ANION GAP: 7 (ref 5–15)
BUN: 14 mg/dL (ref 6–20)
CALCIUM: 8.3 mg/dL — AB (ref 8.9–10.3)
CO2: 27 mmol/L (ref 22–32)
Chloride: 98 mmol/L — ABNORMAL LOW (ref 101–111)
Creatinine, Ser: 0.86 mg/dL (ref 0.44–1.00)
Glucose, Bld: 94 mg/dL (ref 65–99)
Potassium: 3.8 mmol/L (ref 3.5–5.1)
SODIUM: 132 mmol/L — AB (ref 135–145)

## 2017-12-02 MED ORDER — OXYCODONE-ACETAMINOPHEN 5-325 MG PO TABS: 1.0000 | ORAL_TABLET | ORAL | 0 refills | Status: DC | PRN

## 2017-12-02 MED ORDER — GABAPENTIN 100 MG PO CAPS
100.0000 mg | ORAL_CAPSULE | Freq: Three times a day (TID) | ORAL | Status: DC
  Administered 2017-12-02 – 2017-12-04 (×7): 100 mg via ORAL
  Filled 2017-12-02 (×7): qty 1

## 2017-12-02 MED ORDER — METHOCARBAMOL 500 MG PO TABS: 500.0000 mg | ORAL_TABLET | Freq: Four times a day (QID) | ORAL | 0 refills | Status: DC | PRN

## 2017-12-02 MED ORDER — FERROUS GLUCONATE 324 (38 FE) MG PO TABS
324.0000 mg | ORAL_TABLET | Freq: Three times a day (TID) | ORAL | Status: DC
  Administered 2017-12-02 – 2017-12-04 (×7): 324 mg via ORAL
  Filled 2017-12-02 (×8): qty 1

## 2017-12-02 MED ORDER — GABAPENTIN 100 MG PO CAPS: 100.0000 mg | ORAL_CAPSULE | Freq: Three times a day (TID) | ORAL | 0 refills | Status: DC

## 2017-12-02 NOTE — Progress Notes (Addendum)
     Subjective: 1 Day Post-Op Procedure(s) (LRB): LEFT TOTAL HIP ARTHROPLASTY ANTERIOR APPROACH (Left)Left thigh with numbness and burning pain in the distribution of the lateral cutaneous branch of the femoral nerve. A couple of steps with PT today, not out of room, continue with PT/OT. Patient reports pain as marked.    Objective:   VITALS:  Temp:  [97.8 F (36.6 C)-99.6 F (37.6 C)] 99.6 F (37.6 C) (12/08 0502) Pulse Rate:  [54-86] 84 (12/08 0502) Resp:  [10-17] 14 (12/08 0502) BP: (79-121)/(54-91) 93/60 (12/08 0502) SpO2:  [97 %-100 %] 100 % (12/08 0502) Weight:  [166 lb (75.3 kg)] 166 lb (75.3 kg) (12/07 1129)  Neurologically intact ABD soft Neurovascular intact Sensation intact distally Intact pulses distally Dorsiflexion/Plantar flexion intact Incision: dressing C/D/I and no drainage   LABS Recent Labs    12/02/17 0515  HGB 10.0*  WBC 5.6  PLT 172   Recent Labs    12/02/17 0515  NA 132*  K 3.8  CL 98*  CO2 27  BUN 14  CREATININE 0.86  GLUCOSE 94   No results for input(s): LABPT, INR in the last 72 hours.   Assessment/Plan: 1 Day Post-Op Procedure(s) (LRB): LEFT TOTAL HIP ARTHROPLASTY ANTERIOR APPROACH (Left)  Advance diet Up with therapy D/C IV fluids  Will start gabapentin for neurogenic pain.  Basil Dess 12/02/2017, 9:28 AMPatient ID: Caroline [name], female   DOB: [birth year], 61 y.o.   MRN: [id number]

## 2017-12-02 NOTE — Progress Notes (Signed)
Physical Therapy Treatment Patient Details Name: Caroline [name] MRN: [id number] DOB: 1956-04-13 Today's Date: 12/02/2017    History of Present Illness 61yo F s/p L THA, AA. PMH: breast CA    PT Comments    POD # 1 pm session Assisted with amb 25 feet with increased time due to pain.  Assisted back to bed sidelying to comfort.  Follow Up Recommendations  Home health PT;DC plan and follow up therapy as arranged by surgeon     Equipment Recommendations  Rolling walker with 5" wheels    Recommendations for Other Services OT consult     Precautions / Restrictions Precautions Precautions: Fall Restrictions Weight Bearing Restrictions: No Other Position/Activity Restrictions: WBAT    Mobility  Bed Mobility Overal bed mobility: Needs Assistance Bed Mobility: Sit to Sidelying     Supine to sit: Min assist;HOB elevated   Sit to sidelying: Min assist General bed mobility comments: Increased time with cues for technique, assist with LLE  Transfers Overall transfer level: Needs assistance Equipment used: Rolling walker (2 wheeled) Transfers: Sit to/from [facility] Sit to Stand: Min assist Stand pivot transfers: Min assist       General transfer comment: cues for hand and leg placement, cues for technique  Ambulation/Gait Ambulation/Gait assistance: Min guard Ambulation Distance (Feet): 25 Feet Assistive device: Rolling walker (2 wheeled) Gait Pattern/deviations: Step-to pattern;Decreased step length - right;Decreased step length - left;Decreased stance time - left Gait velocity: decreased   General Gait Details: required MAX encouragement and increased time.  no c/o dizziness BP after 10 feet    98/54   Stairs            Wheelchair Mobility    Modified Rankin (Stroke Patients Only)       Balance                                            Cognition Arousal/Alertness: Awake/alert Behavior During Therapy: WFL for  tasks assessed/performed Overall Cognitive Status: Within Functional Limits for tasks assessed                                        Exercises    General Comments        Pertinent Vitals/Pain Pain Assessment: 0-10 Pain Score: 6  Pain Location: L hip Pain Descriptors / Indicators: Aching;Burning;Sore Pain Intervention(s): Monitored during session;Repositioned    Home Living Family/patient expects to be discharged to:: Private residence Living Arrangements: Spouse/significant other;Children Available Help at Discharge: Family Type of Home: House Home Access: Stairs to enter Entrance Stairs-Rails: None Home Layout: Two level;1/2 bath on main level Home Equipment: None      Prior Function Level of Independence: Independent          PT Goals (current goals can now be found in the care plan section) Acute Rehab PT Goals Patient Stated Goal: regain independence, decreased pain PT Goal Formulation: With patient Time For Goal Achievement: 12/06/17 Potential to Achieve Goals: Good Progress towards PT goals: Progressing toward goals    Frequency    7X/week      PT Plan Current plan remains appropriate    Co-evaluation PT/OT/SLP Co-Evaluation/Treatment: Yes Reason for Co-Treatment: For patient/therapist safety PT goals addressed during session: Mobility/safety with mobility OT goals addressed during session: ADL's  and self-care      AM-PAC PT "6 Clicks" Daily Activity  Outcome Measure  Difficulty turning over in bed (including adjusting bedclothes, sheets and blankets)?: Unable Difficulty moving from lying on back to sitting on the side of the bed? : Unable Difficulty sitting down on and standing up from a chair with arms (e.g., wheelchair, bedside commode, etc,.)?: Unable Help needed moving to and from a bed to chair (including a wheelchair)?: A Lot Help needed walking in hospital room?: A Lot Help needed climbing 3-5 steps with a railing? :  Total 6 Click Score: 8    End of Session Equipment Utilized During Treatment: Gait belt Activity Tolerance: Patient tolerated treatment well Patient left: in bed;with call bell/phone within reach Nurse Communication: Mobility status PT Visit Diagnosis: Unsteadiness on feet (R26.81);Difficulty in walking, not elsewhere classified (R26.2);Pain Pain - Right/Left: Left Pain - part of body: Hip     Time: 1540-1605 PT Time Calculation (min) (ACUTE ONLY): 25 min  Charges:  $Gait Training: 8-22 mins $Therapeutic Activity: 8-22 mins                    G Codes:       Rica Koyanagi  PTA [facility]  Acute  Rehab Pager      [phone]

## 2017-12-02 NOTE — Evaluation (Signed)
Physical Therapy Evaluation Patient Details Name: Caroline [name] MRN: [id number] DOB: 05-Dec-1956 Today's Date: 12/02/2017   History of Present Illness  61yo F s/p L THA, AA. PMH: breast CA  Clinical Impression  Pt s/p L THR and presents with decreased L LE strength/ROM and post op pain limiting functional mobility.  Pt should progress to dc home with family assist.    Follow Up Recommendations Home health PT;DC plan and follow up therapy as arranged by surgeon    Equipment Recommendations  Rolling walker with 5" wheels    Recommendations for Other Services OT consult     Precautions / Restrictions Precautions Precautions: Fall Restrictions Weight Bearing Restrictions: No Other Position/Activity Restrictions: WBAT      Mobility  Bed Mobility Overal bed mobility: Needs Assistance Bed Mobility: Supine to Sit     Supine to sit: Min assist;HOB elevated     General bed mobility comments: Increased time with cues for technique, assist with LLE  Transfers Overall transfer level: Needs assistance Equipment used: Rolling walker (2 wheeled) Transfers: Sit to/from [facility] Sit to Stand: Mod assist;Min assist;From elevated surface Stand pivot transfers: Min assist       General transfer comment: cues for hand and leg placement, cues for technique  Ambulation/Gait             General Gait Details: ltd to stand pvt bed to [facility] to chair 2* dizziness and decreased BP  Stairs            Wheelchair Mobility    Modified Rankin (Stroke Patients Only)       Balance                                             Pertinent Vitals/Pain Pain Assessment: 0-10 Pain Score: 8  Pain Location: L hip Pain Descriptors / Indicators: Aching;Burning;Sore Pain Intervention(s): Limited activity within patient's tolerance;Monitored during session;Premedicated before session;Ice applied    Home Living Family/patient expects to be  discharged to:: Private residence Living Arrangements: Spouse/significant other;Children Available Help at Discharge: Family Type of Home: House Home Access: Stairs to enter Entrance Stairs-Rails: None Technical brewer of Steps: 2 Home Layout: Two level;1/2 bath on main level Home Equipment: None      Prior Function Level of Independence: Independent               Hand Dominance        Extremity/Trunk Assessment   Upper Extremity Assessment Upper Extremity Assessment: Overall WFL for tasks assessed    Lower Extremity Assessment Lower Extremity Assessment: LLE deficits/detail    Cervical / Trunk Assessment Cervical / Trunk Assessment: Normal  Communication   Communication: No difficulties  Cognition Arousal/Alertness: Awake/alert Behavior During Therapy: WFL for tasks assessed/performed Overall Cognitive Status: Within Functional Limits for tasks assessed                                        General Comments      Exercises Total Joint Exercises Ankle Circles/Pumps: AROM;Both;15 reps;Supine   Assessment/Plan    PT Assessment Patient needs continued PT services  PT Problem List Decreased strength;Decreased range of motion;Decreased activity tolerance;Decreased mobility;Decreased knowledge of use of DME;Pain;Decreased knowledge of precautions       PT Treatment Interventions DME instruction;Gait  training;Stair training;Functional mobility training;Therapeutic activities;Therapeutic exercise;Patient/family education    PT Goals (Current goals can be found in the Care Plan section)  Acute Rehab PT Goals Patient Stated Goal: regain independence, decreased pain PT Goal Formulation: With patient Time For Goal Achievement: 12/06/17 Potential to Achieve Goals: Good    Frequency 7X/week   Barriers to discharge        Co-evaluation PT/OT/SLP Co-Evaluation/Treatment: Yes Reason for Co-Treatment: For patient/therapist safety PT  goals addressed during session: Mobility/safety with mobility OT goals addressed during session: ADL's and self-care       AM-PAC PT "6 Clicks" Daily Activity  Outcome Measure Difficulty turning over in bed (including adjusting bedclothes, sheets and blankets)?: Unable Difficulty moving from lying on back to sitting on the side of the bed? : Unable Difficulty sitting down on and standing up from a chair with arms (e.g., wheelchair, bedside commode, etc,.)?: Unable Help needed moving to and from a bed to chair (including a wheelchair)?: A Lot Help needed walking in hospital room?: A Lot Help needed climbing 3-5 steps with a railing? : Total 6 Click Score: 8    End of Session Equipment Utilized During Treatment: Gait belt Activity Tolerance: Patient limited by pain;Other (comment)(low BP) Patient left: in chair;with call bell/phone within reach Nurse Communication: Mobility status PT Visit Diagnosis: Unsteadiness on feet (R26.81);Difficulty in walking, not elsewhere classified (R26.2);Pain Pain - Right/Left: Left Pain - part of body: Hip    Time: 6503-5465 PT Time Calculation (min) (ACUTE ONLY): 30 min   Charges:   PT Evaluation $PT Eval Low Complexity: 1 Low     PT G Codes:        Pg [phone]   Tobie Perdue 12/02/2017, 1:07 PM

## 2017-12-02 NOTE — Progress Notes (Signed)
Occupational Therapy Evaluation Patient Details Name: Caroline [name] MRN: [id number] DOB: 09-21-1956 Today's Date: 12/02/2017    History of Present Illness 61yo F s/p L THA, AA. PMH: breast CA   Clinical Impression   Patient presents to OT with decreased ADL independence and safety due to the deficits listed below. She will benefit from skilled OT to maximize independence and to facilitate a safe discharge. OT will follow. Patient reports her husband can assist her with ADLs as needed.  BP sitting EOB 94/61; BP standing 80/53; BP after mobility 80/53. Patient symptomatic upon sitting EOB, but then no complaints of dizziness again during session.    Follow Up Recommendations  DC plan and follow up therapy as arranged by surgeon;No OT follow up;Supervision/Assistance - 24 hour    Equipment Recommendations  3 in 1 bedside commode    Recommendations for Other Services PT consult     Precautions / Restrictions Precautions Precautions: Fall Restrictions Weight Bearing Restrictions: No Other Position/Activity Restrictions: WBAT      Mobility Bed Mobility Overal bed mobility: Needs Assistance Bed Mobility: Supine to Sit     Supine to sit: Min assist;HOB elevated     General bed mobility comments: cues to technique, assist with LLE  Transfers Overall transfer level: Needs assistance Equipment used: Rolling walker (2 wheeled) Transfers: Sit to/from [facility] Sit to Stand: Mod assist;Min assist;From elevated surface Stand pivot transfers: Min assist       General transfer comment: cues for hand and leg placement, cues for technique    Balance                                           ADL either performed or assessed with clinical judgement   ADL Overall ADL's : Needs assistance/impaired Eating/Feeding: Independent;Sitting   Grooming: Brushing hair;Set up   Upper Body Bathing: Set up;Sitting   Lower Body Bathing: Minimal  assistance;Moderate assistance;Sit to/from stand   Upper Body Dressing : Set up;Sitting   Lower Body Dressing: Moderate assistance;Sit to/from stand   Toilet Transfer: Minimal assistance;Moderate assistance;Stand-pivot;Cueing for safety;Cueing for sequencing;BSC;RW   Toileting- Clothing Manipulation and Hygiene: Minimal assistance;Sit to/from stand       Functional mobility during ADLs: Minimal assistance;Moderate assistance;Cueing for safety;Cueing for sequencing;Rolling walker General ADL Comments: Patient wanted to try to have bowel movement. Up to [facility], then to recliner. Patient tolerated well. BP upon sitting 94/61 with patient reporting dizziness. BP in standing 80/53. At end of session BP 80/53. Patient not symptomatic at end of session.      Vision         Perception     Praxis      Pertinent Vitals/Pain Pain Assessment: 0-10 Pain Score: 8  Pain Location: L hip Pain Descriptors / Indicators: Aching;Burning;Sore Pain Intervention(s): Limited activity within patient's tolerance;Monitored during session;Premedicated before session;Repositioned;Ice applied     Hand Dominance     Extremity/Trunk Assessment Upper Extremity Assessment Upper Extremity Assessment: Overall WFL for tasks assessed   Lower Extremity Assessment Lower Extremity Assessment: Defer to PT evaluation   Cervical / Trunk Assessment Cervical / Trunk Assessment: Normal   Communication Communication Communication: No difficulties   Cognition Arousal/Alertness: Awake/alert Behavior During Therapy: WFL for tasks assessed/performed Overall Cognitive Status: Within Functional Limits for tasks assessed  General Comments       Exercises     Shoulder Instructions      Home Living Family/patient expects to be discharged to:: Private residence Living Arrangements: Spouse/significant other;Children Available Help at Discharge: Family Type of Home:  House Home Access: Stairs to enter Technical brewer of Steps: 2 Entrance Stairs-Rails: None Home Layout: Two level;1/2 bath on main level Alternate Level Stairs-Number of Steps: 13   Bathroom Shower/Tub: Occupational psychologist: Standard Bathroom Accessibility: Yes How Accessible: Accessible via walker Home Equipment: None          Prior Functioning/Environment Level of Independence: Independent                 OT Problem List: Decreased strength;Decreased activity tolerance;Decreased knowledge of use of DME or AE;Pain      OT Treatment/Interventions: Self-care/ADL training;DME and/or AE instruction;Therapeutic activities;Patient/family education    OT Goals(Current goals can be found in the care plan section) Acute Rehab OT Goals Patient Stated Goal: regain independence, decreased pain OT Goal Formulation: With patient Time For Goal Achievement: 12/16/17 Potential to Achieve Goals: Good ADL Goals Pt Will Perform Lower Body Bathing: with supervision;sit to/from stand Pt Will Perform Lower Body Dressing: with min assist;sit to/from stand Pt Will Transfer to Toilet: with supervision;ambulating;bedside commode Pt Will Perform Toileting - Clothing Manipulation and hygiene: with supervision;sit to/from stand Pt Will Perform Tub/Shower Transfer: Shower transfer;with min guard assist;ambulating;3 in 1;rolling walker  OT Frequency: Min 2X/week   Barriers to D/C:            Co-evaluation PT/OT/SLP Co-Evaluation/Treatment: Yes Reason for Co-Treatment: For patient/therapist safety PT goals addressed during session: Mobility/safety with mobility OT goals addressed during session: ADL's and self-care      AM-PAC PT "6 Clicks" Daily Activity     Outcome Measure Help from another person eating meals?: None Help from another person taking care of personal grooming?: None Help from another person toileting, which includes using toliet, bedpan, or urinal?: A  Little Help from another person bathing (including washing, rinsing, drying)?: A Lot Help from another person to put on and taking off regular upper body clothing?: None Help from another person to put on and taking off regular lower body clothing?: A Lot 6 Click Score: 19   End of Session Equipment Utilized During Treatment: Rolling walker Nurse Communication: Mobility status  Activity Tolerance: Patient tolerated treatment well Patient left: in chair;with call bell/phone within reach;with chair alarm set  OT Visit Diagnosis: Unsteadiness on feet (R26.81);Muscle weakness (generalized) (M62.81)                Time: 3220-2542 OT Time Calculation (min): 29 min Charges:  OT General Charges $OT Visit: 1 Visit OT Evaluation $OT Eval Low Complexity: 1 Low G-Codes:       Godson Pollan A Kainen Struckman Dec 27, 2017, 10:26 AM

## 2017-12-02 NOTE — Discharge Instructions (Signed)

## 2017-12-02 NOTE — Progress Notes (Signed)
Made Dr.Nitka aware of patients fever. No new orders.

## 2017-12-03 LAB — CBC
HCT: 24.9 % — ABNORMAL LOW (ref 36.0–46.0)
Hemoglobin: 8.5 g/dL — ABNORMAL LOW (ref 12.0–15.0)
MCH: 32.6 pg (ref 26.0–34.0)
MCHC: 34.1 g/dL (ref 30.0–36.0)
MCV: 95.4 fL (ref 78.0–100.0)
PLATELETS: 145 10*3/uL — AB (ref 150–400)
RBC: 2.61 MIL/uL — ABNORMAL LOW (ref 3.87–5.11)
RDW: 13 % (ref 11.5–15.5)
WBC: 5.9 10*3/uL (ref 4.0–10.5)

## 2017-12-03 NOTE — Progress Notes (Signed)
Physical Therapy Treatment Patient Details Name: Caroline [name] MRN: [id number] DOB: 07-10-1956 Today's Date: 12/03/2017    History of Present Illness 61yo F s/p L THA, AA. PMH: breast CA    PT Comments    Initiated stair training, will need to review again tomorrow as pt has 1 flight of stairs to get to her shower. Pt is progressing well with mobility, she walked 40' then performed THA exercises with min A.   Follow Up Recommendations  Home health PT;DC plan and follow up therapy as arranged by surgeon     Equipment Recommendations  Rolling walker with 5" wheels    Recommendations for Other Services       Precautions / Restrictions Precautions Precautions: Fall Restrictions Weight Bearing Restrictions: No Other Position/Activity Restrictions: WBAT    Mobility  Bed Mobility               General bed mobility comments: up in recliner   Transfers Overall transfer level: Needs assistance Equipment used: Rolling walker (2 wheeled) Transfers: Sit to/from Stand Sit to Stand: Min guard Stand pivot transfers: Min guard       General transfer comment: cues for hand and leg placement, cues for technique  Ambulation/Gait Ambulation/Gait assistance: Min guard Ambulation Distance (Feet): 35 Feet Assistive device: Rolling walker (2 wheeled) Gait Pattern/deviations: Step-to pattern;Decreased step length - right;Decreased step length - left;Decreased stance time - left Gait velocity: decreased   General Gait Details: VCs sequencing, no LOB   Stairs Stairs: Yes   Stair Management: No rails;Backwards;Step to pattern;With walker Number of Stairs: 2 General stair comments: VCs sequencing, Min A to manage/steady RW  Wheelchair Mobility    Modified Rankin (Stroke Patients Only)       Balance Overall balance assessment: Modified Independent                                          Cognition Arousal/Alertness: Awake/alert Behavior During  Therapy: WFL for tasks assessed/performed Overall Cognitive Status: Within Functional Limits for tasks assessed                                        Exercises Total Joint Exercises Ankle Circles/Pumps: AROM;Both;15 reps;Supine Quad Sets: AROM;Both;5 reps;Supine Short Arc Quad: AROM;Left;10 reps;Supine Heel Slides: AAROM;Left;10 reps;Supine Hip ABduction/ADduction: AAROM;Left;10 reps;Supine Long Arc Quad: AROM;Left;10 reps;Seated    General Comments        Pertinent Vitals/Pain Pain Assessment: Faces Pain Score: 7  Faces Pain Scale: Hurts little more Pain Location: L hip Pain Descriptors / Indicators: Aching;Burning;Sore Pain Intervention(s): Limited activity within patient's tolerance;Monitored during session;RN gave pain meds during session;Ice applied    Home Living                      Prior Function            PT Goals (current goals can now be found in the care plan section) Acute Rehab PT Goals Patient Stated Goal: regain independence, decreased pain PT Goal Formulation: With patient Time For Goal Achievement: 12/06/17 Potential to Achieve Goals: Good Progress towards PT goals: Progressing toward goals    Frequency    7X/week      PT Plan Current plan remains appropriate    Co-evaluation  AM-PAC PT "6 Clicks" Daily Activity  Outcome Measure  Difficulty turning over in bed (including adjusting bedclothes, sheets and blankets)?: Unable Difficulty moving from lying on back to sitting on the side of the bed? : Unable Difficulty sitting down on and standing up from a chair with arms (e.g., wheelchair, bedside commode, etc,.)?: Unable Help needed moving to and from a bed to chair (including a wheelchair)?: A Little Help needed walking in hospital room?: A Little Help needed climbing 3-5 steps with a railing? : A Little 6 Click Score: 12    End of Session Equipment Utilized During Treatment: Gait  belt Activity Tolerance: Patient tolerated treatment well Patient left: with call bell/phone within reach;in chair Nurse Communication: Mobility status PT Visit Diagnosis: Unsteadiness on feet (R26.81);Difficulty in walking, not elsewhere classified (R26.2);Pain Pain - Right/Left: Left Pain - part of body: Hip     Time: 1173-5670 PT Time Calculation (min) (ACUTE ONLY): 19 min  Charges:  $Gait Training: 8-22 mins $Therapeutic Exercise: 8-22 mins                    G Codes:          Philomena Doheny 12/03/2017, 11:53 AM [phone]

## 2017-12-03 NOTE — Op Note (Signed)
NAME:  Caroline [name], Caroline [name] NO.:  MEDICAL RECORD NO.:  [id number]  LOCATION:                                 FACILITY:  PHYSICIAN:  Lind Guest. Ninfa Linden, M.D.DATE OF BIRTH:  DATE OF PROCEDURE:  12/01/2017 DATE OF DISCHARGE:                              OPERATIVE REPORT   PREOPERATIVE DIAGNOSIS:  Primary osteoarthritis and degenerative joint disease of the left hip.  POSTOPERATIVE DIAGNOSIS:  Primary osteoarthritis and degenerative joint disease of the left hip.  PROCEDURE:  Left total hip arthroplasty through direct anterior approach.  IMPLANTS:  DePuy Sector Gription acetabular component size 50, size 32 +0 polyethylene liner, size 11 Corail femoral component with standard offset, size 32 +1 ceramic hip ball.  SURGEON:  Lind Guest. Ninfa Linden, M.D.  ASSISTANT:  Erskine Emery, P.A.  ANESTHESIA:  Spinal.  ANTIBIOTICS:  2 g of IV Ancef.  BLOOD LOSS:  250 to 300 mL.  COMPLICATIONS:  None.  INDICATIONS:  Ms. Jarnagin is a 61 year old female with debilitating arthritis involving her left hip.  This has been well documented.  This has been worsening for her in terms of pain.  It has detrimentally affected her activities of daily living, her quality of life, and her mobility.  She has tried and failed conservative treatment.  At this point, she does wish to proceed with a total hip arthroplasty.  She understands fully the risks of acute blood loss anemia, nerve and vessel injury, fracture, infection, dislocation, DVT.  She understands our goals are to decrease pain, improve mobility, and overall improve quality of life.  PROCEDURE DESCRIPTION:  After informed consent was obtained, appropriate left hip was marked.  She was brought to the operating room, where spinal anesthesia was obtained while she was on her stretcher.  A Foley catheter was then placed and she was placed supine on the Hana fracture table with the perineal post in place and both  legs in inline skeletal traction devices, but no traction applied.  Her left operative hip was prepped and draped with DuraPrep and sterile drapes.  A time-out was called and she was identified as correct patient and correct left hip. We then made incision just inferior and posterior to the anterior and superior iliac spine and carried this obliquely down the leg.  We dissected down the tensor fascia lata muscle and the tensor fascia was then divided longitudinally to proceed with a direct anterior approach to the hip.  We identified and cauterized the circumflex vessels and then identified the hip capsule.  I opened up the hip capsule in an L- type format, finding a large joint effusion and significant arthritis in her left hip.  I placed Cobra retractors around the medial and lateral femoral neck and then made our femoral neck cut with an oscillating saw proximal to the lesser trochanter and completed this with an osteotome. I placed a corkscrew guide in the femoral head and removed the femoral head in its entirety, found it to be devoid of cartilage.  We then placed a corkscrew guide in the femoral head and removed the femoral head again and found to be devoid of cartilage.  I placed a  bent Hohmann over the medial acetabular rim and then removed remnants of the acetabular labrum and other debris.  We then began reaming from a size 40, 2 reamers going up to a size 50, with all reamers under direct visualization, the last reamer under direct fluoroscopy, so I could obtain my depth of reaming, my inclination and anteversion.  Once I was pleased with this, I placed the real DePuy Sector Gription acetabular component size 50 and a 32 +0 neutral polyethylene liner for that size acetabular component.  Attention was then turned to the femur.  With the leg externally rotated to 120 degrees extended and adducted, I was able to place a Mueller retractor medially and a Hohmann retractor behind  the greater trochanter.  I released the lateral joint capsule and used a box cutting osteotome to enter the femoral canal and a rongeur to lateralize.  We then began broaching using the Corail broaching system, going from a size 8 up to a size 11.  The size 11 had a tight feel to this.  We trialed a standard offset femoral neck and a 32 +1 hip ball. We rolled the leg back over and up with traction and internal rotation, reducing the pelvis.  I was pleased with the feel of this with her Shuck range of motion and stability.  On x-rays, it feels like she is a little bit long, but I am not trusting the x-ray for leg length based on what I am feeling on exam.  I did not feel comfortable with her offset going with a varus offset neck.  We had already medialized the cup and every other aspect to shorten the hip.  I dislocated the hip and then removed the trial components.  I placed the real Corail femoral component size 11 with standard offset and the real 32 +1 ceramic hip ball and reduced this in the acetabulum and again I was pleased with the range of motion and stability.  We then irrigated the soft tissue with normal saline solution using pulsatile lavage.  I closed the joint capsule with interrupted #1 Ethibond suture followed by running #1 Vicryl in the tensor fascia, 0 Vicryl in the deep tissue, 2-0 Vicryl in the subcutaneous tissue, 4-0 Monocryl subcuticular stitch and Steri-Strips on the skin.  An Aquacel dressing was applied.  She was taken off the Hana table and taken to the recovery room in stable condition.  All final counts were correct.  There were no complications noted.     Lind Guest. Ninfa Linden, M.D.     CYB/MEDQ  D:  12/01/2017  T:  12/02/2017  Job:  [id number]

## 2017-12-03 NOTE — Progress Notes (Signed)
Physical Therapy Treatment Patient Details Name: Caroline [name] MRN: [id number] DOB: 1956/09/01 Today's Date: 12/03/2017    History of Present Illness 61yo F s/p L THA, AA. PMH: breast CA    PT Comments    Pt tolerated increased ambulation distance of 62' with RW, performed THA exercises with min assist.  Will do stair training next session today.   Follow Up Recommendations  Home health PT;DC plan and follow up therapy as arranged by surgeon     Equipment Recommendations  Rolling walker with 5" wheels    Recommendations for Other Services       Precautions / Restrictions Precautions Precautions: Fall Restrictions Weight Bearing Restrictions: No Other Position/Activity Restrictions: WBAT    Mobility  Bed Mobility               General bed mobility comments: up in recliner  Transfers Overall transfer level: Needs assistance Equipment used: Rolling walker (2 wheeled) Transfers: Sit to/from Stand Sit to Stand: Min guard         General transfer comment: cues for hand and leg placement, cues for technique  Ambulation/Gait Ambulation/Gait assistance: Min guard Ambulation Distance (Feet): 80 Feet Assistive device: Rolling walker (2 wheeled) Gait Pattern/deviations: Step-to pattern;Decreased step length - right;Decreased step length - left;Decreased stance time - left     General Gait Details: VCs sequencing, no LOB   Stairs            Wheelchair Mobility    Modified Rankin (Stroke Patients Only)       Balance Overall balance assessment: Modified Independent                                          Cognition Arousal/Alertness: Awake/alert Behavior During Therapy: WFL for tasks assessed/performed Overall Cognitive Status: Within Functional Limits for tasks assessed                                        Exercises Total Joint Exercises Ankle Circles/Pumps: AROM;Both;15 reps;Supine Short Arc Quad:  AROM;Left;10 reps;Supine Heel Slides: AAROM;Left;10 reps;Supine Hip ABduction/ADduction: AAROM;Left;10 reps;Supine    General Comments        Pertinent Vitals/Pain Pain Score: 7  Pain Location: L hip Pain Descriptors / Indicators: Aching;Burning;Sore Pain Intervention(s): Limited activity within patient's tolerance;Premedicated before session;Monitored during session;Ice applied    Home Living                      Prior Function            PT Goals (current goals can now be found in the care plan section) Acute Rehab PT Goals Patient Stated Goal: regain independence, decreased pain PT Goal Formulation: With patient Time For Goal Achievement: 12/06/17 Potential to Achieve Goals: Good Progress towards PT goals: Progressing toward goals    Frequency    7X/week      PT Plan Current plan remains appropriate    Co-evaluation              AM-PAC PT "6 Clicks" Daily Activity  Outcome Measure  Difficulty turning over in bed (including adjusting bedclothes, sheets and blankets)?: Unable Difficulty moving from lying on back to sitting on the side of the bed? : Unable Difficulty sitting down on and standing up from a chair  with arms (e.g., wheelchair, bedside commode, etc,.)?: Unable Help needed moving to and from a bed to chair (including a wheelchair)?: A Little Help needed walking in hospital room?: A Little Help needed climbing 3-5 steps with a railing? : A Lot 6 Click Score: 11    End of Session Equipment Utilized During Treatment: Gait belt Activity Tolerance: Patient tolerated treatment well Patient left: with call bell/phone within reach;in chair Nurse Communication: Mobility status PT Visit Diagnosis: Unsteadiness on feet (R26.81);Difficulty in walking, not elsewhere classified (R26.2);Pain Pain - Right/Left: Left Pain - part of body: Hip     Time: 5498-2641 PT Time Calculation (min) (ACUTE ONLY): 25 min  Charges:  $Gait Training: 8-22  mins $Therapeutic Exercise: 8-22 mins                    G Codes:          Philomena Doheny 12/03/2017, 9:49 AM [phone]

## 2017-12-03 NOTE — Progress Notes (Signed)
Pt took 10 mg oxycodone at 1952, then decided she needed the additional 5 mg ordered at 2044. Order in pyxis does not allow 5 mg to be taken out. Removed 10 mg from pyxis, gave 5 mg to patient, and wasted additional 5 mg tablet in sharps container in med room with [employer].

## 2017-12-03 NOTE — Progress Notes (Signed)
Occupational Therapy Treatment Patient Details Name: Caroline [name] MRN: [id number] DOB: 08-Aug-1956 Today's Date: 12/03/2017    History of present illness 61yo F s/p L THA, AA. PMH: breast CA   OT comments  Pt is making excellent progress.  She requires assist for LB ADLs due to Lt hip pain - spouse can assist her at home.  She requires min guard assist for functional transfers.   Follow Up Recommendations  DC plan and follow up therapy as arranged by surgeon;No OT follow up;Supervision/Assistance - 24 hour    Equipment Recommendations  3 in 1 bedside commode    Recommendations for Other Services      Precautions / Restrictions Precautions Precautions: Fall Restrictions Weight Bearing Restrictions: No Other Position/Activity Restrictions: WBAT       Mobility Bed Mobility               General bed mobility comments: up in recliner   Transfers Overall transfer level: Needs assistance Equipment used: Rolling walker (2 wheeled) Transfers: Sit to/from Stand Sit to Stand: Min guard Stand pivot transfers: Min guard       General transfer comment: cues for hand and leg placement, cues for technique    Balance Overall balance assessment: Modified Independent                                         ADL either performed or assessed with clinical judgement   ADL Overall ADL's : Needs assistance/impaired     Grooming: Wash/dry hands;Wash/dry face;Oral care;Brushing hair;Supervision/safety;Standing       Lower Body Bathing: Minimal assistance;Sit to/from stand       Lower Body Dressing: Moderate assistance;Sit to/from stand   Toilet Transfer: Min guard;Ambulation;Regular Toilet;Grab bars;RW   Toileting- Water quality scientist and Hygiene: Min guard;Sit to/from stand       Functional mobility during ADLs: Min guard;Rolling walker General ADL Comments: Pt unable to access Lt foot for ADLs.  She was instructed to attempt daily before asking  spouse to assist her.  Discussed use of reacher.   walk in Shower is upstairs, so she will sponge bathe initially       Vision       Perception     Praxis      Cognition Arousal/Alertness: Awake/alert Behavior During Therapy: WFL for tasks assessed/performed Overall Cognitive Status: Within Functional Limits for tasks assessed                                          Exercises Total Joint Exercises Ankle Circles/Pumps: AROM;Both;15 reps;Supine Short Arc Quad: AROM;Left;10 reps;Supine Heel Slides: AAROM;Left;10 reps;Supine Hip ABduction/ADduction: AAROM;Left;10 reps;Supine   Shoulder Instructions       General Comments      Pertinent Vitals/ Pain       Pain Assessment: Faces Pain Score: 7  Faces Pain Scale: Hurts little more Pain Location: L hip Pain Descriptors / Indicators: Aching;Burning;Sore Pain Intervention(s): Monitored during session  Home Living                                          Prior Functioning/Environment              Frequency  Min 2X/week        Progress Toward Goals  OT Goals(current goals can now be found in the care plan section)  Progress towards OT goals: Progressing toward goals  Acute Rehab OT Goals Patient Stated Goal: regain independence, decreased pain  Plan Discharge plan remains appropriate    Co-evaluation                 AM-PAC PT "6 Clicks" Daily Activity     Outcome Measure   Help from another person eating meals?: None Help from another person taking care of personal grooming?: A Little Help from another person toileting, which includes using toliet, bedpan, or urinal?: A Little Help from another person bathing (including washing, rinsing, drying)?: A Little Help from another person to put on and taking off regular upper body clothing?: A Little Help from another person to put on and taking off regular lower body clothing?: A Lot 6 Click Score: 18    End of  Session Equipment Utilized During Treatment: Rolling walker  OT Visit Diagnosis: Unsteadiness on feet (R26.81);Muscle weakness (generalized) (M62.81)   Activity Tolerance Patient tolerated treatment well   Patient Left in chair;with call bell/phone within reach   Nurse Communication Mobility status        Time: 0034-9179 OT Time Calculation (min): 28 min  Charges: OT General Charges $OT Visit: 1 Visit OT Treatments $Self Care/Home Management : 23-37 mins  [facility], OTR/L [phone]    Lucille Passy M 12/03/2017, 10:34 AM

## 2017-12-04 LAB — CBC
HEMATOCRIT: 23.4 % — AB (ref 36.0–46.0)
HEMOGLOBIN: 8 g/dL — AB (ref 12.0–15.0)
MCH: 32.4 pg (ref 26.0–34.0)
MCHC: 34.2 g/dL (ref 30.0–36.0)
MCV: 94.7 fL (ref 78.0–100.0)
PLATELETS: 144 10*3/uL — AB (ref 150–400)
RBC: 2.47 MIL/uL — ABNORMAL LOW (ref 3.87–5.11)
RDW: 12.9 % (ref 11.5–15.5)
WBC: 5.6 10*3/uL (ref 4.0–10.5)

## 2017-12-04 NOTE — Progress Notes (Signed)
Patient ID: Caroline [name], female   DOB: March 28, 1956, 61 y.o.   MRN: [id number] Still some issues with pain control, but overall doing well.  Hgb 8.0, but tolerating well and denies light-headedness.  Left hip stable.  Can discharge to home this afternoon.

## 2017-12-04 NOTE — Progress Notes (Signed)
Physical Therapy Evaluation Patient Details Name: RONALD VINSANT MRN: [id number] DOB: [birth year] Today's Date: 12/04/2017   History of Present Illness  61yo F s/p L THA, AA. PMH: breast CA  Clinical Impression  Progressing with mobility. Reviewed/practiced gait training and stair training. Pt wanted to practice ascending/descending multiple steps to simulate getting to/from bathroom/bedroom. Pt has HEP-recommended she perform 2x/day. All education completed. Okay to d/c from PT standpoint-made RN aware.     Follow Up Recommendations DC plan and follow up therapy as arranged by surgeon    Equipment Recommendations  Rolling walker with 5" wheels    Recommendations for Other Services       Precautions / Restrictions Precautions Precautions: Fall Restrictions Weight Bearing Restrictions: No Other Position/Activity Restrictions: WBAT      Mobility  Bed Mobility Overal bed mobility: Needs Assistance Bed Mobility: Sit to Supine         Sit to sidelying: Supervision General bed mobility comments: Pt hooked L ankle with R foot to get into bed  Transfers Overall transfer level: Needs assistance Equipment used: Rolling walker (2 wheeled) Transfers: Sit to/from Stand Sit to Stand: Supervision         General transfer comment: for safety.   Ambulation/Gait Ambulation/Gait assistance: Min guard Ambulation Distance (Feet): 70 Feet Assistive device: Rolling walker (2 wheeled) Gait Pattern/deviations: Step-to pattern;Step-through pattern;Decreased stride length     General Gait Details: slow gait speed. close guard for safety.   Stairs Stairs: Yes Stairs assistance: Min guard Stair Management: One rail Left;One rail Right;Two rails;Sideways   General stair comments: close guard for safety. VCs safety, sequence, technique. Husband present during session.   Wheelchair Mobility    Modified Rankin (Stroke Patients Only)       Balance                                              Pertinent Vitals/Pain Pain Assessment: 0-10 Pain Score: 6  Pain Location: L hip Pain Descriptors / Indicators: Aching;Burning;Sore Pain Intervention(s): Monitored during session;Repositioned    Home Living                        Prior Function                 Hand Dominance        Extremity/Trunk Assessment                Communication      Cognition Arousal/Alertness: Awake/alert Behavior During Therapy: WFL for tasks assessed/performed Overall Cognitive Status: Within Functional Limits for tasks assessed                                        General Comments      Exercises     Assessment/Plan    PT Assessment    PT Problem List         PT Treatment Interventions      PT Goals (Current goals can be found in the Care Plan section)       Frequency 7X/week   Barriers to discharge        Co-evaluation               AM-PAC PT "6 Clicks" Daily Activity  Outcome Measure Difficulty turning over in bed (including adjusting bedclothes, sheets and blankets)?: A Little Difficulty moving from lying on back to sitting on the side of the bed? : A Little Difficulty sitting down on and standing up from a chair with arms (e.g., wheelchair, bedside commode, etc,.)?: A Little Help needed moving to and from a bed to chair (including a wheelchair)?: A Little Help needed walking in hospital room?: A Little Help needed climbing 3-5 steps with a railing? : A Little 6 Click Score: 18    End of Session Equipment Utilized During Treatment: Gait belt Activity Tolerance: Patient tolerated treatment well Patient left: in bed;with call bell/phone within reach;with family/visitor present   PT Visit Diagnosis: Muscle weakness (generalized) (M62.81);Difficulty in walking, not elsewhere classified (R26.2) Pain - Right/Left: Left Pain - part of body: Hip    Time: 5573-2202 PT Time Calculation  (min) (ACUTE ONLY): 17 min   Charges:     PT Treatments $Gait Training: 8-22 mins   PT G Codes:          Weston Anna, MPT Pager: [phone]

## 2017-12-04 NOTE — Progress Notes (Signed)
PT Cancellation Note  Patient Details Name: ZAURIA DOMBEK MRN: [id number] DOB: [birth year]   Cancelled Treatment:    Reason Eval/Treat Not Completed: Attempted PT tx session. Pt declined to participate at this time. She is waiting on pain meds and a shower. Will check back as schedule permits.    Weston Anna, MPT Pager: [phone]

## 2017-12-04 NOTE — Progress Notes (Signed)
Discharge planning, nurse spoke with patient and spouse at bedside. Have chosen [facility] for [facility] PT. Contacted [facility] for referral. Needs RW and 3n1, contacted [facility] to deliver to room. [phone]

## 2017-12-04 NOTE — Discharge Summary (Signed)
Patient ID: Caroline [name] MRN: [id number] DOB/AGE: [birth year] 61 y.o.  Admit date: 12/01/2017 Discharge date: 12/04/2017  Admission Diagnoses:  Principal Problem:   Status post total replacement of left hip   Discharge Diagnoses:  Same  Past Medical History:  Diagnosis Date  . Arthritis    hips  . Cancer ([place])    Left breast  . History of kidney stones   . Hypertension   . Paroxysmal atrial fibrillation ([facility])   . S/P radiation therapy     Surgeries: Procedure(s): LEFT TOTAL HIP ARTHROPLASTY ANTERIOR APPROACH on 12/01/2017   Consultants:   Discharged Condition: Improved  Hospital Course: Caroline [name] is an 61 y.o. female who was admitted 12/01/2017 for operative treatment ofStatus post total replacement of left hip. Patient has severe unremitting pain that affects sleep, daily activities, and work/hobbies. After pre-op clearance the patient was taken to the operating room on 12/01/2017 and underwent  Procedure(s): LEFT TOTAL HIP ARTHROPLASTY ANTERIOR APPROACH.    Patient was given perioperative antibiotics:  Anti-infectives (From admission, onward)   Start     Dose/Rate Route Frequency Ordered Stop   12/01/17 1400  ceFAZolin (ANCEF) IVPB 1 g/50 mL premix     1 g 100 mL/hr over 30 Minutes Intravenous Every 6 hours 12/01/17 1133 12/01/17 2143   12/01/17 0625  ceFAZolin (ANCEF) 2-4 GM/100ML-% IVPB    Comments:  Bridget Hartshorn   : cabinet override      12/01/17 0625 12/01/17 0730   12/01/17 0552  ceFAZolin (ANCEF) IVPB 2g/100 mL premix     2 g 200 mL/hr over 30 Minutes Intravenous On call to O.R. 12/01/17 5427 12/01/17 0740       Patient was given sequential compression devices, early ambulation, and chemoprophylaxis to prevent DVT.  Patient benefited maximally from hospital stay and there were no complications.    Recent vital signs:  Patient Vitals for the past 24 hrs:  BP Temp Temp src Pulse Resp SpO2  12/04/17 0529 98/61 99 F (37.2 C) Oral (!) 106 18  95 %  12/03/17 2008 93/61 99.8 F (37.7 C) Oral 100 16 99 %  12/03/17 1723 - (!) 100.4 F (38 C) - - - -  12/03/17 1625 - (!) 102.8 F (39.3 C) - - - -  12/03/17 1624 - (!) 102.8 F (39.3 C) - - - -  12/03/17 1435 (!) 110/54 98.3 F (36.8 C) Oral 92 16 96 %     Recent laboratory studies:  Recent Labs    12/02/17 0515 12/03/17 0516 12/04/17 0452  WBC 5.6 5.9 5.6  HGB 10.0* 8.5* 8.0*  HCT 30.1* 24.9* 23.4*  PLT 172 145* 144*  NA 132*  --   --   K 3.8  --   --   CL 98*  --   --   CO2 27  --   --   BUN 14  --   --   CREATININE 0.86  --   --   GLUCOSE 94  --   --   CALCIUM 8.3*  --   --      Discharge Medications:   Allergies as of 12/04/2017      Reactions   Sulfa Antibiotics Other (See Comments)   Unknown, was child      Medication List    TAKE these medications   buPROPion 150 MG 24 hr tablet Commonly known as:  WELLBUTRIN XL Take 150 mg by mouth daily.   ELIQUIS 5 MG Tabs tablet  Generic drug:  apixaban TAKE 1 TABLET TWICE A DAY (NEED APPOINTMENT FOR REFILLS [phone]) What changed:  See the new instructions.   fenofibrate 160 MG tablet Take 160 mg by mouth daily.   gabapentin 100 MG capsule Commonly known as:  NEURONTIN Take 1 capsule (100 mg total) by mouth 3 (three) times daily.   HYDROcodone-acetaminophen 5-325 MG tablet Commonly known as:  NORCO/VICODIN Take 1 tablet by mouth 2 (two) times daily as needed for moderate pain.   methocarbamol 500 MG tablet Commonly known as:  ROBAXIN Take 1 tablet (500 mg total) by mouth every 6 (six) hours as needed for muscle spasms.   metoprolol succinate 25 MG 24 hr tablet Commonly known as:  TOPROL-XL TAKE 1 TABLET DAILY What changed:    how much to take  how to take this  when to take this   oxyCODONE-acetaminophen 5-325 MG tablet Commonly known as:  ROXICET Take 1-2 tablets by mouth every 4 (four) hours as needed.   valsartan-hydrochlorothiazide 160-12.5 MG tablet Commonly known as:   DIOVAN-HCT Take 1 tablet by mouth daily.     ASK your doctor about these medications   tamoxifen 20 MG tablet Commonly known as:  NOLVADEX Take 1 tablet (20 mg total) daily by mouth. Ask about: Should I take this medication?            Durable Medical Equipment  (From admission, onward)        Start     Ordered   12/01/17 1134  DME 3 n 1  Once     12/01/17 1133   12/01/17 1134  DME Walker rolling  Once    Question:  Patient needs a walker to treat with the following condition  Answer:  Status post total replacement of left hip   12/01/17 1133      Diagnostic Studies: Dg Pelvis Portable  Result Date: 12/01/2017 CLINICAL DATA:  Post left total hip replacement EXAM: PORTABLE PELVIS 1-2 VIEWS COMPARISON:  10/05/2017 FINDINGS: Changes of left hip replacement. Normal AP alignment. No hardware or bony complicating feature. IMPRESSION: Left hip replacement.  No complicating feature. Electronically Signed   By: Rolm Baptise M.D.   On: 12/01/2017 10:25   Dg C-arm 1-60 Min-no Report  Result Date: 12/01/2017 Fluoroscopy was utilized by the requesting physician.  No radiographic interpretation.   Dg Hip Operative Unilat With Pelvis Left  Result Date: 12/01/2017 CLINICAL DATA:  Left hip replacement EXAM: OPERATIVE left HIP (WITH PELVIS IF PERFORMED) 5 VIEWS TECHNIQUE: Fluoroscopic spot image(s) were submitted for interpretation post-operatively. COMPARISON:  10/05/2017 FINDINGS: Left hip replacement satisfactory position alignment. No immediate complication IMPRESSION: Satisfactory left hip replacement Electronically Signed   By: Franchot Gallo M.D.   On: 12/01/2017 09:01    Disposition: 01-Home or Self Care  Discharge Instructions    Discharge patient   Complete by:  As directed    Discharge in the afternoon after PT.   Discharge disposition:  01-Home or Self Care   Discharge patient date:  12/04/2017      Follow-up Information    Mcarthur Rossetti, MD Follow up in 2  week(s).   Specialty:  Orthopedic Surgery Contact information: [place] [address] [phone]            Signed: Mcarthur Rossetti 12/04/2017, 8:33 AM

## 2017-12-04 NOTE — Progress Notes (Signed)
Discharge and medication instructions reviewed with patient and spouse. Questions answered and both deny further questions. Patient given her prescriptions. Family member is driving patient home. Donne Hazel, RN

## 2017-12-14 ENCOUNTER — Ambulatory Visit (INDEPENDENT_AMBULATORY_CARE_PROVIDER_SITE_OTHER): Payer: 59 | Admitting: Orthopaedic Surgery

## 2017-12-14 ENCOUNTER — Encounter (INDEPENDENT_AMBULATORY_CARE_PROVIDER_SITE_OTHER): Payer: Self-pay | Admitting: Orthopaedic Surgery

## 2017-12-14 DIAGNOSIS — Z96642 Presence of left artificial hip joint: Secondary | ICD-10-CM

## 2017-12-14 MED ORDER — OXYCODONE-ACETAMINOPHEN 5-325 MG PO TABS: 1.0000 | ORAL_TABLET | Freq: Four times a day (QID) | ORAL | 0 refills | Status: DC | PRN

## 2017-12-14 NOTE — Progress Notes (Signed)
The patient is 2 weeks tomorrow status post a left total hip arthroplasty directed approach.  She says she is doing well.  She is able with a cane.  Her pain is moderate.  She is enjoyed outpatient physical therapy.  On examination we remove the old Steri-Strips and dressing in place new Steri-Strips.  She did have she did have a small seroma but I was able to drain 10 cc from this area.  There is no evidence of infection.  Her leg lengths are equal.  At this point we did give her a refill of of her Percocet.  All questions concerns were answered and addressed.  She will continue increase her activities as comfort allows.  We will see her back in 4 weeks see how she is doing overall.

## 2017-12-15 ENCOUNTER — Telehealth (INDEPENDENT_AMBULATORY_CARE_PROVIDER_SITE_OTHER): Payer: Self-pay | Admitting: Orthopaedic Surgery

## 2017-12-15 NOTE — Telephone Encounter (Signed)
Patient would like to know if she still needs to wear the compression socks, please advise - [phone]

## 2017-12-15 NOTE — Telephone Encounter (Signed)
L THA on 12/07 do you still want her wearing compression hose? Or just as needed for swelling?

## 2017-12-15 NOTE — Telephone Encounter (Signed)
Wear only as needed for swelling

## 2017-12-15 NOTE — Telephone Encounter (Signed)
IC advised.  

## 2017-12-29 ENCOUNTER — Encounter: Payer: Self-pay | Admitting: Radiation Oncology

## 2018-01-01 ENCOUNTER — Other Ambulatory Visit (HOSPITAL_COMMUNITY): Payer: Self-pay | Admitting: *Deleted

## 2018-01-01 MED ORDER — APIXABAN 5 MG PO TABS: ORAL_TABLET | ORAL | 0 refills | Status: DC

## 2018-01-02 ENCOUNTER — Telehealth (INDEPENDENT_AMBULATORY_CARE_PROVIDER_SITE_OTHER): Payer: Self-pay | Admitting: Orthopaedic Surgery

## 2018-01-02 MED ORDER — HYDROCODONE-ACETAMINOPHEN 5-325 MG PO TABS: 1.0000 | ORAL_TABLET | Freq: Four times a day (QID) | ORAL | 0 refills | Status: DC | PRN

## 2018-01-02 NOTE — Telephone Encounter (Signed)
Patient called needing Rx refilled (Hydrocodone) The number to contact patient is [phone]

## 2018-01-02 NOTE — Telephone Encounter (Signed)
Please advise 

## 2018-01-02 NOTE — Telephone Encounter (Signed)
Can come and pick up script 

## 2018-01-02 NOTE — Telephone Encounter (Signed)
Patient aware Rx ready at front desk  

## 2018-01-04 ENCOUNTER — Ambulatory Visit (INDEPENDENT_AMBULATORY_CARE_PROVIDER_SITE_OTHER): Payer: 59 | Admitting: Physician Assistant

## 2018-01-04 ENCOUNTER — Encounter (INDEPENDENT_AMBULATORY_CARE_PROVIDER_SITE_OTHER): Payer: Self-pay | Admitting: Physician Assistant

## 2018-01-04 DIAGNOSIS — Z96642 Presence of left artificial hip joint: Secondary | ICD-10-CM

## 2018-01-04 NOTE — Progress Notes (Signed)
Mrs. Gerwig returns today status post left total hip arthroplasty 12/01/2017.  She overall states that her preop pain is gone.  She does not have pain mainly lateral aspect hip some groin pain but nothing like she is having preoperative.  She is ambulating without any assistive device.  Taking hydrocodone for pain.  No shortness of breath fevers chills.  Physical exam :General well-developed well-nourished female no acute distress mood and affect appropriate.  Left hand she has somewhat limited internal/external rotation.  Tenderness over the greater trochanteric region.  Calf supple nontender.  She ambulates without any assistive device and just a slight antalgic gait  Impression: Status post left total hip arthroplasty  Plan: Recommend she try IT band stretching exercises shown today had her demonstrate.  Moist heat to the site of the hip.  She will follow-up with [place] in 1 month check progress lack of.  Encouragement is given that this is normal.  She is unable to take NSAIDs due to slightly elevated creatinine level.

## 2018-01-05 ENCOUNTER — Ambulatory Visit
Admission: RE | Admit: 2018-01-05 | Discharge: 2018-01-05 | Disposition: A | Discharge status: Home / Self Care | Payer: 59 | Source: Ambulatory Visit | Attending: Radiation Oncology | Admitting: Radiation Oncology

## 2018-01-05 ENCOUNTER — Encounter: Payer: Self-pay | Admitting: Radiation Oncology

## 2018-01-05 VITALS — BP 99/77 | HR 84 | Temp 98.3°F | Ht 64.0 in | Wt 167.8 lb

## 2018-01-05 DIAGNOSIS — Z79899 Other long term (current) drug therapy: Secondary | ICD-10-CM | POA: Insufficient documentation

## 2018-01-05 DIAGNOSIS — Z170 Estrogen receptor positive status [ER+]: Secondary | ICD-10-CM

## 2018-01-05 DIAGNOSIS — C50511 Malignant neoplasm of lower-outer quadrant of right female breast: Secondary | ICD-10-CM | POA: Diagnosis not present

## 2018-01-05 NOTE — Progress Notes (Signed)
  Radiation Oncology         [phone] ________________________________  Name: Caroline [name] MRN: [id number]  Date: 01/05/2018  DOB: 01-23-1956  Follow-Up Visit Note  Outpatient  CC: Epimenio Foot, MD  Diagnosis:      ICD-10-CM   1. 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   CHIEF COMPLAINT: Here to discuss management of right breast cancer  Narrative:  The patient returns today for follow-up.     Since consultation, she underwent left total hip arthroplasty on 12/01/2017. She has not been applying lotion to breast.  On Tamoxifen with med onc f/u scheduled.           ALLERGIES:  is allergic to sulfa antibiotics.  Meds: Current Outpatient Medications  Medication Sig Dispense Refill  . apixaban (ELIQUIS) 5 MG TABS tablet TAKE 1 TABLET TWICE A DAY (NEED APPOINTMENT FOR REFILLS [phone]) 180 tablet 0  . buPROPion (WELLBUTRIN XL) 150 MG 24 hr tablet Take 150 mg by mouth daily.    . fenofibrate 160 MG tablet Take 160 mg by mouth daily.    Marland Kitchen HYDROcodone-acetaminophen (NORCO/VICODIN) 5-325 MG tablet Take 1-2 tablets by mouth every 6 (six) hours as needed for moderate pain. 60 tablet 0  . metoprolol succinate (TOPROL-XL) 25 MG 24 hr tablet TAKE 1 TABLET DAILY (Patient taking differently: Take 25 mg by mouth at bedtime) 90 tablet 2  . tamoxifen (NOLVADEX) 20 MG tablet Take 20 mg by mouth daily.    . valsartan-hydrochlorothiazide (DIOVAN-HCT) 160-12.5 MG tablet Take 1 tablet by mouth daily.    . methocarbamol (ROBAXIN) 500 MG tablet Take 1 tablet (500 mg total) by mouth every 6 (six) hours as needed for muscle spasms. (Patient not taking: Reported on 01/05/2018) 60 tablet 0   No current facility-administered medications for this encounter.     Physical Findings:  height is 5\' 4"  (1.626 m) and  weight is 167 lb 12.8 oz (76.1 kg). Her temperature is 98.3 F (36.8 C). Her blood pressure is 99/77 and her pulse is 84. Her oxygen saturation is 98%. .     General: Alert and oriented, in no acute distress Psychiatric: Judgment and insight are intact. Affect is appropriate. Breast exam reveals hyperpigmentation, dryness, right breast  Lab Findings: Lab Results  Component Value Date   WBC 5.6 12/04/2017   HGB 8.0 (L) 12/04/2017   HCT 23.4 (L) 12/04/2017   MCV 94.7 12/04/2017   PLT 144 (L) 12/04/2017    Radiographic Findings: No results found.  Impression/Plan: right breast cancer, recovering well overall.    Advised to apply Vit E oil BID to right breast x 2-3 month to heal skin.  I encouraged her to continue with yearly mammography and followup with medical oncology. I will see her back on an as-needed basis. I have encouraged her to call if she has any issues or concerns in the future. I wished her the very best.     _____________________________________   Eppie Gibson, MD This document serves as a record of services personally performed by Eppie Gibson, MD. It was created on her behalf by Valeta Harms, a trained medical scribe. The creation of this record is based on the scribe's personal observations and the provider's statements to them. This document has been checked and approved by the attending provider.

## 2018-01-05 NOTE — Progress Notes (Signed)
Ms. Qazi presents for follow up of radiation completed 11/22/17 to her Right Breast. She had a Left hip replacement on 12/01/17 and is recovering well. She rates her pain a 5/10 to her left hip. She reports that her skin has healed well. She has some tenderness below her incision. She is not using any special creams to her Right Breast. She was advised to use a vitamin E oil or lotion to her Right Breast. She is taking tamoxifen without any problems noted.   BP 99/77   Pulse 84   Temp 98.3 F (36.8 C)   Ht 5\' 4"  (1.626 m)   Wt 167 lb 12.8 oz (76.1 kg)   LMP 11/25/2010   SpO2 98% Comment: room air  BMI 28.80 kg/m    Wt Readings from Last 3 Encounters:  01/05/18 167 lb 12.8 oz (76.1 kg)  12/01/17 166 lb (75.3 kg)  11/27/17 166 lb 8 oz (75.5 kg)

## 2018-01-09 ENCOUNTER — Other Ambulatory Visit: Payer: Self-pay

## 2018-01-09 ENCOUNTER — Encounter (HOSPITAL_COMMUNITY): Payer: Self-pay | Admitting: Nurse Practitioner

## 2018-01-09 ENCOUNTER — Ambulatory Visit (HOSPITAL_COMMUNITY)
Admission: RE | Admit: 2018-01-09 | Discharge: 2018-01-09 | Disposition: A | Discharge status: Home / Self Care | Payer: 59 | Source: Ambulatory Visit | Attending: Nurse Practitioner | Admitting: Nurse Practitioner

## 2018-01-09 VITALS — BP 108/64 | HR 88 | Ht 64.0 in | Wt 171.0 lb

## 2018-01-09 DIAGNOSIS — Z79899 Other long term (current) drug therapy: Secondary | ICD-10-CM | POA: Insufficient documentation

## 2018-01-09 DIAGNOSIS — F17210 Nicotine dependence, cigarettes, uncomplicated: Secondary | ICD-10-CM | POA: Diagnosis not present

## 2018-01-09 DIAGNOSIS — Z853 Personal history of malignant neoplasm of breast: Secondary | ICD-10-CM | POA: Diagnosis not present

## 2018-01-09 DIAGNOSIS — Z87442 Personal history of urinary calculi: Secondary | ICD-10-CM | POA: Insufficient documentation

## 2018-01-09 DIAGNOSIS — Z170 Estrogen receptor positive status [ER+]: Secondary | ICD-10-CM

## 2018-01-09 DIAGNOSIS — C50511 Malignant neoplasm of lower-outer quadrant of right female breast: Secondary | ICD-10-CM

## 2018-01-09 DIAGNOSIS — I480 Paroxysmal atrial fibrillation: Secondary | ICD-10-CM | POA: Insufficient documentation

## 2018-01-09 DIAGNOSIS — I10 Essential (primary) hypertension: Secondary | ICD-10-CM | POA: Insufficient documentation

## 2018-01-09 DIAGNOSIS — M160 Bilateral primary osteoarthritis of hip: Secondary | ICD-10-CM | POA: Insufficient documentation

## 2018-01-09 MED ORDER — METOPROLOL SUCCINATE ER 25 MG PO TB24: 12.5000 mg | ORAL_TABLET | Freq: Every day | ORAL | 2 refills | Status: DC

## 2018-01-09 NOTE — Patient Instructions (Signed)
Decrease metoprolol to 1/2 tablet a day  Follow up in 1 year

## 2018-01-09 NOTE — Progress Notes (Signed)
[place]  [phone] [phone]     ID: Caroline [name] DOB: Aug 14, 1956  MR#: [id number]  GYI#:[id number]  Patient Care Team: Selinda Orion as PCP - General (Physician Assistant) Jusitn Salsgiver, Virgie Dad, MD as Consulting Physician (Oncology) Erroll Luna, MD as Consulting Physician (General Surgery) Leo Grosser, Seymour Bars, MD as Consulting Physician (Obstetrics and Gynecology) Donzetta Sprung., MD as Physician Assistant (Sports Medicine) Eppie Gibson, MD as Attending Physician (Radiation Oncology) Juanita Craver, MD as Consulting Physician (Gastroenterology) Thompson Grayer, MD as Consulting Physician (Cardiology) Mcarthur Rossetti, MD as Consulting Physician (Orthopedic Surgery) Shawnie Dapper OTHER MD:  CHIEF COMPLAINT: Estrogen receptor positive breast cancer  CURRENT TREATMENT: tamoxifen  HISTORY OF CURRENT ILLNESS: From the original intake note:  Caroline [name] (last name is pronounced muh-[id number]) had routine screening mammography with tomography at [facility] 07/21/2017, showing the breast density to be category C. An area of possible distortion was noted in the right breast, and on 07/28/2017 she underwent right diagnostic mammography with tomography and right breast ultrasonography. This confirmed an area of distortion in the outer right breast which by exam showed acid focal thickening at the 8:30 position, 4 cm from the nipple. Ultrasound showed 3 separate irregular hypoechoic areas in this region, measuring 0.9, 0.7 and 0.5 cm. Taken together these 2 areas at up to 2.1 cm in greatest diameter. There was no abnormal right axillary adenopathy.  2 of these masses were biopsied 08/02/2017. The final pathology ([id number]) showed both to consist of invasive ductal carcinoma, grade 2 with the prognostic panel (from the lesion at 8:30 o'clock 4 cm from the nipple) was estrogen receptor 90% positive, progesterone receptor 20% positive,  both with strong staining intensity, with an MIB-1 of 15%, and no HER-2 complication, the signals ratio being 1.00 and the number per cell 1.05.  The patient proceeded to right lumpectomy and axillary sentinel lymph node sampling 08/17/2017, with the final pathology ([id number]) showing 2 areas of invasive ductal carcinoma, grade 2, measuring 1.5 and 0.7 cm. Margins were broadly positive medially and focally laterally. The single sentinel lymph node was involved by tumor.  The patient's subsequent history is as detailed below.  INTERVAL HISTORY: Caroline [name] returns today for follow-up and treatment of her estrogen receptor positive breast cancer. She continues on tamoxifen, with good tolerance. She has occasional hot flashes that are not severe. She notes that she may get warm at times. She denies issues with vaginal wetness.    REVIEW OF SYSTEMS: Caroline [name] reports that she had left hip replacement in December 2018 under Dr. Zollie Beckers. She notes that she is still in a lot of pain. She notes that she is taking hydrocodone 3 times per day. She notes that she was taking Senokot for constipation but she stopped. She notes that she is no longer using a cane, and she is able to walk generally well. She notes that she gets tired easily. She notes that she had 5 in home physical therapy sessions, and she exercises at home. She notes the the right hip is now bothering her. She denies unusual headaches, visual changes, nausea, vomiting, or dizziness. There has been no unusual cough, phlegm production, or pleurisy. This been no change in bowel or bladder habits. She denies unexplained fatigue or unexplained weight loss, bleeding, rash, or fever. A detailed review of systems was otherwise stable.    PAST MEDICAL HISTORY: Past Medical History:  Diagnosis Date  . Arthritis    hips  .  Cancer ([place])    Left breast  . History of kidney stones   . Hypertension   . Paroxysmal atrial fibrillation (HCC)   . S/P  radiation therapy 10/10/17- 11/22/17   Right Breast/ 50 gy in 25 fractions, Right Breast boost/ 10 Gy in 5 fractions.    PAST SURGICAL HISTORY: Past Surgical History:  Procedure Laterality Date  . BREAST LUMPECTOMY WITH RADIOACTIVE SEED AND SENTINEL LYMPH NODE BIOPSY Right 08/17/2017   Procedure: RIGHT BREAST RADIOACTIVE SEED LOCALIZED LUMPECTOMY AND SENTINEL LYMPH NODE BIOPSY;  Surgeon: Erroll Luna, MD;  Location: [place];  Service: General;  Laterality: Right;  . CESAREAN SECTION  [id number]  . COLONOSCOPY    . HEMORRHOID SURGERY     20 years ago  . RE-EXCISION OF BREAST LUMPECTOMY Right 09/06/2017   Procedure: RE-EXCISION OF RIGHT BREAST LUMPECTOMY;  Surgeon: Erroll Luna, MD;  Location: [place];  Service: General;  Laterality: Right;  . TOE SURGERY Right    5th 20 years ago  . TOTAL HIP ARTHROPLASTY Left 12/01/2017   Procedure: LEFT TOTAL HIP ARTHROPLASTY ANTERIOR APPROACH;  Surgeon: Mcarthur Rossetti, MD;  Location[facility];  Service: Orthopedics;  Laterality: Left;    FAMILY HISTORY Family History  Problem Relation Age of Onset  . Breast cancer Cousin   The patient's father died from dementia at the age of 29. The patient's mother died from emphysema at the age of 65. The patient has 2 brothers, no sisters. On the paternal side a first cousin was diagnosed with breast cancer at the age of 54. There is no other breast ovarian or prostate cancer in the family but the patient is aware it is a small family. The patient's brother living in [place] had a melanoma diagnosed in his 24s  GYNECOLOGIC HISTORY:  Patient's last menstrual period was 11/25/2010. Menarche age 56, first live birth age 83, she is [place] P3. The middle child was stillborn. She stopped having periods in her late 49s. She did not take hormone replacement. She took oral contraceptives for approximately 15 years remotely with no complications.  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 Daughter Caroline [name] is currently living at home. She graduated from [employer] in exercise and sports signs. Son Caroline [name] is currently attending  [employer]. The patient is not a church attender.    ADVANCED DIRECTIVES:    HEALTH MAINTENANCE: Social History   Tobacco Use  . Smoking status: Current Every Day Smoker    Packs/day: 0.50    Years: 40.00    Pack years: 20.00    Types: Cigarettes  . Smokeless tobacco: Never Used  Substance Use Topics  . Alcohol use: Yes    Alcohol/week: 4.2 oz    Types: 7 Glasses of wine per week    Comment: wine nightly  . Drug use: No     Colonoscopy:/ [facility]  PAP:  Bone density: remote   Allergies  Allergen Reactions  . Sulfa Antibiotics Other (See Comments)    Unknown, was child    Current Outpatient Medications  Medication Sig Dispense Refill  . apixaban (ELIQUIS) 5 MG TABS tablet TAKE 1 TABLET TWICE A DAY (NEED APPOINTMENT FOR REFILLS [id number]) 180 tablet 0  . buPROPion (WELLBUTRIN XL) 150 MG 24 hr tablet Take 150 mg by mouth daily.    . fenofibrate 160 MG tablet Take 160 mg by mouth daily.    Marland Kitchen  HYDROcodone-acetaminophen (NORCO/VICODIN) 5-325 MG tablet Take 1-2 tablets by mouth every 6 (six) hours as needed for moderate pain. 60 tablet 0  . metoprolol succinate (TOPROL-XL) 25 MG 24 hr tablet Take 0.5 tablets (12.5 mg total) by mouth daily. 90 tablet 2  . tamoxifen (NOLVADEX) 20 MG tablet Take 1 tablet (20 mg total) by mouth daily. 90 tablet 4  . valsartan-hydrochlorothiazide (DIOVAN-HCT) 160-12.5 MG tablet Take 1 tablet by mouth daily.     No current facility-administered medications for this visit.     OBJECTIVE:Middle-aged white woman who appears well  Vitals:   01/10/18 1332  BP: 101/66  Pulse: 87  Resp: 18  Temp: 98.7 F (37.1 C)  SpO2: 94%     Body mass index is 29.21 kg/m.   Wt Readings from Last 3 Encounters:    01/10/18 170 lb 3.2 oz (77.2 kg)  01/09/18 171 lb (77.6 kg)  01/05/18 167 lb 12.8 oz (76.1 kg)      ECOG FS:1 - Symptomatic but completely ambulatory  Sclerae unicteric, pupils round and equal No cervical or supraclavicular adenopathy Lungs no rales or rhonchi Heart regular rate and rhythm Abd soft, nontender, positive bowel sounds MSK no focal spinal tenderness, no upper extremity lymphedema, walking without a cane, no limp Neuro: nonfocal, well oriented, appropriate affect Breasts: The right breast is undergone lumpectomy and radiation.  There is still moderate hyperpigmentation.  The cosmetic result is excellent.  There is no evidence of local recurrence.  The left breast is unremarkable.  Both axillae are benign.  LAB RESULTS:  CMP     Component Value Date/Time   NA 132 (L) 12/02/2017 0515   NA 140 08/31/2017 1616   K 3.8 12/02/2017 0515   K 3.9 08/31/2017 1616   CL 98 (L) 12/02/2017 0515   CO2 27 12/02/2017 0515   CO2 27 08/31/2017 1616   GLUCOSE 94 12/02/2017 0515   GLUCOSE 139 08/31/2017 1616   BUN 14 12/02/2017 0515   BUN 45.6 (H) 08/31/2017 1616   CREATININE 0.86 12/02/2017 0515   CREATININE 1.6 (H) 08/31/2017 1616   CALCIUM 8.3 (L) 12/02/2017 0515   CALCIUM 10.0 08/31/2017 1616   PROT 7.2 08/31/2017 1616   ALBUMIN 3.5 08/31/2017 1616   AST 12 08/31/2017 1616   ALT 14 08/31/2017 1616   ALKPHOS 76 08/31/2017 1616   BILITOT 0.25 08/31/2017 1616   GFRNONAA >60 12/02/2017 0515   GFRAA >60 12/02/2017 0515    No results found for: TOTALPROTELP, ALBUMINELP, A1GS, A2GS, BETS, BETA2SER, GAMS, MSPIKE, SPEI  No results found for: KPAFRELGTCHN, LAMBDASER, KAPLAMBRATIO  Lab Results  Component Value Date   WBC 5.1 01/10/2018   NEUTROABS 2.9 01/10/2018   HGB 8.0 (L) 12/04/2017   HCT 34.7 (L) 01/10/2018   MCV 95.3 01/10/2018   PLT 203 01/10/2018      Chemistry      Component Value Date/Time   NA 132 (L) 12/02/2017 0515   NA 140 08/31/2017 1616   K 3.8  12/02/2017 0515   K 3.9 08/31/2017 1616   CL 98 (L) 12/02/2017 0515   CO2 27 12/02/2017 0515   CO2 27 08/31/2017 1616   BUN 14 12/02/2017 0515   BUN 45.6 (H) 08/31/2017 1616   CREATININE 0.86 12/02/2017 0515   CREATININE 1.6 (H) 08/31/2017 1616      Component Value Date/Time   CALCIUM 8.3 (L) 12/02/2017 0515   CALCIUM 10.0 08/31/2017 1616   ALKPHOS 76 08/31/2017 1616   AST 12 08/31/2017 1616  ALT 14 08/31/2017 1616   BILITOT 0.25 08/31/2017 1616       No results found for: LABCA2  No components found for: PYKDXI338  No results for input(s): INR in the last 168 hours.  No results found for: LABCA2  No results found for: SNK539  No results found for: JQB341  No results found for: PFX902  No results found for: CA2729  No components found for: HGQUANT  No results found for: CEA1 / No results found for: CEA1   No results found for: AFPTUMOR  No results found for: CHROMOGRNA  No results found for: PSA1  Appointment on 01/10/2018  Component Date Value Ref Range Status  . WBC Count 01/10/2018 5.1  3.9 - 10.3 K/uL Final  . RBC 01/10/2018 3.64* 3.70 - 5.45 MIL/uL Final  . Hemoglobin 01/10/2018 11.3* 11.6 - 15.9 g/dL Final  . HCT 01/10/2018 34.7* 34.8 - 46.6 % Final  . MCV 01/10/2018 95.3  79.5 - 101.0 fL Final  . MCH 01/10/2018 31.0  25.1 - 34.0 pg Final  . MCHC 01/10/2018 32.6  31.5 - 36.0 g/dL Final  . RDW 01/10/2018 14.8  11.2 - 16.1 % Final  . Platelet Count 01/10/2018 203  145 - 400 K/uL Final  . Neutrophils Relative % 01/10/2018 56  % Final  . Neutro Abs 01/10/2018 2.9  1.5 - 6.5 K/uL Final  . Lymphocytes Relative 01/10/2018 32  % Final  . Lymphs Abs 01/10/2018 1.6  0.9 - 3.3 K/uL Final  . Monocytes Relative 01/10/2018 7  % Final  . Monocytes Absolute 01/10/2018 0.4  0.1 - 0.9 K/uL Final  . Eosinophils Relative 01/10/2018 4  % Final  . Eosinophils Absolute 01/10/2018 0.2  0.0 - 0.5 K/uL Final  . Basophils Relative 01/10/2018 1  % Final  . Basophils  Absolute 01/10/2018 0.0  0.0 - 0.1 K/uL Final   Performed at [facility], [place] [address], [place], [place] [address]    (this displays the last labs from the last 3 days)  No results found for: TOTALPROTELP, ALBUMINELP, A1GS, A2GS, BETS, BETA2SER, GAMS, MSPIKE, SPEI (this displays SPEP labs)  No results found for: KPAFRELGTCHN, LAMBDASER, KAPLAMBRATIO (kappa/lambda light chains)  No results found for: HGBA, HGBA2QUANT, HGBFQUANT, HGBSQUAN (Hemoglobinopathy evaluation)   No results found for: LDH  No results found for: IRON, TIBC, IRONPCTSAT (Iron and TIBC)  No results found for: FERRITIN  Urinalysis No results found for: COLORURINE, APPEARANCEUR, LABSPEC, PHURINE, GLUCOSEU, HGBUR, BILIRUBINUR, KETONESUR, PROTEINUR, UROBILINOGEN, NITRITE, LEUKOCYTESUR   STUDIES: Next mammogram due late July 23, 2018  ELIGIBLE FOR AVAILABLE RESEARCH PROTOCOL: no  ASSESSMENT: 62 y.o. [place], [place] woman status post right breast upper outer quadrant biopsy 08/02/2017 for a clinical T1c No invasive ductal carcinoma, grade 2, estrogen and progesterone receptor positive, HER-2 not amplified, with an MIB-1 of 15%.  (1) status post right lumpectomy and axillary sentinel lymph node sampling 08/17/2017 for an mpT1c pN1, stage IB invasive ductal carcinoma, grade 2, with positive margins  (2) additional surgery 09/06/2017 successfully cleared margins.  (3) Mammaprint report shows "low-risk [facility] "tumor, predicting a good prognosis without chemotherapy and no significant benefit from chemotherapy  (4) adjuvant radiation completed 11/22/2017 Site/dose: 1) Right Breast / 50 Gy in 25 fractions  2) Right Breast Boost / 10 Gy in 5 fractions  (5) tamoxifen started 10/04/2017  (6) genetics testing scheduling requested 01/10/2018  (7) paroxysmal atrial fibrillation: On lifelong anticoagulation (apixaban)  PLAN: Shoshanah is tolerating tamoxifen well and the  plan will be to  continue that a total of 5 years.  She is tolerating tamoxifen well and the plan will be to continue that for a total of 5 years.  Of course tamoxifen has a slightly procoagulant effect, about the same level as oral contraceptives.  Since she is on therapeutic levels of apixaban I do not anticipate a problem.  Today we discussed genetics testing in detail.  After much discussion she decided she would be interested in considering that and I am placing a genetics testing appointment for her.  She is considering contralateral hip replacement.  It is nice to see how well she is recovering from her hip surgery just a month ago.  Her hemoglobin has risen from 8.0 to 11.3 with a normal MCV.  I do not see any problems with her undergoing a second hip replacement if necessary sometime this year  Her next mammogram will be late July.  She will see me a couple of weeks later to discuss results.  If all continues well I will start seeing her on a once a year basis until   Chauncey Cruel, MD  01/10/18 2:10 PM Medical Oncology and Hematology [facility] [place], [place] [address] Tel. [phone]    Fax. [phone]  This document serves as a record of services personally performed by Lurline Del, MD. It was created on his behalf by Sheron Nightingale, a trained medical scribe. The creation of this record is based on the scribe's personal observations and the provider's statements to them.   I have reviewed the above documentation for accuracy and completeness, and I agree with the above.

## 2018-01-09 NOTE — Progress Notes (Signed)
Primary Care Physician: Selinda Orion Referring Physician: [facility] ER   Caroline [name] is a 62 y.o. female with a h/o paroxysmal afib that was diagnosed in the [facility] ER and had f/u with Chanetta Marshall, NP, in the afib f/u. Echo done and without significant finds.She is on metoprolol ER and Xarelto 20 mg qd for a chadsvasc score of 2(htn, female).  She is in afib clinic for f/u and co/ of almost daily palpitations, mostly worse at night. She denies any snoring/apnea. She continues to smoke. She will take occasional BB for palpitations. Continues on xarelto.  F/u in afib clinic, 1/15. Since I saw her last she has had 2 lumpectomies and radiation for breat cancer. She also just had rt hip replacement 12/6 and is recovering nicely.She is not aware of any  issues with afib during this last year. No issues with bleeding with eliquis. She is also retired at this point, having been let go of her job of 30 years.  Today, she denies symptoms of  chest pain, shortness of breath, orthopnea, PND, lower extremity edema, dizziness, presyncope, syncope, or neurologic sequela. The patient is tolerating medications without difficulties and is otherwise without complaint today.   Past Medical History:  Diagnosis Date  . Arthritis    hips  . Cancer ([place])    Left breast  . History of kidney stones   . Hypertension   . Paroxysmal atrial fibrillation (HCC)   . S/P radiation therapy 10/10/17- 11/22/17   Right Breast/ 50 gy in 25 fractions, Right Breast boost/ 10 Gy in 5 fractions.   Past Surgical History:  Procedure Laterality Date  . BREAST LUMPECTOMY WITH RADIOACTIVE SEED AND SENTINEL LYMPH NODE BIOPSY Right 08/17/2017   Procedure: RIGHT BREAST RADIOACTIVE SEED LOCALIZED LUMPECTOMY AND SENTINEL LYMPH NODE BIOPSY;  Surgeon: Erroll Luna, MD;  Location: [place];  Service: General;  Laterality: Right;  . CESAREAN SECTION  [phone]  . COLONOSCOPY    . HEMORRHOID SURGERY     20 years ago  . RE-EXCISION OF BREAST LUMPECTOMY Right 09/06/2017   Procedure: RE-EXCISION OF RIGHT BREAST LUMPECTOMY;  Surgeon: Erroll Luna, MD;  Location: [place];  Service: General;  Laterality: Right;  . TOE SURGERY Right    5th 20 years ago  . TOTAL HIP ARTHROPLASTY Left 12/01/2017   Procedure: LEFT TOTAL HIP ARTHROPLASTY ANTERIOR APPROACH;  Surgeon: Mcarthur Rossetti, MD;  [facility];  Service: Orthopedics;  Laterality: Left;    Current Outpatient Medications  Medication Sig Dispense Refill  . apixaban (ELIQUIS) 5 MG TABS tablet TAKE 1 TABLET TWICE A DAY (NEED APPOINTMENT FOR REFILLS [phone]) 180 tablet 0  . buPROPion (WELLBUTRIN XL) 150 MG 24 hr tablet Take 150 mg by mouth daily.    . fenofibrate 160 MG tablet Take 160 mg by mouth daily.    Marland Kitchen HYDROcodone-acetaminophen (NORCO/VICODIN) 5-325 MG tablet Take 1-2 tablets by mouth every 6 (six) hours as needed for moderate pain. 60 tablet 0  . methocarbamol (ROBAXIN) 500 MG tablet Take 1 tablet (500 mg total) by mouth every 6 (six) hours as needed for muscle spasms. 60 tablet 0  . metoprolol succinate (TOPROL-XL) 25 MG 24 hr tablet TAKE 1 TABLET DAILY (Patient taking differently: Take 25 mg by mouth at bedtime) 90 tablet 2  . tamoxifen (NOLVADEX) 20 MG tablet Take 20 mg by mouth daily.    . valsartan-hydrochlorothiazide (DIOVAN-HCT) 160-12.5 MG tablet Take 1 tablet by mouth daily.  No current facility-administered medications for this encounter.     Allergies  Allergen Reactions  . Sulfa Antibiotics Other (See Comments)    Unknown, was child    Social History   Socioeconomic History  . Marital status: Married    Spouse name: Not on file  . Number of children: Not on file  . Years of education: Not on file  . Highest education level: Not on file  Social Needs  . Financial resource strain: Not on file  . Food insecurity - worry: Not on file  . Food insecurity - inability: Not on file  .  Transportation needs - medical: Not on file  . Transportation needs - non-medical: Not on file  Occupational History  . Not on file  Tobacco Use  . Smoking status: Current Every Day Smoker    Packs/day: 0.50    Years: 40.00    Pack years: 20.00    Types: Cigarettes  . Smokeless tobacco: Never Used  Substance and Sexual Activity  . Alcohol use: Yes    Alcohol/week: 4.2 oz    Types: 7 Glasses of wine per week    Comment: wine nightly  . Drug use: No  . Sexual activity: Yes    Birth control/protection: Post-menopausal  Other Topics Concern  . Not on file  Social History Narrative  . Not on file    Family History  Problem Relation Age of Onset  . Breast cancer Cousin     ROS- All systems are reviewed and negative except as per the HPI above  Physical Exam: Vitals:   01/09/18 1336  BP: 108/64  Pulse: 88  Weight: 171 lb (77.6 kg)  Height: 5\' 4"  (1.626 m)    GEN- The patient is well appearing, alert and oriented x 3 today.   Head- normocephalic, atraumatic Eyes-  Sclera clear, conjunctiva pink Ears- hearing intact Oropharynx- clear Neck- supple, no JVP Lymph- no cervical lymphadenopathy Lungs- Clear to ausculation bilaterally, normal work of breathing Heart- Regular rate and rhythm, no murmurs, rubs or gallops, PMI not laterally displaced GI- soft, NT, ND, + BS Extremities- no clubbing, cyanosis, or edema MS- no significant deformity or atrophy Skin- no rash or lesion Psych- euthymic mood, full affect Neuro- strength and sensation are intact  EKG- NSR at 88 bpm, pr int 142 ms, qrs int 80 bpm, qtc 471 ms Epic records reviewed  Assessment and Plan: 1. H/o  Paroxysmal atrial fibrillation  No awareness of irregular heart beat Continue eliquis 5 mg bid for chadsvasc score of at least 2 Last creatinine 0.86  Will reduce metoprolol succinate to 1./2 tab at hs for fatigue  F/u in afib clinic in one year  [name], [facility] [address] [place], [place] [address] [phone]

## 2018-01-10 ENCOUNTER — Telehealth: Payer: Self-pay | Admitting: Oncology

## 2018-01-10 ENCOUNTER — Inpatient Hospital Stay: Payer: 59

## 2018-01-10 ENCOUNTER — Inpatient Hospital Stay: Payer: 59 | Attending: Oncology | Admitting: Oncology

## 2018-01-10 VITALS — BP 101/66 | HR 87 | Temp 98.7°F | Resp 18 | Ht 64.0 in | Wt 170.2 lb

## 2018-01-10 DIAGNOSIS — C50411 Malignant neoplasm of upper-outer quadrant of right female breast: Secondary | ICD-10-CM | POA: Diagnosis not present

## 2018-01-10 DIAGNOSIS — Z170 Estrogen receptor positive status [ER+]: Secondary | ICD-10-CM | POA: Diagnosis not present

## 2018-01-10 DIAGNOSIS — Z923 Personal history of irradiation: Secondary | ICD-10-CM

## 2018-01-10 DIAGNOSIS — I480 Paroxysmal atrial fibrillation: Secondary | ICD-10-CM | POA: Insufficient documentation

## 2018-01-10 DIAGNOSIS — Z7901 Long term (current) use of anticoagulants: Secondary | ICD-10-CM | POA: Insufficient documentation

## 2018-01-10 DIAGNOSIS — I4891 Unspecified atrial fibrillation: Secondary | ICD-10-CM

## 2018-01-10 DIAGNOSIS — Z79810 Long term (current) use of selective estrogen receptor modulators (SERMs): Secondary | ICD-10-CM | POA: Diagnosis not present

## 2018-01-10 DIAGNOSIS — C50511 Malignant neoplasm of lower-outer quadrant of right female breast: Secondary | ICD-10-CM

## 2018-01-10 LAB — CMP (CANCER CENTER ONLY)
ALT: 10 U/L (ref 0–55)
AST: 12 U/L (ref 5–34)
Albumin: 3.6 g/dL (ref 3.5–5.0)
Alkaline Phosphatase: 81 U/L (ref 40–150)
Anion gap: 9 (ref 3–11)
BILIRUBIN TOTAL: 0.3 mg/dL (ref 0.2–1.2)
BUN: 34 mg/dL — AB (ref 7–26)
CHLORIDE: 105 mmol/L (ref 98–109)
CO2: 28 mmol/L (ref 22–29)
CREATININE: 1.18 mg/dL — AB (ref 0.60–1.10)
Calcium: 9.3 mg/dL (ref 8.4–10.4)
GFR, EST NON AFRICAN AMERICAN: 49 mL/min — AB (ref >60)
GFR, Est AFR Am: 57 mL/min — ABNORMAL LOW (ref >60)
Glucose, Bld: 134 mg/dL (ref 70–140)
POTASSIUM: 4.3 mmol/L (ref 3.3–4.7)
Sodium: 142 mmol/L (ref 136–145)
TOTAL PROTEIN: 6.7 g/dL (ref 6.4–8.3)

## 2018-01-10 LAB — CBC WITH DIFFERENTIAL (CANCER CENTER ONLY)
BASOS ABS: 0 10*3/uL (ref 0.0–0.1)
Basophils Relative: 1 %
EOS ABS: 0.2 10*3/uL (ref 0.0–0.5)
EOS PCT: 4 %
HCT: 34.7 % — ABNORMAL LOW (ref 34.8–46.6)
HEMOGLOBIN: 11.3 g/dL — AB (ref 11.6–15.9)
LYMPHS ABS: 1.6 10*3/uL (ref 0.9–3.3)
LYMPHS PCT: 32 %
MCH: 31 pg (ref 25.1–34.0)
MCHC: 32.6 g/dL (ref 31.5–36.0)
MCV: 95.3 fL (ref 79.5–101.0)
Monocytes Absolute: 0.4 10*3/uL (ref 0.1–0.9)
Monocytes Relative: 7 %
NEUTROS PCT: 56 %
Neutro Abs: 2.9 10*3/uL (ref 1.5–6.5)
PLATELETS: 203 10*3/uL (ref 145–400)
RBC: 3.64 MIL/uL — ABNORMAL LOW (ref 3.70–5.45)
RDW: 14.8 % (ref 11.2–16.1)
WBC: 5.1 10*3/uL (ref 3.9–10.3)

## 2018-01-10 MED ORDER — TAMOXIFEN CITRATE 20 MG PO TABS: 20.0000 mg | ORAL_TABLET | Freq: Every day | ORAL | 4 refills | Status: DC

## 2018-01-10 NOTE — Telephone Encounter (Signed)
Patient stopped by scheduling and said that she would call back and schedule her 1/16 los appts.

## 2018-01-11 ENCOUNTER — Ambulatory Visit (INDEPENDENT_AMBULATORY_CARE_PROVIDER_SITE_OTHER): Payer: 59 | Admitting: Orthopaedic Surgery

## 2018-01-15 ENCOUNTER — Telehealth (INDEPENDENT_AMBULATORY_CARE_PROVIDER_SITE_OTHER): Payer: Self-pay | Admitting: Orthopaedic Surgery

## 2018-01-15 ENCOUNTER — Other Ambulatory Visit (INDEPENDENT_AMBULATORY_CARE_PROVIDER_SITE_OTHER): Payer: Self-pay | Admitting: Orthopaedic Surgery

## 2018-01-15 MED ORDER — HYDROCODONE-ACETAMINOPHEN 5-325 MG PO TABS: 1.0000 | ORAL_TABLET | Freq: Four times a day (QID) | ORAL | 0 refills | Status: DC | PRN

## 2018-01-15 NOTE — Telephone Encounter (Signed)
She can come and pick up a prescription

## 2018-01-15 NOTE — Telephone Encounter (Signed)
Patient aware Rx ready at front desk  

## 2018-01-15 NOTE — Telephone Encounter (Signed)
Patient called asking for a refill on hydrocodone. CB # [phone]

## 2018-01-15 NOTE — Telephone Encounter (Signed)
Please advise 

## 2018-02-01 ENCOUNTER — Telehealth (INDEPENDENT_AMBULATORY_CARE_PROVIDER_SITE_OTHER): Payer: Self-pay | Admitting: Orthopaedic Surgery

## 2018-02-01 MED ORDER — HYDROCODONE-ACETAMINOPHEN 5-325 MG PO TABS: 1.0000 | ORAL_TABLET | Freq: Four times a day (QID) | ORAL | 0 refills | Status: DC | PRN

## 2018-02-01 NOTE — Telephone Encounter (Signed)
Can come and pick up script 

## 2018-02-01 NOTE — Telephone Encounter (Signed)
Patient called needing Rx refilled (Hydrocodone) The number to contact patient is [phone]

## 2018-02-01 NOTE — Telephone Encounter (Signed)
Patient aware Rx ready at front desk  

## 2018-02-01 NOTE — Telephone Encounter (Signed)
Please advise 

## 2018-02-05 ENCOUNTER — Ambulatory Visit (INDEPENDENT_AMBULATORY_CARE_PROVIDER_SITE_OTHER): Payer: 59 | Admitting: Orthopaedic Surgery

## 2018-02-05 ENCOUNTER — Ambulatory Visit (INDEPENDENT_AMBULATORY_CARE_PROVIDER_SITE_OTHER): Payer: 59 | Admitting: Physician Assistant

## 2018-02-05 ENCOUNTER — Encounter (INDEPENDENT_AMBULATORY_CARE_PROVIDER_SITE_OTHER): Payer: Self-pay | Admitting: Orthopaedic Surgery

## 2018-02-05 DIAGNOSIS — Z96642 Presence of left artificial hip joint: Secondary | ICD-10-CM

## 2018-02-05 NOTE — Progress Notes (Signed)
The patient is now 6 weeks status post a left total hip arthroplasty.  She says she is getting there in terms of the pain decreasing.  She is been having some right hip pain as well as significant pain as it involves her low back.  On examination put both hips through full internal and external rotation with minimal discomfort.  Her leg lengths are equal.  This point I do not really need to see her back until her 72-month postop visit which is 4 months from now.  At that visit I would like an AP pelvis as well as a lateral of both her hips.  We can also obtain an AP and lateral of her lumbar spine if she is having issues with her back.  I talked her about continuing her narcotics for a little bit longer and trying to wean her after about 3 months postop which she understands.

## 2018-02-19 ENCOUNTER — Encounter: Payer: 59 | Admitting: Genetics

## 2018-02-19 ENCOUNTER — Telehealth (INDEPENDENT_AMBULATORY_CARE_PROVIDER_SITE_OTHER): Payer: Self-pay | Admitting: Orthopaedic Surgery

## 2018-02-19 ENCOUNTER — Other Ambulatory Visit: Payer: 59

## 2018-02-19 ENCOUNTER — Other Ambulatory Visit (INDEPENDENT_AMBULATORY_CARE_PROVIDER_SITE_OTHER): Payer: Self-pay | Admitting: Orthopaedic Surgery

## 2018-02-19 MED ORDER — HYDROCODONE-ACETAMINOPHEN 5-325 MG PO TABS: 1.0000 | ORAL_TABLET | Freq: Three times a day (TID) | ORAL | 0 refills | Status: DC | PRN

## 2018-02-19 NOTE — Telephone Encounter (Signed)
Please advise 

## 2018-02-19 NOTE — Telephone Encounter (Signed)
I sent some into her pharmacy which is [facility] in [place].

## 2018-02-19 NOTE — Telephone Encounter (Signed)
Patient aware.

## 2018-02-19 NOTE — Telephone Encounter (Signed)
Med refill  Hydrocodone-acetaminophen

## 2018-02-24 ENCOUNTER — Other Ambulatory Visit (HOSPITAL_COMMUNITY): Payer: Self-pay | Admitting: Nurse Practitioner

## 2018-03-06 ENCOUNTER — Other Ambulatory Visit (INDEPENDENT_AMBULATORY_CARE_PROVIDER_SITE_OTHER): Payer: Self-pay | Admitting: Orthopaedic Surgery

## 2018-03-06 ENCOUNTER — Telehealth (INDEPENDENT_AMBULATORY_CARE_PROVIDER_SITE_OTHER): Payer: Self-pay | Admitting: Orthopaedic Surgery

## 2018-03-06 MED ORDER — HYDROCODONE-ACETAMINOPHEN 5-325 MG PO TABS: 1.0000 | ORAL_TABLET | Freq: Three times a day (TID) | ORAL | 0 refills | Status: DC | PRN

## 2018-03-06 NOTE — Telephone Encounter (Signed)
Please advise 

## 2018-03-06 NOTE — Telephone Encounter (Signed)
I sent some in to her pharmacy.  Do call her and let her know that this will need to be the last time we call in a narcotic because it will be over 3 months since her surgery.

## 2018-03-06 NOTE — Telephone Encounter (Signed)
Patient requesting RX refill on Hydrocodone. Patients # [phone]

## 2018-03-07 ENCOUNTER — Other Ambulatory Visit (INDEPENDENT_AMBULATORY_CARE_PROVIDER_SITE_OTHER): Payer: Self-pay

## 2018-03-07 DIAGNOSIS — M25551 Pain in right hip: Secondary | ICD-10-CM

## 2018-03-07 NOTE — Telephone Encounter (Signed)
Sent an order for hip injection

## 2018-03-07 NOTE — Telephone Encounter (Signed)
She states injections in the past didn't work for her But she is willing to try States she just isn't ready for another surgery yet

## 2018-03-07 NOTE — Telephone Encounter (Signed)
Do set her up for an intra-articular injection in her right hip under fluoroscopy by Dr. Ernestina Patches

## 2018-03-07 NOTE — Telephone Encounter (Signed)
I'm fine with pain management, she just needs a note that I do not like keeping patients on any long-term narcotics at all.

## 2018-03-07 NOTE — Telephone Encounter (Signed)
We can still see her for her right hip.  She does need to understand that we do not keep anybody on long-term narcotics just to treat arthritis pain.  We will add her on the narcotics to treat the postoperative pain for her left hip and we need to wean her from that.  We can always set her up for an intra-articular injection in her right hip.  Certainly if she wants to continue hydrocodone long-term we would need to send her to pain management because this is some than we would like to do long-term.

## 2018-03-07 NOTE — Telephone Encounter (Signed)
I think she meant she wanted you to send her to a pain management doctor to continue Hydrocodone

## 2018-03-07 NOTE — Telephone Encounter (Signed)
Patient states the hydrocodone is really working for her since the other hip is bothering her, and said you guys had discussed a pain management you would send her to?

## 2018-03-08 ENCOUNTER — Other Ambulatory Visit (INDEPENDENT_AMBULATORY_CARE_PROVIDER_SITE_OTHER): Payer: Self-pay

## 2018-03-08 DIAGNOSIS — G8929 Other chronic pain: Secondary | ICD-10-CM

## 2018-03-08 DIAGNOSIS — M5442 Lumbago with sciatica, left side: Principal | ICD-10-CM

## 2018-03-08 DIAGNOSIS — M25551 Pain in right hip: Secondary | ICD-10-CM

## 2018-03-22 ENCOUNTER — Telehealth (INDEPENDENT_AMBULATORY_CARE_PROVIDER_SITE_OTHER): Payer: Self-pay | Admitting: Orthopaedic Surgery

## 2018-03-22 MED ORDER — HYDROCODONE-ACETAMINOPHEN 5-325 MG PO TABS: 1.0000 | ORAL_TABLET | Freq: Two times a day (BID) | ORAL | 0 refills | Status: DC | PRN

## 2018-03-22 NOTE — Telephone Encounter (Signed)
I sent in a few norco.  She still needs to use this sparingly.

## 2018-03-22 NOTE — Telephone Encounter (Signed)
Patient called stating she thinks she wants to have surgery She made an appt with Artis Delay Monday to discuss but asks for a refill of her hydrocodone until then

## 2018-03-22 NOTE — Telephone Encounter (Signed)
Patient called and requested a call back in regards to her treatment options.  Please call patient to advise

## 2018-03-23 NOTE — Telephone Encounter (Signed)
Patient aware this was sent in for her and to take sparingly

## 2018-03-26 ENCOUNTER — Ambulatory Visit (INDEPENDENT_AMBULATORY_CARE_PROVIDER_SITE_OTHER): Payer: 59

## 2018-03-26 ENCOUNTER — Ambulatory Visit (INDEPENDENT_AMBULATORY_CARE_PROVIDER_SITE_OTHER): Payer: 59 | Admitting: Physician Assistant

## 2018-03-26 ENCOUNTER — Encounter (INDEPENDENT_AMBULATORY_CARE_PROVIDER_SITE_OTHER): Payer: Self-pay | Admitting: Physician Assistant

## 2018-03-26 DIAGNOSIS — M545 Low back pain, unspecified: Secondary | ICD-10-CM

## 2018-03-26 DIAGNOSIS — M25551 Pain in right hip: Secondary | ICD-10-CM | POA: Diagnosis not present

## 2018-03-26 DIAGNOSIS — M1611 Unilateral primary osteoarthritis, right hip: Secondary | ICD-10-CM | POA: Diagnosis not present

## 2018-03-26 NOTE — Progress Notes (Signed)
Office Visit Note   Patient: Caroline [name]           Date of Birth: [birth year]           MRN: [id number] Visit Date: 03/26/2018              Requested by: Aura Dials, PA-C [place], [place] [id number] PCP: Aura Dials, PA-C   Assessment & Plan: Visit Diagnoses:  1. Pain in right hip   2. Bilateral low back pain without sciatica, unspecified chronicity   3. Primary osteoarthritis of right hip     Plan: After examination of her today and reviewed the radiographs is felt that most of her pain is coming from her right hip.  Right hip pain is causing her significant pain and affecting her life daily with simple activities such as donning socks and shoes and getting in and out of the car.  Therefore would recommend right total hip arthroplasty in the future.  Since she is just 3 months status post left total hip arthroplasty she understands the risks benefits and postoperative protocol.  She would like to proceed with right total hip arthroplasty  sometime in June.  She is given Samella Parr card and will contact her whenever she wants to set up surgery.  She will follow-up 2 weeks postop  Follow-Up Instructions: Return in about 2 weeks (around 04/09/2018).   Orders:  Orders Placed This Encounter  Procedures  . XR HIP UNILAT W OR W/O PELVIS 2-3 VIEWS RIGHT  . XR Lumbar Spine 2-3 Views   No orders of the defined types were placed in this encounter.     Procedures: No procedures performed   Clinical Data: No additional findings.   Subjective: No chief complaint on file.   HPI Caroline [name] returns today due to increasing right hip pain.  She states her hip pain in the right is became worse since the left total hip arthroplasty which was done 3 months ago.  She denies any radicular symptoms down the left leg.  She does have some low back pain particularly on the left side.  She notes she has trouble going up and down steps.  She is having trouble  getting in and out of the car , also donning shoes and socks.  She has some low back pain particularly on the left with most of the pain is in the right hip.  She had previous intra-articular injections of both hips the left hip only gave her a days worth of pain related to the right she had approximately 2-3 days of pain relief.   Review of Systems See HPI  Objective: Vital Signs: LMP 11/25/2010   Physical Exam  Constitutional: She is oriented to person, place, and time. She appears well-developed and well-nourished. No distress.  Pulmonary/Chest: Effort normal.  Neurological: She is alert and oriented to person, place, and time.  Skin: She is not diaphoretic.  Psychiatric: She has a normal mood and affect.    Ortho Exam Left hip good range of motion without pain.  Right hip she has pain with internal rotation.  Limited internal rotation of the right hip compared to left.  Negative straight leg raise bilaterally.  She has tenderness left paraspinous region.  Slight left hip trochanteric tenderness.  She comes within an inch or 2 of being able to touch her toes.  Has limited extension of the lumbar spine. Specialty Comments:  No specialty comments available.  Imaging: Xr  Hip Unilat W Or W/o Pelvis 2-3 Views Right  Result Date: 03/26/2018 AP pelvis lateral view of the right hip: Both hips are well located.  A left total hip arthroplasty components appear all well seated.  Right hip with end-stage osteoarthritis with near bone-on-bone.  No acute fractures.  Xr Lumbar Spine 2-3 Views  Result Date: 03/26/2018 Lumbar spine 2 views: No acute fracture.  Degenerative disc disease at L3 2.  Endplate spurring in the lower thoracic upper lumbar region.  No spondylo-listhesis.  Normal lordotic curvature.    PMFS History: Patient Active Problem List   Diagnosis Date Noted  . Atrial fibrillation by electrocardiogram ([place]) 01/10/2018  . Status post total replacement of left hip 12/01/2017  . Pain  in left hip 10/05/2017  . Unilateral primary osteoarthritis, left hip 10/05/2017  . 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 Hyperlipidemia 01/03/2013  . Chest pain 11/26/2011  . Tobacco abuse 11/26/2011  . HTN (hypertension) 11/26/2011   Past Medical History:  Diagnosis Date  . Arthritis    hips  . Cancer ([facility])    Left breast  . History of kidney stones   . Hypertension   . Paroxysmal atrial fibrillation (H[facility])   . S/P radiation therapy 10/10/17- 11/22/17   Right Breast/ 50 gy in 25 fractions, Right Breast boost/ 10 Gy in 5 fractions.    Family History  Problem Relation Age of Onset  . Breast cancer Cousin     Past Surgical History:  Procedure Laterality Date  . BREAST LUMPECTOMY WITH RADIOACTIVE SEED AND SENTINEL LYMPH NODE BIOPSY Right 08/17/2017   Procedure: RIGHT BREAST RADIOACTIVE SEED LOCALIZED LUMPECTOMY AND SENTINEL LYMPH NODE BIOPSY;  Surgeon: Erroll Luna, MD;  Location: [facility];  Service: General;  Laterality: Right;  . CESAREAN SECTION  [phone]  . COLONOSCOPY    . HEMORRHOID SURGERY     20 years ago  . RE-EXCISION OF BREAST LUMPECTOMY Right 09/06/2017   Procedure: RE-EXCISION OF RIGHT BREAST LUMPECTOMY;  Surgeon: Erroll Luna, MD;  Location: [facility];  Service: General;  Laterality: Right;  . TOE SURGERY Right    5th 20 years ago  . TOTAL HIP ARTHROPLASTY Left 12/01/2017   Procedure: LEFT TOTAL HIP ARTHROPLASTY ANTERIOR APPROACH;  Surgeon: Mcarthur Rossetti, MD;  Location: [facility];  Service: Orthopedics;  Laterality: Left;   Social History   Occupational History  . Not on file  Tobacco Use  . Smoking status: Current Every Day Smoker    Packs/day: 0.50    Years: 40.00    Pack years: 20.00    Types: Cigarettes  . Smokeless tobacco: Never Used  Substance and Sexual Activity  . Alcohol use: Yes    Alcohol/week: 4.2 oz    Types: 7 Glasses of  wine per week    Comment: wine nightly  . Drug use: No  . Sexual activity: Yes    Birth control/protection: Post-menopausal

## 2018-04-12 ENCOUNTER — Other Ambulatory Visit (HOSPITAL_COMMUNITY): Payer: Self-pay | Admitting: Nurse Practitioner

## 2018-04-29 IMAGING — MG BREAST SURGICAL SPECIMEN
2 series · 2 of 2 positions shown · non-contrast
Comparison: Previous exam(s).

CLINICAL DATA: Two radioactive seeds were placed in the right
breast on 08/14/2017 prior to surgery. The patient has a recent
diagnosis of right breast cancer.

EXAM:
SPECIMEN RADIOGRAPH OF THE RIGHT BREAST

[R (1 of 2)]
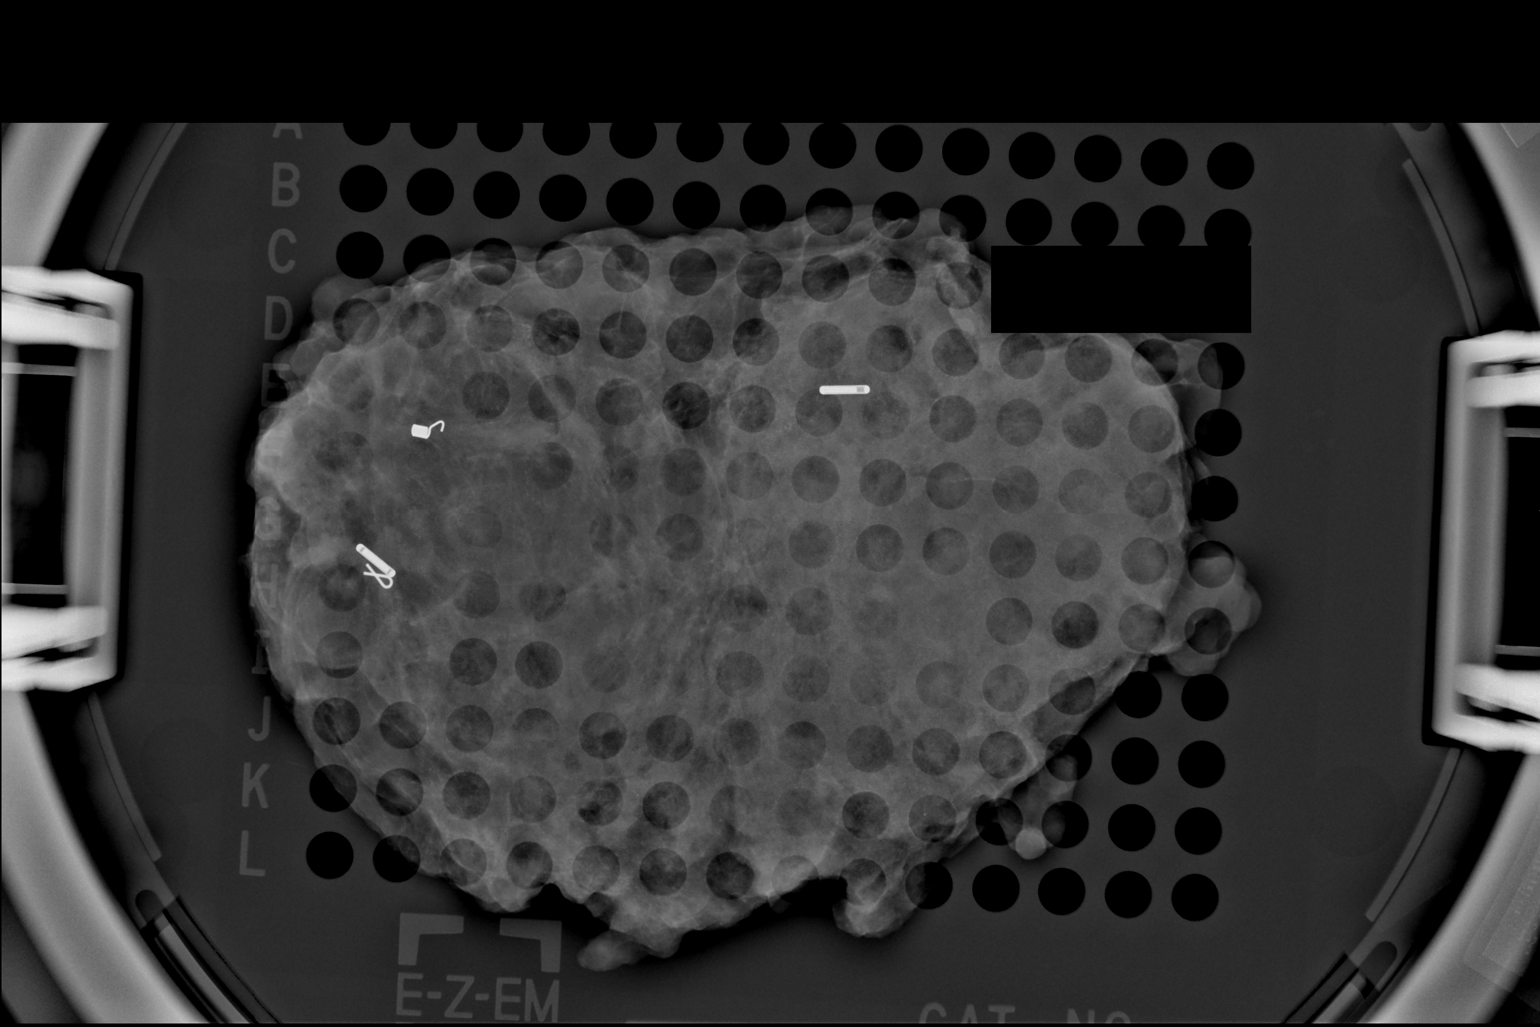

[R (2 of 2)]
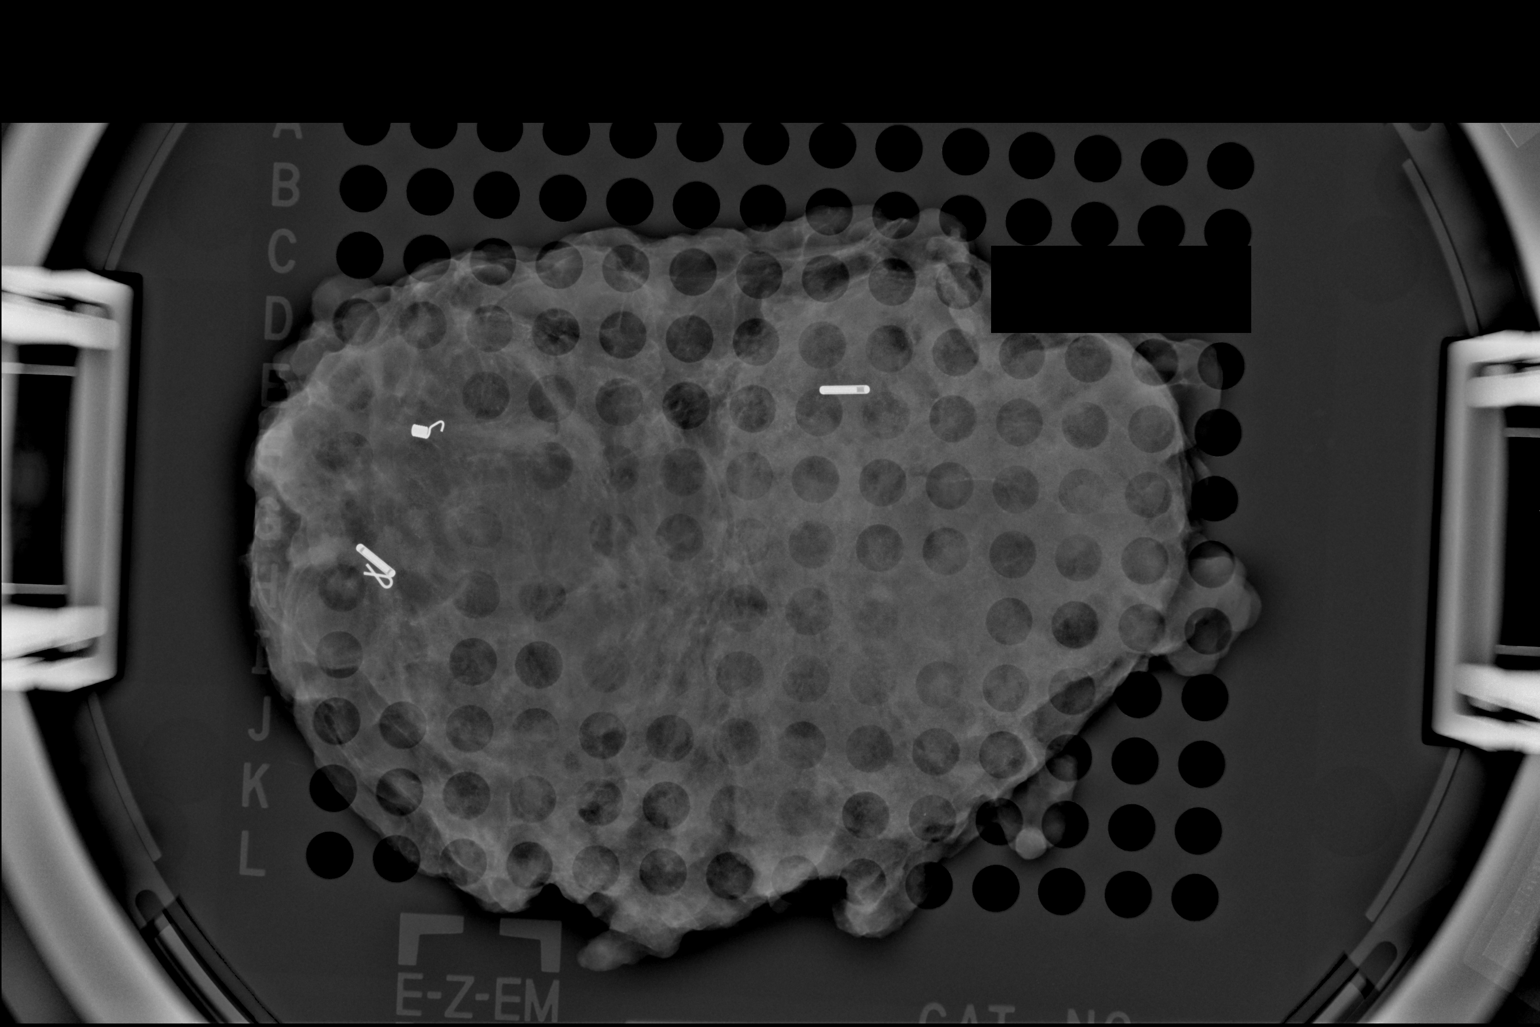

[2 of 2 positions shown; findings below may reference images not displayed]

FINDINGS: Status post excision of the right breast. Two radioactive seeds, a
ribbon shaped biopsy marker clip, and a coil shaped biopsy marker
clip are present, completely intact, and were marked for pathology.
IMPRESSION: Specimen radiograph of the right breast.

## 2018-05-30 ENCOUNTER — Telehealth (HOSPITAL_COMMUNITY): Payer: Self-pay | Admitting: *Deleted

## 2018-05-30 ENCOUNTER — Other Ambulatory Visit (HOSPITAL_COMMUNITY): Payer: Self-pay | Admitting: *Deleted

## 2018-05-30 MED ORDER — METOPROLOL SUCCINATE ER 25 MG PO TB24: 25.0000 mg | ORAL_TABLET | Freq: Every day | ORAL | 2 refills | Status: DC

## 2018-05-30 NOTE — Telephone Encounter (Signed)
Pt called in stating after decreasing her metoprolol to 1/2 tab daily she didn't feel as well. She has went back up to 1 tablet daily as originally prescribed and feeling much better.

## 2018-06-05 ENCOUNTER — Ambulatory Visit (INDEPENDENT_AMBULATORY_CARE_PROVIDER_SITE_OTHER): Payer: 59 | Admitting: Orthopaedic Surgery

## 2018-06-05 ENCOUNTER — Ambulatory Visit (INDEPENDENT_AMBULATORY_CARE_PROVIDER_SITE_OTHER): Payer: 59 | Admitting: Physician Assistant

## 2018-06-07 ENCOUNTER — Telehealth (INDEPENDENT_AMBULATORY_CARE_PROVIDER_SITE_OTHER): Payer: Self-pay | Admitting: Orthopaedic Surgery

## 2018-06-07 NOTE — Telephone Encounter (Signed)
Patient requesting x-ray disc of just the lumbar spine, not of the hip. She believes there might be 2 or 3 images. I told patient it would be a $5 charge and that she would need to fill out medical release form. Please call once ready for pick up # [phone]

## 2018-06-08 NOTE — Telephone Encounter (Signed)
Called to inform patient CD is ready for pick up at front desk.

## 2018-06-20 ENCOUNTER — Other Ambulatory Visit: Payer: Self-pay | Admitting: Orthopedic Surgery

## 2018-06-20 DIAGNOSIS — M4316 Spondylolisthesis, lumbar region: Secondary | ICD-10-CM

## 2018-08-06 ENCOUNTER — Ambulatory Visit
Admission: RE | Admit: 2018-08-06 | Discharge: 2018-08-06 | Disposition: A | Discharge status: Home / Self Care | Payer: 59 | Source: Ambulatory Visit | Attending: Oncology | Admitting: Oncology

## 2018-08-06 DIAGNOSIS — I4891 Unspecified atrial fibrillation: Secondary | ICD-10-CM

## 2018-08-06 DIAGNOSIS — C50511 Malignant neoplasm of lower-outer quadrant of right female breast: Secondary | ICD-10-CM

## 2018-08-06 DIAGNOSIS — Z170 Estrogen receptor positive status [ER+]: Secondary | ICD-10-CM

## 2018-08-13 IMAGING — DX DG PORTABLE PELVIS
1 series · 1 of 1 positions shown · non-contrast
Comparison: 10/05/2017

CLINICAL DATA: Post left total hip replacement

EXAM:
PORTABLE PELVIS 1-2 VIEWS

[pelvis ap]
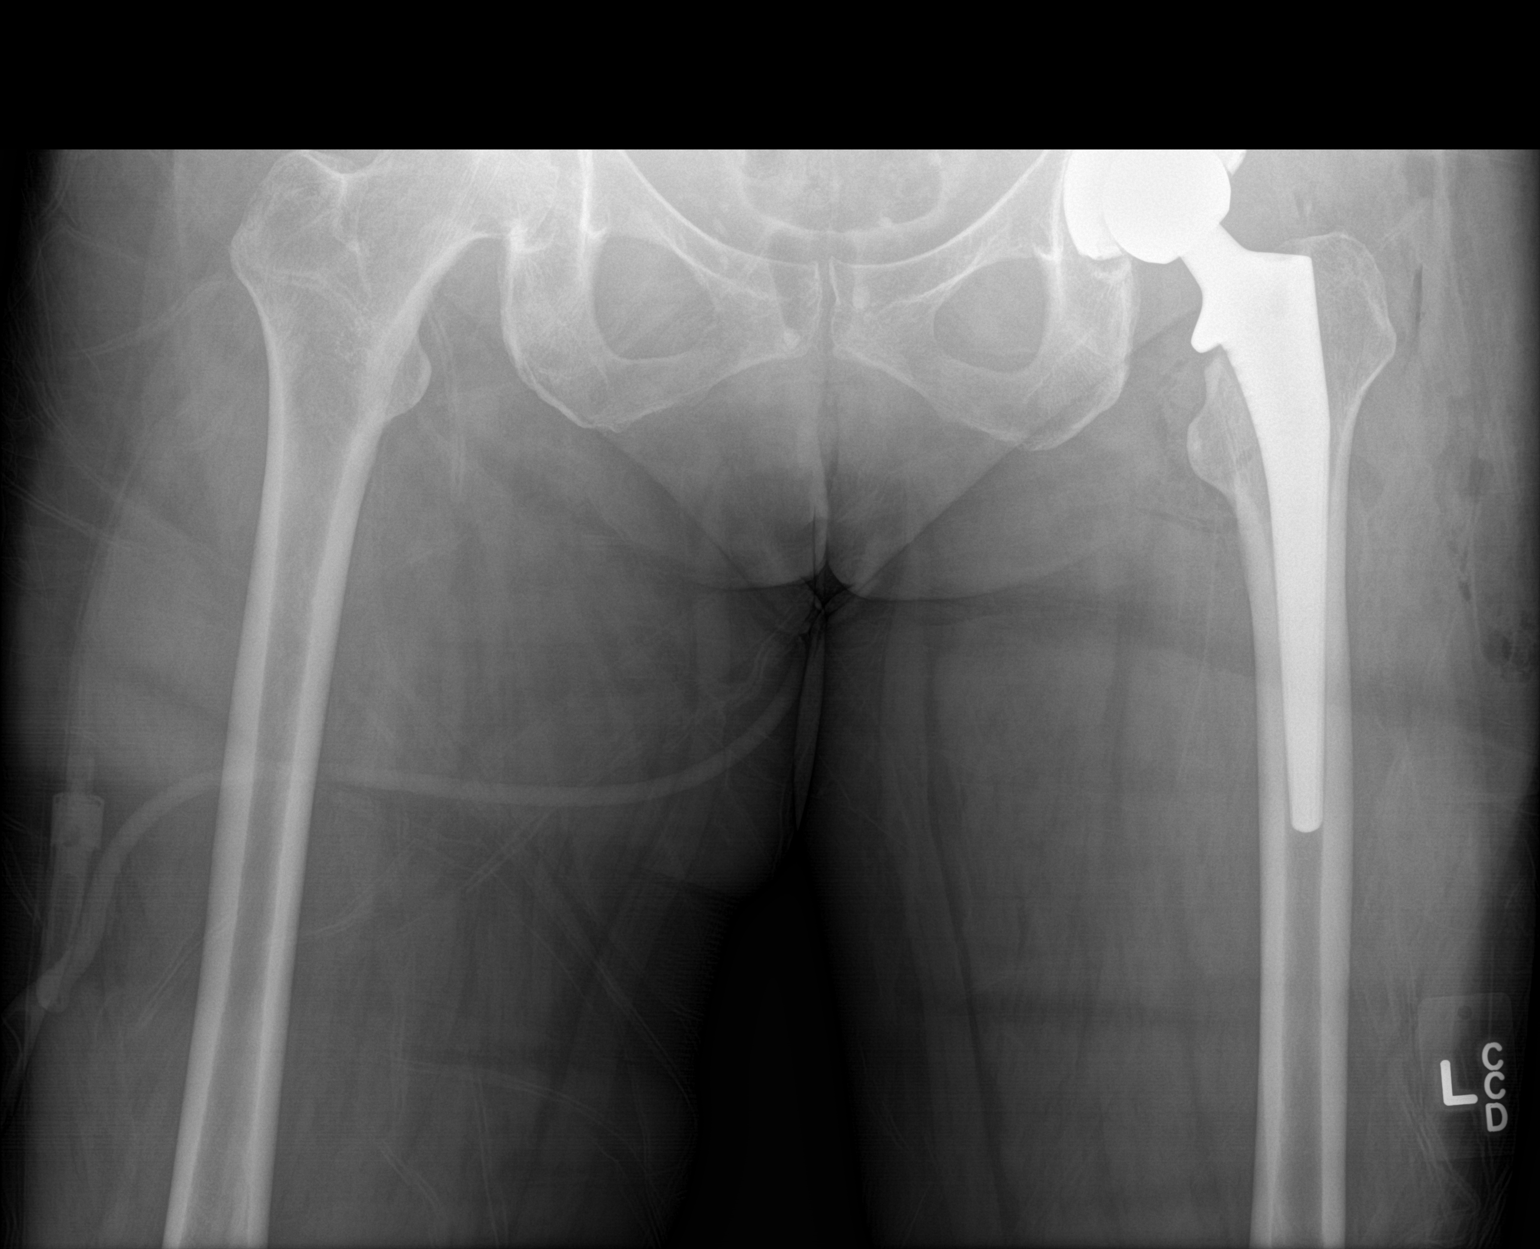

[1 of 1 positions shown; findings below may reference images not displayed]

FINDINGS: Changes of left hip replacement. Normal AP alignment. No hardware or
bony complicating feature.
IMPRESSION: Left hip replacement.  No complicating feature.

## 2018-08-15 ENCOUNTER — Other Ambulatory Visit (HOSPITAL_COMMUNITY): Payer: Self-pay | Admitting: *Deleted

## 2018-08-15 MED ORDER — METOPROLOL SUCCINATE ER 25 MG PO TB24: 25.0000 mg | ORAL_TABLET | Freq: Every day | ORAL | 2 refills | Status: DC

## 2018-10-01 ENCOUNTER — Other Ambulatory Visit (HOSPITAL_COMMUNITY): Payer: Self-pay | Admitting: *Deleted

## 2018-10-01 MED ORDER — APIXABAN 5 MG PO TABS: 5.0000 mg | ORAL_TABLET | Freq: Two times a day (BID) | ORAL | 2 refills | Status: DC

## 2018-10-08 ENCOUNTER — Encounter: Payer: Self-pay | Admitting: Oncology

## 2018-10-09 ENCOUNTER — Other Ambulatory Visit: Payer: Self-pay | Admitting: Oncology

## 2018-10-09 NOTE — Progress Notes (Signed)
The patient called today stating that her insurance will not pay for diagnostic mammography even though she is still within the 5-year window that we usually obtain diagnostic mammography 4.  She requests that we place a new order for screening mammography.  However she has already had her mammogram, done August 13, 2018.  I am asking our billing people to help the patient deal with her insurance company on this issue.  Again, diagnostic mammography is routine and standard of care for the first 5 years after diagnosis of breast cancer

## 2018-10-17 ENCOUNTER — Encounter: Payer: Self-pay | Admitting: *Deleted

## 2018-12-05 ENCOUNTER — Encounter (INDEPENDENT_AMBULATORY_CARE_PROVIDER_SITE_OTHER): Payer: Self-pay | Admitting: Orthopaedic Surgery

## 2018-12-12 ENCOUNTER — Ambulatory Visit (INDEPENDENT_AMBULATORY_CARE_PROVIDER_SITE_OTHER): Payer: 59 | Admitting: Orthopaedic Surgery

## 2018-12-12 ENCOUNTER — Encounter (INDEPENDENT_AMBULATORY_CARE_PROVIDER_SITE_OTHER): Payer: Self-pay | Admitting: Orthopaedic Surgery

## 2018-12-12 DIAGNOSIS — M25551 Pain in right hip: Secondary | ICD-10-CM | POA: Diagnosis not present

## 2018-12-12 DIAGNOSIS — M1611 Unilateral primary osteoarthritis, right hip: Secondary | ICD-10-CM

## 2018-12-12 MED ORDER — LIDOCAINE HCL (PF) 1 % IJ SOLN
5.0000 mL | INTRAMUSCULAR | Status: AC | PRN
  Administered 2018-12-12: 5 mL

## 2018-12-12 MED ORDER — METHYLPREDNISOLONE ACETATE 40 MG/ML IJ SUSP
40.0000 mg | INTRAMUSCULAR | Status: AC | PRN
  Administered 2018-12-12: 40 mg via INTRA_ARTICULAR

## 2018-12-12 NOTE — Progress Notes (Signed)
Office Visit Note   Patient: Caroline [name]           Date of Birth: [birth year]           MRN: [id number] Visit Date: 12/12/2018 Requested by: Aura Dials, PA-C [place], [place] [id number] PCP: Selinda Orion  Subjective: Chief Complaint  Patient presents with  . Right Hip - Pain    HPI: She is a 62 year old with right hip pain.  Symptoms started a couple months ago.  She has had problems in the past but it got better after she had her left hip replaced.  Pain in the anterolateral hip when she stands up from seated position.              ROS: Noncontributory  Objective: Vital Signs: LMP 11/25/2010   Physical Exam:  Right hip: Still has good range of motion with internal/external rotation, but it hurts with flexion passively and with internal rotation.  Imaging: None today.  Assessment & Plan: 1.  Right hip pain probably due to DJD -Ultrasound-guided injection today.  Follow-up in 1 month with Dr. Ninfa Linden.   Follow-Up Instructions: No follow-ups on file.      Procedures: Large Joint Inj: R hip joint on 12/12/2018 11:38 AM Indications: pain Details: 22 G 3.5 in needle, ultrasound-guided anterolateral approach  Arthrogram: No  Medications: 5 mL lidocaine (PF) 1 %; 40 mg methylPREDNISolone acetate 40 MG/ML  She had good relief during the immediate anesthetic phase. Procedure, treatment alternatives, risks and benefits explained, specific risks discussed. Consent was given by the patient. Immediately prior to procedure a time out was called to verify the correct patient, procedure, equipment, support staff and site/side marked as required. Patient was prepped and draped in the usual sterile fashion.      No notes on file    PMFS History: Patient Active Problem List   Diagnosis Date Noted  . Atrial fibrillation by electrocardiogram ([facility]) 01/10/2018  . Status post total replacement of left hip 12/01/2017  . Pain in left hip  10/05/2017  . Unilateral primary osteoarthritis, left hip 10/05/2017  . 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 Hyperlipidemia 01/03/2013  . Chest pain 11/26/2011  . Tobacco abuse 11/26/2011  . HTN (hypertension) 11/26/2011   Past Medical History:  Diagnosis Date  . Arthritis    hips  . Cancer ([facility])    Left breast  . History of kidney stones   . Hypertension   . Paroxysmal atrial fibrillation ([facility])   . S/P radiation therapy 10/10/17- 11/22/17   Right Breast/ 50 gy in 25 fractions, Right Breast boost/ 10 Gy in 5 fractions.    Family History  Problem Relation Age of Onset  . Breast cancer Cousin     Past Surgical History:  Procedure Laterality Date  . BREAST BIOPSY    . BREAST EXCISIONAL BIOPSY    . BREAST LUMPECTOMY Right 08/2017  . BREAST LUMPECTOMY WITH RADIOACTIVE SEED AND SENTINEL LYMPH NODE BIOPSY Right 08/17/2017   Procedure: RIGHT BREAST RADIOACTIVE SEED LOCALIZED LUMPECTOMY AND SENTINEL LYMPH NODE BIOPSY;  Surgeon: Erroll Luna, MD;  Location: [facility];  Service: General;  Laterality: Right;  . CESAREAN SECTION  [phone]  . COLONOSCOPY    . HEMORRHOID SURGERY     20 years ago  . RE-EXCISION OF BREAST LUMPECTOMY Right 09/06/2017   Procedure: RE-EXCISION OF RIGHT BREAST LUMPECTOMY;  Surgeon: Erroll Luna, MD;  Location: [facility];  Service: General;  Laterality: Right;  . TOE SURGERY Right    5th 20 years ago  . TOTAL HIP ARTHROPLASTY Left 12/01/2017   Procedure: LEFT TOTAL HIP ARTHROPLASTY ANTERIOR APPROACH;  Surgeon: Mcarthur Rossetti, MD;  [facility];  Service: Orthopedics;  Laterality: Left;   Social History   Occupational History  . Not on file  Tobacco Use  . Smoking status: Current Every Day Smoker    Packs/day: 0.50    Years: 40.00    Pack years: 20.00    Types: Cigarettes  . Smokeless tobacco: Never Used  Substance and Sexual  Activity  . Alcohol use: Yes    Alcohol/week: 7.0 standard drinks    Types: 7 Glasses of wine per week    Comment: wine nightly  . Drug use: No  . Sexual activity: Yes    Birth control/protection: Post-menopausal

## 2018-12-31 ENCOUNTER — Ambulatory Visit (HOSPITAL_COMMUNITY)
Admission: RE | Admit: 2018-12-31 | Discharge: 2018-12-31 | Disposition: A | Discharge status: Home / Self Care | Payer: 59 | Source: Ambulatory Visit | Attending: Nurse Practitioner | Admitting: Nurse Practitioner

## 2018-12-31 ENCOUNTER — Encounter (HOSPITAL_COMMUNITY): Payer: Self-pay | Admitting: Nurse Practitioner

## 2018-12-31 VITALS — BP 100/64 | HR 70 | Ht 64.0 in | Wt 181.0 lb

## 2018-12-31 DIAGNOSIS — Z7901 Long term (current) use of anticoagulants: Secondary | ICD-10-CM | POA: Diagnosis not present

## 2018-12-31 DIAGNOSIS — Z853 Personal history of malignant neoplasm of breast: Secondary | ICD-10-CM | POA: Diagnosis not present

## 2018-12-31 DIAGNOSIS — Z923 Personal history of irradiation: Secondary | ICD-10-CM | POA: Diagnosis not present

## 2018-12-31 DIAGNOSIS — I480 Paroxysmal atrial fibrillation: Secondary | ICD-10-CM | POA: Diagnosis not present

## 2018-12-31 DIAGNOSIS — Z87442 Personal history of urinary calculi: Secondary | ICD-10-CM | POA: Diagnosis not present

## 2018-12-31 DIAGNOSIS — Z79899 Other long term (current) drug therapy: Secondary | ICD-10-CM | POA: Insufficient documentation

## 2018-12-31 DIAGNOSIS — F17210 Nicotine dependence, cigarettes, uncomplicated: Secondary | ICD-10-CM | POA: Diagnosis not present

## 2018-12-31 DIAGNOSIS — Z96641 Presence of right artificial hip joint: Secondary | ICD-10-CM | POA: Insufficient documentation

## 2018-12-31 NOTE — Progress Notes (Signed)
Primary Care Physician: Selinda Orion Referring Physician: [facility]   Caroline [name] is a 63 y.o. female with a h/o paroxysmal afib that was diagnosed in the [facility] ER and had f/u with Chanetta Marshall, NP, in the afib f/u. Echo done and without significant findings.She is on metoprolol ER and Xarelto 20 mg qd for a chadsvasc score of 2(htn, female).  She is in afib clinic for f/u and co/ of almost daily palpitations, mostly worse at night. She denies any snoring/apnea. She continues to smoke. She will take occasional BB for palpitations. Continues on xarelto.  F/u in afib clinic, 01/09/18. Since I saw her last she has had 2 lumpectomies and radiation for breat cancer. She also just had rt hip replacement 12/6 and is recovering nicely.She is not aware of any  issues with afib during this last year. No issues with bleeding with eliquis. She is also retired at this point, having been let go of her job of 30 years.  F/u in afib clinic 12/31/2018. She is in for one year f/u. No insignificant palpitations since being seen. She continues on  BB and eliquis but now by guidelines of a CHA2DS2VASc of 3 for females to be on anticoagulation long term and no significant palpitations, she is ok to stop anticoagulation. This suits the pt as she will be finishing her severance package and will not have insurance in a few weeks and not be able to afford eliquis.   Today, she denies symptoms of  chest pain, shortness of breath, orthopnea, PND, lower extremity edema, dizziness, presyncope, syncope, or neurologic sequela. The patient is tolerating medications without difficulties and is otherwise without complaint today.   Past Medical History:  Diagnosis Date  . Arthritis    hips  . Cancer ([facility])    Left breast  . History of kidney stones   . Hypertension   . Paroxysmal atrial fibrillation (H[facility])   . S/P radiation therapy 10/10/17- 11/22/17   Right Breast/ 50 gy in 25 fractions, Right Breast boost/ 10 Gy  in 5 fractions.   Past Surgical History:  Procedure Laterality Date  . BREAST BIOPSY    . BREAST EXCISIONAL BIOPSY    . BREAST LUMPECTOMY Right 08/2017  . BREAST LUMPECTOMY WITH RADIOACTIVE SEED AND SENTINEL LYMPH NODE BIOPSY Right 08/17/2017   Procedure: RIGHT BREAST RADIOACTIVE SEED LOCALIZED LUMPECTOMY AND SENTINEL LYMPH NODE BIOPSY;  Surgeon: Erroll Luna, MD;  Location: [facility];  Service: General;  Laterality: Right;  . CESAREAN SECTION  [phone]  . COLONOSCOPY    . HEMORRHOID SURGERY     20 years ago  . RE-EXCISION OF BREAST LUMPECTOMY Right 09/06/2017   Procedure: RE-EXCISION OF RIGHT BREAST LUMPECTOMY;  Surgeon: Erroll Luna, MD;  Location: [facility];  Service: General;  Laterality: Right;  . TOE SURGERY Right    5th 20 years ago  . TOTAL HIP ARTHROPLASTY Left 12/01/2017   Procedure: LEFT TOTAL HIP ARTHROPLASTY ANTERIOR APPROACH;  Surgeon: Mcarthur Rossetti, MD;  [facility];  Service: Orthopedics;  Laterality: Left;    Current Outpatient Medications  Medication Sig Dispense Refill  . buPROPion (WELLBUTRIN XL) 150 MG 24 hr tablet Take 150 mg by mouth daily.    . fenofibrate 160 MG tablet Take 160 mg by mouth daily.    Marland Kitchen HYDROcodone-acetaminophen (NORCO/VICODIN) 5-325 MG tablet Take 1-2 tablets by mouth 2 (two) times daily as needed for moderate pain. 30 tablet 0  .  metoprolol succinate (TOPROL-XL) 25 MG 24 hr tablet Take 1 tablet (25 mg total) by mouth daily. 90 tablet 2  . tamoxifen (NOLVADEX) 20 MG tablet Take 1 tablet (20 mg total) by mouth daily. 90 tablet 4  . valsartan-hydrochlorothiazide (DIOVAN-HCT) 160-12.5 MG tablet Take 1 tablet by mouth daily.     No current facility-administered medications for this encounter.     Allergies  Allergen Reactions  . Sulfa Antibiotics Other (See Comments)    Unknown, was child    Social History   Socioeconomic History  . Marital status: Married    Spouse name: Not on  file  . Number of children: Not on file  . Years of education: Not on file  . Highest education level: Not on file  Occupational History  . Not on file  Social Needs  . Financial resource strain: Not on file  . Food insecurity:    Worry: Not on file    Inability: Not on file  . Transportation needs:    Medical: Not on file    Non-medical: Not on file  Tobacco Use  . Smoking status: Current Every Day Smoker    Packs/day: 0.50    Years: 40.00    Pack years: 20.00    Types: Cigarettes  . Smokeless tobacco: Never Used  Substance and Sexual Activity  . Alcohol use: Yes    Alcohol/week: 7.0 standard drinks    Types: 7 Glasses of wine per week    Comment: wine nightly  . Drug use: No  . Sexual activity: Yes    Birth control/protection: Post-menopausal  Lifestyle  . Physical activity:    Days per week: Not on file    Minutes per session: Not on file  . Stress: Not on file  Relationships  . Social connections:    Talks on phone: Not on file    Gets together: Not on file    Attends religious service: Not on file    Active member of club or organization: Not on file    Attends meetings of clubs or organizations: Not on file    Relationship status: Not on file  . Intimate partner violence:    Fear of current or ex partner: Not on file    Emotionally abused: Not on file    Physically abused: Not on file    Forced sexual activity: Not on file  Other Topics Concern  . Not on file  Social History Narrative  . Not on file    Family History  Problem Relation Age of Onset  . Breast cancer Cousin     ROS- All systems are reviewed and negative except as per the HPI above  Physical Exam: Vitals:   12/31/18 1122  BP: 100/64  Pulse: 70  Weight: 82.1 kg  Height: 5\' 4"  (1.626 m)    GEN- The patient is well appearing, alert and oriented x 3 today.   Head- normocephalic, atraumatic Eyes-  Sclera clear, conjunctiva pink Ears- hearing intact Oropharynx- clear Neck- supple,  no JVP Lymph- no cervical lymphadenopathy Lungs- Clear to ausculation bilaterally, normal work of breathing Heart- Regular rate and rhythm, no murmurs, rubs or gallops, PMI not laterally displaced GI- soft, NT, ND, + BS Extremities- no clubbing, cyanosis, or edema MS- no significant deformity or atrophy Skin- no rash or lesion Psych- euthymic mood, full affect Neuro- strength and sensation are intact  EKG- NSR at 88 bpm, pr int 142 ms, qrs int 80 bpm, qtc 471 ms Epic records  reviewed Echo-  Assessment and Plan: 1. H/o  Paroxysmal atrial fibrillation  No awareness of irregular heart beat for the last year, usually aware when out of rythm According to 2019 afib guidelines, a female needs two other risk factors to justify eliquis long term and she just has one(htn). Will stop  eliquis.  But asked to keep some on hand if she does go back into sustained  afib, she will restart and f/u here. Will continue metoprolol succinate   2.Casual smoking  Advised cessation  F/u in afib clinic as needed  Butch Penny C. Daesha Insco, [facility] [address] [place], [place] [address] [phone]

## 2018-12-31 NOTE — Patient Instructions (Signed)
Stop eliquis

## 2019-01-02 ENCOUNTER — Ambulatory Visit (INDEPENDENT_AMBULATORY_CARE_PROVIDER_SITE_OTHER): Payer: 59 | Admitting: Orthopaedic Surgery

## 2019-01-09 ENCOUNTER — Ambulatory Visit (INDEPENDENT_AMBULATORY_CARE_PROVIDER_SITE_OTHER): Payer: 59 | Admitting: Orthopaedic Surgery

## 2019-02-13 ENCOUNTER — Other Ambulatory Visit (HOSPITAL_COMMUNITY): Payer: Self-pay | Admitting: Nurse Practitioner

## 2019-03-12 ENCOUNTER — Other Ambulatory Visit: Payer: Self-pay | Admitting: Oncology

## 2019-06-25 ENCOUNTER — Telehealth (HOSPITAL_COMMUNITY): Payer: Self-pay | Admitting: *Deleted

## 2019-06-25 MED ORDER — METOPROLOL SUCCINATE ER 25 MG PO TB24: 25.0000 mg | ORAL_TABLET | Freq: Two times a day (BID) | ORAL | 2 refills | Status: DC

## 2019-06-25 NOTE — Telephone Encounter (Signed)
Patient called in stating she has been having some intermittent fluttering and she increased her metoprolol to twice a day. She hasn't had anymore fluttering since and would like to stay on the increased dose. Discussed with Roderic Palau NP - will continue at current dose since patient is tolerating well.

## 2019-07-17 ENCOUNTER — Other Ambulatory Visit: Payer: Self-pay | Admitting: Oncology

## 2019-07-17 DIAGNOSIS — Z9889 Other specified postprocedural states: Secondary | ICD-10-CM

## 2019-08-09 ENCOUNTER — Other Ambulatory Visit: Payer: Self-pay

## 2019-08-09 ENCOUNTER — Ambulatory Visit
Admission: RE | Admit: 2019-08-09 | Discharge: 2019-08-09 | Disposition: A | Discharge status: Home / Self Care | Payer: Self-pay | Source: Ambulatory Visit | Attending: Oncology | Admitting: Oncology

## 2019-08-09 DIAGNOSIS — Z9889 Other specified postprocedural states: Secondary | ICD-10-CM

## 2019-08-09 HISTORY — DX: Personal history of irradiation: Z92.3

## 2020-01-23 ENCOUNTER — Other Ambulatory Visit: Payer: Self-pay | Admitting: Oncology

## 2020-01-23 ENCOUNTER — Telehealth: Payer: Self-pay | Admitting: *Deleted

## 2020-01-23 NOTE — Telephone Encounter (Signed)
This RN refilled tamoxifen per pharmacy request.  Pt does not have a follow up appointment.  Request for appointment sent to scheduling.

## 2020-02-03 NOTE — Progress Notes (Signed)
[place]  Telephone:([phone]) [phone] Fax:(336) [phone]     ID: Caroline [name] DOB: 28-Apr-1956  MR#: [id number]  [id number]#:[id number]  Patient Care Team: Selinda Orion as PCP - General (Physician Assistant) Annamay Laymon, Virgie Dad, MD as Consulting Physician (Oncology) Erroll Luna, MD as Consulting Physician (General Surgery) Haygood, Seymour Bars, MD (Inactive) as Consulting Physician (Obstetrics and Gynecology) Donzetta Sprung., MD as Physician Assistant (Sports Medicine) Eppie Gibson, MD as Attending Physician (Radiation Oncology) Juanita Craver, MD as Consulting Physician (Gastroenterology) Thompson Grayer, MD as Consulting Physician (Cardiology) Mcarthur Rossetti, MD as Consulting Physician (Orthopedic Surgery) Chauncey Cruel, MD OTHER MD:  CHIEF COMPLAINT: Estrogen receptor positive breast cancer  CURRENT TREATMENT: tamoxifen   INTERVAL HISTORY: Caroline [name] returns today for follow-up of her estrogen receptor positive breast cancer.   She continues on tamoxifen.  She has mild hot flashes mostly during the day.  They do not wake her up at night.  She does not have vaginal wetness issues.  Her most recent mammography since her last visit was bilateral diagnostic mammogram on 08/09/2019 showing: breast density category C; no evidence of malignancy in either breast.   REVIEW OF SYSTEMS: Caroline [name] woke up January 16, 2020 with a terrific pain in her hip, which she thought was a kidney stone.  Took her to the emergency room where CT of the abdomen and pelvis without contrast (stone protocol) did not show a stone.  It also did not show any evidence of metastatic disease.  She received Dilaudid and was sent home and is currently taking hydrocodone.  She is not constipated from this medication.  She has an MRI pending at [facility] to explore this further.  She is at present not able to exercise because of the pain in her hip.  She is looking forward to May when her daughter  will be married and Caroline [name]'s yard.  They are ready quite busy planning the wedding.  A detailed review of systems today was otherwise stable.  HISTORY OF CURRENT ILLNESS: From the original intake note:  Caroline [name] (last name is pronounced muh-[id number]) had routine screening mammography with tomography at the [facility] 07/21/2017, showing the breast density to be category C. An area of possible distortion was noted in the right breast, and on 07/28/2017 she underwent right diagnostic mammography with tomography and right breast ultrasonography. This confirmed an area of distortion in the outer right breast which by exam showed acid focal thickening at the 8:30 position, 4 cm from the nipple. Ultrasound showed 3 separate irregular hypoechoic areas in this region, measuring 0.9, 0.7 and 0.5 cm. Taken together these 2 areas at up to 2.1 cm in greatest diameter. There was no abnormal right axillary adenopathy.  2 of these masses were biopsied 08/02/2017. The final pathology ([id number]) showed both to consist of invasive ductal carcinoma, grade 2 with the prognostic panel (from the lesion at 8:30 o'clock 4 cm from the nipple) was estrogen receptor 90% positive, progesterone receptor 20% positive, both with strong staining intensity, with an MIB-1 of 15%, and no HER-2 complication, the signals ratio being 1.00 and the number per cell 1.05.  The patient proceeded to right lumpectomy and axillary sentinel lymph node sampling 08/17/2017, with the final pathology ([id number]) showing 2 areas of invasive ductal carcinoma, grade 2, measuring 1.5 and 0.7 cm. Margins were broadly positive medially and focally laterally. The single sentinel lymph node was involved by tumor.  The patient's subsequent history is as detailed below.  PAST MEDICAL HISTORY: Past Medical History:  Diagnosis Date  . Arthritis    hips  . Cancer ([place])    Left breast  . History of kidney stones   . Hypertension   . Paroxysmal atrial  fibrillation (H[facility])   . Personal history of radiation therapy   . S/P radiation therapy 10/10/17- 11/22/17   Right Breast/ 50 gy in 25 fractions, Right Breast boost/ 10 Gy in 5 fractions.    PAST SURGICAL HISTORY: Past Surgical History:  Procedure Laterality Date  . BREAST BIOPSY Right 08/02/2017  . BREAST LUMPECTOMY Right 08/17/2017  . BREAST LUMPECTOMY WITH RADIOACTIVE SEED AND SENTINEL LYMPH NODE BIOPSY Right 08/17/2017   Procedure: RIGHT BREAST RADIOACTIVE SEED LOCALIZED LUMPECTOMY AND SENTINEL LYMPH NODE BIOPSY;  Surgeon: Erroll Luna, MD;  Location: [facility];  Service: General;  Laterality: Right;  . CESAREAN SECTION  [phone]  . COLONOSCOPY    . HEMORRHOID SURGERY     20 years ago  . RE-EXCISION OF BREAST LUMPECTOMY Right 09/06/2017   Procedure: RE-EXCISION OF RIGHT BREAST LUMPECTOMY;  Surgeon: Erroll Luna, MD;  Location: [facility];  Service: General;  Laterality: Right;  . TOE SURGERY Right    5th 20 years ago  . TOTAL HIP ARTHROPLASTY Left 12/01/2017   Procedure: LEFT TOTAL HIP ARTHROPLASTY ANTERIOR APPROACH;  Surgeon: Mcarthur Rossetti, MD;  Location: [facility];  Service: Orthopedics;  Laterality: Left;    FAMILY HISTORY Family History  Problem Relation Age of Onset  . Breast cancer Cousin   The patient's father died from dementia at the age of 35. The patient's mother died from emphysema at the age of 4. The patient has 2 brothers, no sisters. On the paternal side a first cousin was diagnosed with breast cancer at the age of 39. There is no other breast ovarian or prostate cancer in the family but the patient is aware it is a small family. The patient's brother living in [place] had a melanoma diagnosed in his 61s   GYNECOLOGIC HISTORY:  Patient's last menstrual period was 11/25/2010. Menarche age 68, first live birth age 40, she is [name] P3. The middle child was stillborn. She stopped having periods in her late 4s. She did not  take hormone replacement. She took oral contraceptives for approximately 15 years remotely with no complications.   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 Daughter Benjie Karvonen graduated from [employer] in exercise and sports signs.  She will be getting married May 2021.  Son Ellard Artis is currently working with his father.  He is 36 as of February 2021.  The patient is not a church attender.    ADVANCED DIRECTIVES: In the absence of any documents to the contrary the patient's husband is her healthcare power of attorney   HEALTH MAINTENANCE: Social History   Tobacco Use  . Smoking status: Current Every Day Smoker    Packs/day: 0.50    Years: 40.00    Pack years: 20.00    Types: Cigarettes  . Smokeless tobacco: Never Used  Substance Use Topics  . Alcohol use: Yes    Alcohol/week: 7.0 standard drinks    Types: 7 Glasses of wine per week    Comment: wine nightly  . Drug use: No     Colonoscopy:/ [facility]  PAP:  Bone density: remote   Allergies  Allergen Reactions  . Sulfa Antibiotics Other (See  Comments)    Unknown, was child    Current Outpatient Medications  Medication Sig Dispense Refill  . buPROPion (WELLBUTRIN XL) 150 MG 24 hr tablet Take 150 mg by mouth daily.    . fenofibrate 160 MG tablet Take 160 mg by mouth daily.    Marland Kitchen HYDROcodone-acetaminophen (NORCO/VICODIN) 5-325 MG tablet Take 1-2 tablets by mouth 2 (two) times daily as needed for moderate pain. 30 tablet 0  . metoprolol succinate (TOPROL-XL) 25 MG 24 hr tablet Take 1 tablet (25 mg total) by mouth 2 (two) times daily. 180 tablet 2  . tamoxifen (NOLVADEX) 20 MG tablet TAKE ONE TABLET BY MOUTH DAILY 60 tablet 3  . valsartan-hydrochlorothiazide (DIOVAN-HCT) 160-12.5 MG tablet Take 1 tablet by mouth daily.     No current facility-administered medications for this visit.    OBJECTIVE:Middle-aged white woman in no acute  distress  Vitals:   02/04/20 1420  BP: 115/80  Pulse: 88  Resp: 18  Temp: 98.5 F (36.9 C)  SpO2: 93%     Body mass index is 31.7 kg/m.   Wt Readings from Last 3 Encounters:  02/04/20 184 lb 11.2 oz (83.8 kg)  12/31/18 181 lb (82.1 kg)  01/10/18 170 lb 3.2 oz (77.2 kg)      ECOG FS:1 - Symptomatic but completely ambulatory  Sclerae unicteric, EOMs intact Wearing a mask No cervical or supraclavicular adenopathy Lungs no rales or rhonchi Heart regular rate and rhythm Abd soft, nontender, positive bowel sounds MSK no focal spinal tenderness, no upper extremity lymphedema Neuro: nonfocal, well oriented, appropriate affect Breasts: The right breast is status post lumpectomy and radiation.  There is mild distortion of the contour.  There is no evidence of local recurrence.  The left breast is benign.  Both axillae are benign.   LAB RESULTS:   CMP     Component Value Date/Time   NA 142 01/10/2018 1307   NA 140 08/31/2017 1616   K 4.3 01/10/2018 1307   K 3.9 08/31/2017 1616   CL 105 01/10/2018 1307   CO2 28 01/10/2018 1307   CO2 27 08/31/2017 1616   GLUCOSE 134 01/10/2018 1307   GLUCOSE 139 08/31/2017 1616   BUN 34 (H) 01/10/2018 1307   BUN 45.6 (H) 08/31/2017 1616   CREATININE 1.18 (H) 01/10/2018 1307   CREATININE 1.6 (H) 08/31/2017 1616   CALCIUM 9.3 01/10/2018 1307   CALCIUM 10.0 08/31/2017 1616   PROT 6.7 01/10/2018 1307   PROT 7.2 08/31/2017 1616   ALBUMIN 3.6 01/10/2018 1307   ALBUMIN 3.5 08/31/2017 1616   AST 12 01/10/2018 1307   AST 12 08/31/2017 1616   ALT 10 01/10/2018 1307   ALT 14 08/31/2017 1616   ALKPHOS 81 01/10/2018 1307   ALKPHOS 76 08/31/2017 1616   BILITOT 0.3 01/10/2018 1307   BILITOT 0.25 08/31/2017 1616   GFRNONAA 49 (L) 01/10/2018 1307   GFRAA 57 (L) 01/10/2018 1307    No results found for: TOTALPROTELP, ALBUMINELP, A1GS, A2GS, BETS, BETA2SER, GAMS, MSPIKE, SPEI  No results found for: KPAFRELGTCHN, LAMBDASER, [facility]  Lab  Results  Component Value Date   WBC 5.6 02/04/2020   NEUTROABS 3.4 02/04/2020   HGB 12.7 02/04/2020   HCT 38.4 02/04/2020   MCV 96.7 02/04/2020   PLT 192 02/04/2020      Chemistry      Component Value Date/Time   NA 142 01/10/2018 1307   NA 140 08/31/2017 1616   K 4.3 01/10/2018 1307   K 3.9 08/31/2017  1616   CL 105 01/10/2018 1307   CO2 28 01/10/2018 1307   CO2 27 08/31/2017 1616   BUN 34 (H) 01/10/2018 1307   BUN 45.6 (H) 08/31/2017 1616   CREATININE 1.18 (H) 01/10/2018 1307   CREATININE 1.6 (H) 08/31/2017 1616      Component Value Date/Time   CALCIUM 9.3 01/10/2018 1307   CALCIUM 10.0 08/31/2017 1616   ALKPHOS 81 01/10/2018 1307   ALKPHOS 76 08/31/2017 1616   AST 12 01/10/2018 1307   AST 12 08/31/2017 1616   ALT 10 01/10/2018 1307   ALT 14 08/31/2017 1616   BILITOT 0.3 01/10/2018 1307   BILITOT 0.25 08/31/2017 1616       No results found for: LABCA2  No components found for: WHQPRF163  No results for input(s): INR in the last 168 hours.  No results found for: LABCA2  No results found for: WGY659  No results found for: DJT701  No results found for: XBL390  No results found for: CA2729  No components found for: HGQUANT  No results found for: CEA1 / No results found for: CEA1   No results found for: AFPTUMOR  No results found for: CHROMOGRNA  No results found for: HGBA, HGBA2QUANT, HGBFQUANT, HGBSQUAN (Hemoglobinopathy evaluation)   No results found for: LDH  No results found for: IRON, TIBC, IRONPCTSAT (Iron and TIBC)  No results found for: FERRITIN  Urinalysis No results found for: COLORURINE, APPEARANCEUR, LABSPEC, PHURINE, GLUCOSEU, HGBUR, BILIRUBINUR, KETONESUR, PROTEINUR, UROBILINOGEN, NITRITE, LEUKOCYTESUR   STUDIES:  Result Impression  IMPRESSION:  No evidence of any definite renal or ureteral calculi. No hydronephrosis. No definite acute findings seen in the abdomen or pelvis.       Electronically Signed by: Daiva Eves   Result Narrative  INDICATION: flank pain. COMPARISON: CT renal stone study 08/22/2011; abdominal ultrasound 04/23/2019  TECHNIQUE: Routine CT of the abdomen and pelvis was performed without intravenous or oral contrast per renal calculus evaluation protocol. Evaluation potential of major abdominal viscera and vascular structures is somewhat reduced in the absence of  contrast. This exam is intended to evaluate for presence of renal calculi and does not adequately assess for renal or urothelial malignancies. Radiation dose reduction was utilized (automated exposure control, mA or kV adjustment based on patient size,  or iterative image reconstruction).  FINDINGS:  UROLOGIC: - Kidneys/ureters: No renal calculi. No hydronephrosis. - Urinary bladder:Normal.  SOLID VISCERA: - Liver: No acute abnormality seen. - Gallbladder: Present - Adrenal glands: Normal. - Pancreas: Normal. - Spleen: Normal.  GI: - No bowel obstruction. No acute inflammatory bowel process seen. -  PERITONEAL CAVITY/RETROPERITONEUM: - No free fluid. - No pneumoperitoneum. - No lymphadenopathy.  PELVIS: - No acute abnormalities.  VISUALIZED LOWER THORAX: - No acute abnormalities.  MUSCULOSKELETAL: - No definite destructive osseous processes. Degenerative changes in the spine.  MISC: - N/A or as above.  Other Result Information  Acute Interface, Incoming Rad Results - 01/16/2020 10:58 AM EST INDICATION: flank pain. COMPARISON:  CT renal stone study 08/22/2011; abdominal ultrasound 04/23/2019   TECHNIQUE: Routine CT of the abdomen and pelvis was performed without intravenous or oral contrast per renal calculus evaluation protocol. Evaluation potential of major abdominal viscera and vascular structures is somewhat reduced in the absence of  contrast. This exam is intended to evaluate for presence of renal calculi and does not adequately assess for renal or urothelial malignancies. Radiation dose reduction was  utilized (automated exposure control, mA or kV adjustment based on patient size,  or  iterative Hydrographic surveyor).  FINDINGS:  UROLOGIC: - Kidneys/ureters: No renal calculi. No hydronephrosis. - Urinary bladder:Normal.  SOLID VISCERA: - Liver: No acute abnormality seen. - Gallbladder: Present - Adrenal glands: Normal. - Pancreas: Normal. - Spleen: Normal.  GI: - No bowel obstruction. No acute inflammatory bowel process seen. -  PERITONEAL CAVITY/RETROPERITONEUM: - No free fluid. - No pneumoperitoneum. - No lymphadenopathy.  PELVIS: - No acute abnormalities.  VISUALIZED LOWER THORAX: - No acute abnormalities.  MUSCULOSKELETAL: - No definite destructive osseous processes. Degenerative changes in the spine.  MISC: - N/A or as above.   IMPRESSION:  No evidence of any definite renal or ureteral calculi. No hydronephrosis. No definite acute findings seen in the abdomen or pelvis.        Electronically Signed by: Daiva Eves  Status     ELIGIBLE FOR AVAILABLE RESEARCH PROTOCOL: no  ASSESSMENT: 64 y.o. [place], [place] woman status post right breast upper outer quadrant biopsy 08/02/2017 for a clinical T1c No invasive ductal carcinoma, grade 2, estrogen and progesterone receptor positive, HER-2 not amplified, with an MIB-1 of 15%.  (1) status post right lumpectomy and axillary sentinel lymph node sampling 08/17/2017 for an mpT1c pN1, stage IB invasive ductal carcinoma, grade 2, with positive margins  (2) additional surgery 09/06/2017 successfully cleared margins.  (3) Mammaprint report shows "low-risk " tumor, predicting a good prognosis without chemotherapy and no significant benefit from chemotherapy  (4) adjuvant radiation completed 11/22/2017 Site/dose: 1) Right Breast / 50 Gy in 25 fractions  2) Right Breast Boost / 10 Gy in 5 fractions  (5) tamoxifen started 10/04/2017  (6) genetics testing scheduling requested 01/10/2018  (7) paroxysmal  atrial fibrillation: On lifelong anticoagulation (apixaban)   PLAN: Tressa is now 2-1/2 years out from definitive surgery for breast cancer with no evidence of disease recurrence.  This is very favorable.  She is tolerating tamoxifen well the plan will be to continue that a total of 5 years.  She is having hip issues at present.  She is going to a pain clinic and will be having an MRI for further evaluation.  She might end up needing a hip replacement.  Once she takes care of that issue hopefully she can start an exercise program.  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  We discussed her blood sugar which is minimally high today.  I recommended, again, exercise as soon as she is able to.  She will have her next mammogram in August and she will return to see me in 1 year.  Total encounter time 25 minutes.*  Kais Monje, Virgie Dad, MD  02/04/20 2:26 PM Medical Oncology and Hematology [facility] [place], [place] [address] Tel. [phone]    Fax. [phone]   I, Wilburn Mylar, am acting as scribe for Dr. Virgie Dad. Montoya Brandel.  I, Lurline Del MD, have reviewed the above documentation for accuracy and completeness, and I agree with the above.    *Total Encounter Time as defined by the Centers for Medicare and Medicaid Services includes, in addition to the face-to-face time of a patient visit (documented in the note above) non-face-to-face time: obtaining and reviewing outside history, ordering and reviewing medications, tests or procedures, care coordination (communications with other health care professionals or caregivers) and documentation in the medical record.

## 2020-02-04 ENCOUNTER — Inpatient Hospital Stay: Payer: 59 | Attending: Oncology | Admitting: Oncology

## 2020-02-04 ENCOUNTER — Inpatient Hospital Stay: Payer: 59

## 2020-02-04 ENCOUNTER — Other Ambulatory Visit: Payer: Self-pay

## 2020-02-04 VITALS — BP 115/80 | HR 88 | Temp 98.5°F | Resp 18 | Ht 64.0 in | Wt 184.7 lb

## 2020-02-04 DIAGNOSIS — Z923 Personal history of irradiation: Secondary | ICD-10-CM | POA: Diagnosis not present

## 2020-02-04 DIAGNOSIS — I4891 Unspecified atrial fibrillation: Secondary | ICD-10-CM

## 2020-02-04 DIAGNOSIS — C50511 Malignant neoplasm of lower-outer quadrant of right female breast: Secondary | ICD-10-CM

## 2020-02-04 DIAGNOSIS — Z79899 Other long term (current) drug therapy: Secondary | ICD-10-CM | POA: Diagnosis not present

## 2020-02-04 DIAGNOSIS — I10 Essential (primary) hypertension: Secondary | ICD-10-CM | POA: Insufficient documentation

## 2020-02-04 DIAGNOSIS — Z170 Estrogen receptor positive status [ER+]: Secondary | ICD-10-CM | POA: Diagnosis not present

## 2020-02-04 DIAGNOSIS — Z803 Family history of malignant neoplasm of breast: Secondary | ICD-10-CM | POA: Diagnosis not present

## 2020-02-04 DIAGNOSIS — Z79810 Long term (current) use of selective estrogen receptor modulators (SERMs): Secondary | ICD-10-CM | POA: Insufficient documentation

## 2020-02-04 DIAGNOSIS — C50411 Malignant neoplasm of upper-outer quadrant of right female breast: Secondary | ICD-10-CM | POA: Diagnosis not present

## 2020-02-04 DIAGNOSIS — I480 Paroxysmal atrial fibrillation: Secondary | ICD-10-CM | POA: Diagnosis not present

## 2020-02-04 DIAGNOSIS — Z7901 Long term (current) use of anticoagulants: Secondary | ICD-10-CM | POA: Insufficient documentation

## 2020-02-04 DIAGNOSIS — F17210 Nicotine dependence, cigarettes, uncomplicated: Secondary | ICD-10-CM | POA: Diagnosis not present

## 2020-02-04 LAB — COMPREHENSIVE METABOLIC PANEL
ALT: 16 U/L (ref 0–44)
AST: 14 U/L — ABNORMAL LOW (ref 15–41)
Albumin: 3.5 g/dL (ref 3.5–5.0)
Alkaline Phosphatase: 63 U/L (ref 38–126)
Anion gap: 11 (ref 5–15)
BUN: 22 mg/dL (ref 8–23)
CO2: 30 mmol/L (ref 22–32)
Calcium: 9.3 mg/dL (ref 8.9–10.3)
Chloride: 101 mmol/L (ref 98–111)
Creatinine, Ser: 0.95 mg/dL (ref 0.44–1.00)
GFR calc Af Amer: >60 mL/min (ref >60)
GFR calc non Af Amer: >60 mL/min (ref >60)
Glucose, Bld: 147 mg/dL — ABNORMAL HIGH (ref 70–99)
Potassium: 3.7 mmol/L (ref 3.5–5.1)
Sodium: 142 mmol/L (ref 135–145)
Total Bilirubin: 0.4 mg/dL (ref 0.3–1.2)
Total Protein: 6.8 g/dL (ref 6.5–8.1)

## 2020-02-04 LAB — CBC WITH DIFFERENTIAL/PLATELET
Abs Immature Granulocytes: 0.02 10*3/uL (ref 0.00–0.07)
Basophils Absolute: 0 10*3/uL (ref 0.0–0.1)
Basophils Relative: 1 %
Eosinophils Absolute: 0.1 10*3/uL (ref 0.0–0.5)
Eosinophils Relative: 2 %
HCT: 38.4 % (ref 36.0–46.0)
Hemoglobin: 12.7 g/dL (ref 12.0–15.0)
Immature Granulocytes: 0 %
Lymphocytes Relative: 29 %
Lymphs Abs: 1.6 10*3/uL (ref 0.7–4.0)
MCH: 32 pg (ref 26.0–34.0)
MCHC: 33.1 g/dL (ref 30.0–36.0)
MCV: 96.7 fL (ref 80.0–100.0)
Monocytes Absolute: 0.4 10*3/uL (ref 0.1–1.0)
Monocytes Relative: 7 %
Neutro Abs: 3.4 10*3/uL (ref 1.7–7.7)
Neutrophils Relative %: 61 %
Platelets: 192 10*3/uL (ref 150–400)
RBC: 3.97 MIL/uL (ref 3.87–5.11)
RDW: 13.2 % (ref 11.5–15.5)
WBC: 5.6 10*3/uL (ref 4.0–10.5)
nRBC: 0 % (ref 0.0–0.2)

## 2020-02-04 MED ORDER — TAMOXIFEN CITRATE 20 MG PO TABS: 20.0000 mg | ORAL_TABLET | Freq: Every day | ORAL | 6 refills | Status: DC

## 2020-02-05 ENCOUNTER — Telehealth: Payer: Self-pay | Admitting: Oncology

## 2020-02-05 NOTE — Telephone Encounter (Signed)
I talk with patient regarding schedule  

## 2020-02-26 ENCOUNTER — Other Ambulatory Visit (HOSPITAL_COMMUNITY): Payer: Self-pay | Admitting: Nurse Practitioner

## 2020-03-06 ENCOUNTER — Ambulatory Visit: Payer: 59

## 2020-03-25 NOTE — Telephone Encounter (Signed)
discard

## 2020-07-06 ENCOUNTER — Other Ambulatory Visit: Payer: Self-pay | Admitting: Oncology

## 2020-07-06 ENCOUNTER — Encounter: Payer: Self-pay | Admitting: Oncology

## 2020-07-06 DIAGNOSIS — Z9889 Other specified postprocedural states: Secondary | ICD-10-CM

## 2020-08-11 ENCOUNTER — Ambulatory Visit
Admission: RE | Admit: 2020-08-11 | Discharge: 2020-08-11 | Disposition: A | Discharge status: Home / Self Care | Payer: 59 | Source: Ambulatory Visit | Attending: Oncology | Admitting: Oncology

## 2020-08-11 ENCOUNTER — Other Ambulatory Visit: Payer: Self-pay

## 2020-08-11 DIAGNOSIS — Z9889 Other specified postprocedural states: Secondary | ICD-10-CM

## 2020-08-12 ENCOUNTER — Telehealth: Payer: Self-pay | Admitting: Genetic Counselor

## 2020-08-12 NOTE — Telephone Encounter (Signed)
Called Caroline [name] regarding her questions about the cost of genetic counseling/genetic testing per [name]. LVM that the genetic counseling visit is typically billed as a specialist visit to insurances, and encouraged her to speak with her insurance for additional information about this cost. I am happy to discuss the cost of genetic testing either before or during her appointment and requested that she call back if she would like to discuss this beforehand.

## 2020-08-20 ENCOUNTER — Other Ambulatory Visit: Payer: Self-pay

## 2020-08-20 ENCOUNTER — Inpatient Hospital Stay: Payer: 59

## 2020-08-20 ENCOUNTER — Inpatient Hospital Stay: Payer: 59 | Attending: Oncology | Admitting: Genetic Counselor

## 2020-08-20 DIAGNOSIS — Z170 Estrogen receptor positive status [ER+]: Secondary | ICD-10-CM

## 2020-08-20 DIAGNOSIS — Z1379 Encounter for other screening for genetic and chromosomal anomalies: Secondary | ICD-10-CM

## 2020-08-20 DIAGNOSIS — Z801 Family history of malignant neoplasm of trachea, bronchus and lung: Secondary | ICD-10-CM

## 2020-08-20 DIAGNOSIS — C50511 Malignant neoplasm of lower-outer quadrant of right female breast: Secondary | ICD-10-CM

## 2020-08-20 DIAGNOSIS — Z808 Family history of malignant neoplasm of other organs or systems: Secondary | ICD-10-CM

## 2020-08-20 DIAGNOSIS — Z803 Family history of malignant neoplasm of breast: Secondary | ICD-10-CM | POA: Diagnosis not present

## 2020-08-21 ENCOUNTER — Encounter: Payer: Self-pay | Admitting: Genetic Counselor

## 2020-08-21 DIAGNOSIS — Z803 Family history of malignant neoplasm of breast: Secondary | ICD-10-CM | POA: Insufficient documentation

## 2020-08-21 DIAGNOSIS — Z801 Family history of malignant neoplasm of trachea, bronchus and lung: Secondary | ICD-10-CM | POA: Insufficient documentation

## 2020-08-21 DIAGNOSIS — Z808 Family history of malignant neoplasm of other organs or systems: Secondary | ICD-10-CM | POA: Insufficient documentation

## 2020-08-21 NOTE — Progress Notes (Signed)
REFERRING PROVIDER: Chauncey Cruel, MD [place],  [place] [address]  PRIMARY PROVIDER:  Aura Dials, PA-C  PRIMARY REASON FOR VISIT:  1. 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 Family history of breast cancer   3. Family history of melanoma   4. Family history of lung cancer       HISTORY OF PRESENT ILLNESS:   Caroline [name], a 64 y.o. female, was seen for a [place] cancer genetics consultation at the request of Dr. Jana Hakim due to a personal and family history of cancer.  Caroline [name] presents to clinic today to discuss the possibility of a hereditary predisposition to cancer, genetic testing, and to further clarify her future cancer risks, as well as potential cancer risks for family members.   In August of 2018, at the age of 57, Caroline [name] was diagnosed with invasive ductal carcinoma, ER+/PR+/Her2-, of the right breast. The treatment plan included surgery, radiation therapy, and antiestrogen therapy.   RISK FACTORS:  Menarche was at age 57.  First live birth at age 71.  OCP use for approximately 15 years.  Ovaries intact: yes.  Hysterectomy: no.  Menopausal status: postmenopausal.  HRT use: 0 years. Colonoscopy: yes; not examined. Mammogram within the last year: yes.   Past Medical History:  Diagnosis Date  . Arthritis    hips  . Cancer [facility])    Left breast  . Family history of breast cancer   . Family history of lung cancer   . Family history of melanoma   . History of kidney stones   . Hypertension   . Paroxysmal atrial fibrillation (HCC)   . Personal history of radiation therapy   . S/P radiation therapy 10/10/17- 11/22/17   Right Breast/ 50 gy in 25 fractions, Right Breast boost/ 10 Gy in 5 fractions.    Past Surgical History:  Procedure Laterality Date  . BREAST BIOPSY Right 08/02/2017  . BREAST LUMPECTOMY Right 08/17/2017  . BREAST LUMPECTOMY WITH RADIOACTIVE SEED  AND SENTINEL LYMPH NODE BIOPSY Right 08/17/2017   Procedure: RIGHT BREAST RADIOACTIVE SEED LOCALIZED LUMPECTOMY AND SENTINEL LYMPH NODE BIOPSY;  Surgeon: Erroll Luna, MD;  Location: [place];  Service: General;  Laterality: Right;  . CESAREAN SECTION  [phone]  . COLONOSCOPY    . HEMORRHOID SURGERY     20 years ago  . RE-EXCISION OF BREAST LUMPECTOMY Right 09/06/2017   Procedure: RE-EXCISION OF RIGHT BREAST LUMPECTOMY;  Surgeon: Erroll Luna, MD;  Location: [place];  Service: General;  Laterality: Right;  . TOE SURGERY Right    5th 20 years ago  . TOTAL HIP ARTHROPLASTY Left 12/01/2017   Procedure: LEFT TOTAL HIP ARTHROPLASTY ANTERIOR APPROACH;  Surgeon: Mcarthur Rossetti, MD;  Location: [facility];  Service: Orthopedics;  Laterality: Left;    Social History   Socioeconomic History  . Marital status: Married    Spouse name: Not on file  . Number of children: Not on file  . Years of education: Not on file  . Highest education level: Not on file  Occupational History  . Not on file  Tobacco Use  . Smoking status: Current Every Day Smoker    Packs/day: 0.50    Years: 40.00    Pack years: 20.00    Types: Cigarettes  . Smokeless tobacco: Never Used  Vaping Use  . Vaping Use: Never used  Substance and Sexual Activity  . Alcohol  use: Yes    Alcohol/week: 7.0 standard drinks    Types: 7 Glasses of wine per week    Comment: wine nightly  . Drug use: No  . Sexual activity: Yes    Birth control/protection: Post-menopausal  Other Topics Concern  . Not on file  Social History Narrative  . Not on file   Social Determinants of Health   Financial Resource Strain:   . Difficulty of Paying Living Expenses: Not on file  Food Insecurity:   . Worried About Charity fundraiser in the Last Year: Not on file  . Ran Out of Food in the Last Year: Not on file  Transportation Needs:   . Lack of Transportation (Medical): Not on file  . Lack of  Transportation (Non-Medical): Not on file  Physical Activity:   . Days of Exercise per Week: Not on file  . Minutes of Exercise per Session: Not on file  Stress:   . Feeling of Stress : Not on file  Social Connections:   . Frequency of Communication with Friends and Family: Not on file  . Frequency of Social Gatherings with Friends and Family: Not on file  . Attends Religious Services: Not on file  . Active Member of Clubs or Organizations: Not on file  . Attends Archivist Meetings: Not on file  . Marital Status: Not on file     FAMILY HISTORY:  We obtained a detailed, 4-generation family history.  Significant diagnoses are listed below: Family History  Problem Relation Age of Onset  . Breast cancer Cousin        dx. in her 18s (paternal first cousin)  . Emphysema Mother   . Dementia Father   . Melanoma Brother        dx. in his 24s or 23s  . Lung cancer Paternal Aunt        dx. late 39s, smoker   Caroline [name] has one daughter (age 71) and one son (age 9). She has two brothers - one died at the age of 78, and the other is currently 33 and has a history of melanoma diagnosed in his late 14s or early 63s.   Caroline [name] mother died at the age of 63 with emphysema nd did not have cancer. Caroline [name] had no maternal aunts or uncles. Her maternal grandparents died older than 59 and did not have cancer.   Caroline [name] father died at the age of 71 with dementia and did not have cancer. She had one paternal aunt who died in her late 65s from lung cancer and had a history of smoking. She had one paternal first cousin who died from breast cancer in her 64s. Caroline [name] paternal grandmother died in her 75s from an unknown cause, and her paternal grandfather died in his 57s.   Caroline [name] is unaware of previous family history of genetic testing for hereditary cancer risks. Patient's maternal ancestors are of [place] descent, and paternal ancestors are of [place] descent. There  is no reported Ashkenazi Jewish ancestry. There is no known consanguinity.  GENETIC COUNSELING ASSESSMENT: Caroline [name] is a 64 y.o. female with a personal and family history of breast cancer which is somewhat suggestive of a hereditary cancer syndrome and predisposition to cancer. We, therefore, discussed and recommended the following at today's visit.   DISCUSSION: We discussed that approximately 5-10% of breast cancer is hereditary, with most cases associated with the BRCA1 and BRCA2 genes. There are other genes that can  be associated with hereditary breast cancer syndromes. These include ATM, CHEK2, PALB2, etc. We discussed that testing is beneficial for several reasons, including knowing about other cancer risks, identifying potential screening and risk-reduction options that may be appropriate, and to understand if other family members could be at risk for cancer and allow them to undergo genetic testing.  We reviewed the characteristics, features and inheritance patterns of hereditary cancer syndromes. We also discussed genetic testing, including the appropriate family members to test, the process of testing, insurance coverage and turn-around-time for results. We discussed the implications of a negative, positive and/or variant of uncertain significant result. We recommended Caroline [name] pursue genetic testing for the [facility] Common Hereditary Cancers panel.   The Common Hereditary Cancers Panel offered by [facility] includes sequencing and/or deletion duplication testing of the following 48 genes: APC, ATM, AXIN2, BARD1, BMPR1A, BRCA1, BRCA2, BRIP1, CDH1, CDK4, CDKN2A (p14ARF), CDKN2A (p16INK4a), CHEK2, CTNNA1, DICER1, EPCAM (Deletion/duplication testing only), GREM1 (promoter region deletion/duplication testing only), KIT, MEN1, MLH1, MSH2, MSH3, MSH6, MUTYH, NBN, NF1, NTHL1, PALB2, PDGFRA, PMS2, POLD1, POLE, PTEN, RAD50, RAD51C, RAD51D, RNF43, SDHB, SDHC, SDHD, SMAD4, SMARCA4. STK11, TP53, TSC1, TSC2,  and VHL.  The following genes are evaluated for sequence changes only: SDHA and HOXB13 c.251G>A variant only.   Based on Caroline [name]'s personal and family history of cancer, she meets medical criteria for genetic testing. Despite that she meets criteria, she may still have an out of pocket cost. We discussed that if her out of pocket cost for testing is over $100, the laboratory will reach out to let her know. If the out of pocket cost of testing is less than $100 she will be billed by the genetic testing laboratory.   PLAN: After considering the risks, benefits, and limitations, Caroline [name] provided informed consent to pursue genetic testing and the blood sample was sent to [facility] for analysis of the Common Hereditary Cancers Panel. Results should be available within approximately two-three weeks' time, at which point they will be disclosed by telephone to Caroline [name], as will any additional recommendations warranted by these results. Caroline [name] will receive a summary of her genetic counseling visit and a copy of her results once available. This information will also be available in Epic.   Caroline [name] questions were answered to her satisfaction today. Our contact information was provided should additional questions or concerns arise. Thank you for the referral and allowing [place] to share in the care of your patient.   Clint Guy, [place], [facility] Licensed, Certified Dispensing [email or web address] Phone: [phone]  The patient was seen for a total of 40 minutes in face-to-face genetic counseling.  This patient was discussed with Drs. Magrinat, Lindi Adie and/or Burr Medico who agrees with the above.    _______________________________________________________________________ For Office Staff:  Number of people involved in session: 1 Was an Intern/ student involved with case: no

## 2020-09-18 ENCOUNTER — Other Ambulatory Visit: Payer: Self-pay | Admitting: Oncology

## 2020-09-18 ENCOUNTER — Other Ambulatory Visit (HOSPITAL_COMMUNITY): Payer: Self-pay | Admitting: Nurse Practitioner

## 2020-09-25 ENCOUNTER — Other Ambulatory Visit (HOSPITAL_COMMUNITY): Payer: Self-pay | Admitting: Nurse Practitioner

## 2020-09-25 ENCOUNTER — Other Ambulatory Visit: Payer: Self-pay | Admitting: Oncology

## 2020-10-01 ENCOUNTER — Telehealth: Payer: Self-pay | Admitting: Genetic Counselor

## 2020-10-01 NOTE — Telephone Encounter (Signed)
LVM that her genetic test results are available and requested that she call back to discuss them.  

## 2020-10-05 NOTE — Telephone Encounter (Signed)
LVM that her genetic test results are available and requested that she call back to discuss them.  

## 2020-10-06 ENCOUNTER — Ambulatory Visit: Payer: Self-pay | Admitting: Genetic Counselor

## 2020-10-06 DIAGNOSIS — Z1379 Encounter for other screening for genetic and chromosomal anomalies: Secondary | ICD-10-CM

## 2020-10-06 NOTE — Telephone Encounter (Signed)
Revealed abnormal genetic testing. 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 This means that the variants were not found in every cell in the body, as is typically expected with a hereditary cancer syndrome/germline pathogenic variant. We discussed that the cause/origin of these TP53 variants is undetermined at this time. Essentially, there are two possible explanations:  Ms. Stang may have a hereditary cancer syndrome associated with the TP53 gene called Li-Fraumeni syndrome, but only in some of her cells. This is called mosaic Li-Fraumeni syndrome. In this case, she would have been born with the mutation(s) and her family members would also be at risk for having Li-Fraumeni syndrome and developing cancer. We briefly reviewed cancer risks and screening recommendations for those with Li-Fraumeni syndrome.  Ms. Warrior may have acquired these mutations sporadically over time. This is called clonal hematopoesis of indeterminate potential (CHIP) and occurs when somatic (acquired) mutations are found in a portion of the bone marrow and/or blood. The prevalence of CHIP rises with age (found in roughly 10% of individuals ages 69 to 26). The genetic mutations found in those with CHIP are NOT associated with Li-Fraumeni syndrome and are not passed on to children. While CHIP is not a hereditary cancer syndrome, it does increase the risk for blood cancers as well as cardiovascular problems.   Again, at this time we do not know if Ms. Melucci was born with one or both of these mutations, or if they occurred sporadically over time. Additional testing may help clarify the origin of these mutations. We discussed recommendations to test her children for Li-Fraumeni syndrome and to test another tissue type (usually a skin sample) from Ms. Biby to see if the  mutation(s) may be present in other parts of her body. If either of these additional tests show the TP53 mutation(s), it would be consistent with Li-Fraumeni syndrome. If these tests do not show the TP53 mutations, this would be reassuring but could not completely eliminate the possibility of mosaic Li-Fraumeni syndrome.   We will provide Ms. Jorgenson with a summary of this conversation. Our contact information was provided should Ms. Gonce have additional questions or concerns about this result. She plans to discuss this result with her children and help them set up genetic counseling with [place] for discussion of testing.

## 2020-10-07 ENCOUNTER — Encounter: Payer: Self-pay | Admitting: Genetic Counselor

## 2020-10-07 DIAGNOSIS — Z1379 Encounter for other screening for genetic and chromosomal anomalies: Secondary | ICD-10-CM | POA: Insufficient documentation

## 2020-10-07 NOTE — Progress Notes (Signed)
GENETIC TEST RESULTS   Patient Name: INGE WALDROUP Patient Age: 64 y.o. Encounter Date: 10/06/2020  Referring Provider: Chauncey Cruel, MD [address] [place],  [place] [address]   HPI:  Ms. Reddix was previously seen in the [place] clinic due to a personal and family history of cancer and concerns regarding a hereditary predisposition to cancer. Please refer to our prior cancer genetics clinic note for more information regarding our discussion, assessment and recommendations, at the time. Ms. Bruns recent genetic test results were disclosed to her, as were recommendations warranted by these results. These results and recommendations are discussed in more detail below.  FAMILY HISTORY:  We obtained a detailed, 4-generation family history.  Significant diagnoses are listed below: Family History  Problem Relation Age of Onset  . Breast cancer Cousin        dx. in her 64s (paternal first cousin)  . Emphysema Mother   . Dementia Father   . Melanoma Brother        dx. in his 66s or 48s  . Lung cancer Paternal Aunt        dx. late 65s, smoker    Ms. Fayad has one daughter (age 20) and one son (age 39). She has two brothers - one died at the age of 18, and the other is currently 46 and has a history of melanoma diagnosed in his late 47s or early 68s.   Ms. Encarnacion mother died at the age of 60 with emphysema and did not have cancer. Ms. Dockendorf had no maternal aunts or uncles. Her maternal grandparents died older than 74 and did not have cancer.   Ms. Woodbeck father died at the age of 102 with dementia and did not have cancer. She had one paternal aunt who died in her late 60s from lung cancer and had a history of smoking. She had one paternal first cousin who died from breast cancer in her 32s. Ms. Pata paternal grandmother died in her 25s from an unknown cause, and her paternal grandfather died in his 34s.   Ms. Batdorf is unaware of  previous family history of genetic testing for hereditary cancer risks. Patient's maternal ancestors are of [place] descent, and paternal ancestors are of [place] descent. There is no reported Ashkenazi Jewish ancestry. There is no known consanguinity.  GENETIC TEST RESULTS: Genetic testing reported out on 09/18/2020 through the [facility] Common Hereditary Cancers panel. 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 The variant allele frequency (VAF) for the c.542G>C variant was 18-28%, while the VAF for the c.818G>A variant was 10-20%.  The Common Hereditary Cancers Panel offered by [facility] includes sequencing and/or deletion duplication testing of the following 48 genes: APC, ATM, AXIN2, BARD1, BMPR1A, BRCA1, BRCA2, BRIP1, CDH1, CDK4, CDKN2A (p14ARF), CDKN2A (p16INK4a), CHEK2, CTNNA1, DICER1, EPCAM (Deletion/duplication testing only), GREM1 (promoter region deletion/duplication testing only), KIT, MEN1, MLH1, MSH2, MSH3, MSH6, MUTYH, NBN, NF1, NTHL1, PALB2, PDGFRA, PMS2, POLD1, POLE, PTEN, RAD50, RAD51C, RAD51D, RNF43, SDHB, SDHC, SDHD, SMAD4, SMARCA4. STK11, TP53, TSC1, TSC2, and VHL.  The following genes were evaluated for sequence changes only: SDHA and HOXB13 c.251G>A variant only. The test report will be scanned into EPIC and located under the Molecular Pathology section of the Results Review tab.  A portion of the result report is included below for reference.     EXPLANATION OF RESULTS:  We discussed that both of the  TP53 pathogenic variants detected on Ms. Campoy's test were identified to be possibly mosaic. This means that the variants were not found in every cell in the body, as is typically expected with a hereditary cancer syndrome/germline pathogenic variant. We discussed that the cause/origin of these TP53 variants is undetermined at this time. Essentially, there are two possible  explanations:  1. Mosaic Li-Fraumeni syndrome:  Ms. Ow may have a hereditary cancer syndrome associated with the TP53 gene called Li-Fraumeni syndrome, but only in some of her cells. This is called mosaic Li-Fraumeni syndrome. In this case, she would have been born with the mutation(s) and her family members would also be at risk for having Li-Fraumeni syndrome. We briefly reviewed cancer risks and screening recommendations for those with Li-Fraumeni syndrome.  Affecting both children and adults, LFS is traditionally characterized by an increased risk of a broad spectrum of tumor types, including the following "core" cancers: sarcoma (osteosarcoma, or soft tissue), pre-menopausal breast cancer, brain tumors, leukemia, and adrenocortical carcinoma (ACC). In addition, a variety of other cancers may occur, not excluding ovarian cancer. Men with LFS have an overall 73% chance of developing cancer, and women with LFS have a nearly 100% risk of cancer due to the high incidence of breast cancer. For those who have already had cancer, studies have shown that individuals with LFS have a 40-49% risk to develop a second primary cancer.  Testing Ms. Isip's relatives can determine who may have inherited the mutation(s), and therefore needs increased surveillance. Testing can also determine who has not inherited the mutation(s), and therefore may not require increased surveillance. Because we know the exact change in her TP53 gene, we are able to offer specific testing to other family members.   2. Clonal Hematopoiesis of Indeterminate Potential:  Ms. Landrigan may have acquired these mutations sporadically over time. This is called clonal hematopoiesis of indeterminate potential (CHIP) and occurs when somatic (acquired) mutations are found in a portion of the bone marrow and/or blood. The prevalence of CHIP rises with age (found in roughly 10% of individuals ages 64 to 46). The genetic mutations found in those with  CHIP are NOT associated with Li-Fraumeni syndrome and are not passed on to children. While CHIP is not a hereditary cancer syndrome, it does increase the risk for blood cancers (0.5-1% risk per year) as well as heart disease. Management for individuals with CHIP should include monitoring for blood cancer by routine complete blood count (CBC) screening and reducing risk factors for heart disease.    Currently there are no evidence-based guidelines for managing patients with clonal hematopoiesis. For Ms. Adamec's medical providers (per [employer]): Marland Kitchen Monitoring of the patient with clonal hematopoiesis should be individualized. Some clinics evaluate individuals every three to six months with an interval history, directed physical examination, and complete blood count (CBC) and differential. They suggest not routinely repeating DNA sequencing and not routinely repeating a bone marrow examination, unless there is another indication. . The nature and frequency of monitoring is influenced by the level of variant allele frequency (VAF), presence of cytopenias, and concerns of the clinician and affected individual. Healthcare providers may modify the frequency of visits and/or CBCs in individuals with one or more of the following: o High-risk mutation (eg, TP53 mutation) o VAF ?13 percent o ?2 distinct mutations o Cytopenias, especially if associated with clinical findings (eg, cardiorespiratory compromise, infections, bleeding/bruising) and/or other associated cardiovascular risk factors  Again, at this time we do not know if Ms. Reddinger was born  with one or both of these mutations, or if they occurred sporadically over time. Additional testing may help clarify the origin of these mutations.   ADDITIONAL GENETIC TESTING:  We discussed recommendations to test her children for Li-Fraumeni syndrome and to test another tissue type (usually a skin sample) from Ms. Montefusco to see if the mutation(s) may be present in  other parts of her body. If either of these additional tests show the TP53 mutation(s), it would be consistent with Li-Fraumeni syndrome. If these tests do not show the TP53 mutations, this would be reassuring but could not completely eliminate the possibility of mosaic Li-Fraumeni syndrome. In either scenario, it may be reasonable for Ms. Sudano to consider screening for LFS. We recommended that Ms. Burd's family members coordinate their testing through a genetic specialist to ensure that pre-test genetic counseling regarding cancer risks and management options is performed and the correct test is ordered.  Ms. Salado plans to discuss this result with her children and help them set up genetic counseling with [place] for discussion of testing. Once we have their results (if they elect testing), we will discuss the appropriateness of further testing on a skin biopsy for Ms. Homeyer and help coordinate this testing as needed.   We will provide Ms. Parkey with a summary of this conversation. Our contact information was provided should she have additional questions or concerns about this result.   Clint Guy, MS, [facility] Genetic Counselor Email:  [email or web address] Phone:  [phone]

## 2020-10-24 ENCOUNTER — Ambulatory Visit: Payer: 59

## 2021-01-22 ENCOUNTER — Other Ambulatory Visit (HOSPITAL_COMMUNITY): Payer: Self-pay | Admitting: Nurse Practitioner

## 2021-02-03 ENCOUNTER — Other Ambulatory Visit: Payer: Self-pay

## 2021-02-03 DIAGNOSIS — Z170 Estrogen receptor positive status [ER+]: Secondary | ICD-10-CM

## 2021-02-03 DIAGNOSIS — C50511 Malignant neoplasm of lower-outer quadrant of right female breast: Secondary | ICD-10-CM

## 2021-02-03 NOTE — Progress Notes (Signed)
[place]  [phone] [phone]     ID: Caroline [name] DOB: 1956-08-22  MR#: [id number]  HGD#:[id number]  Patient Care Team: Selinda Orion as PCP - General (Physician Assistant) Malon Siddall, Virgie Dad, MD as Consulting Physician (Oncology) Erroll Luna, MD as Consulting Physician (General Surgery) Haygood, Seymour Bars, MD (Inactive) as Consulting Physician (Obstetrics and Gynecology) Donzetta Sprung., MD as Physician Assistant (Sports Medicine) Eppie Gibson, MD as Attending Physician (Radiation Oncology) Juanita Craver, MD as Consulting Physician (Gastroenterology) Thompson Grayer, MD as Consulting Physician (Cardiology) Mcarthur Rossetti, MD as Consulting Physician (Orthopedic Surgery) Chauncey Cruel, MD OTHER MD:  CHIEF COMPLAINT: Estrogen receptor positive breast cancer  CURRENT TREATMENT: tamoxifen   INTERVAL HISTORY: Caroline [name] returns today for follow-up of her estrogen receptor positive breast cancer.   She continues on tamoxifen.  Generally she does well with this but recently she has been having more hot flashes especially at night and they can wake her up.  Since her last visit, she underwent bilateral diagnostic mammography with tomography at [facility] on 08/11/2020 showing: breast density category C; no evidence of malignancy in either breast.   She also underwent genetic testing on 08/20/2020. 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  This is discussed below   REVIEW OF SYSTEMS: Caroline [name] is having significant left hip pain.  This hip was placed 4 years ago and she is now also having some right hip discomfort.  More importantly she has a new left groin area pain which she attributes to the whole pelvic arthritis issue.  She is not able to exercise because of this.  She is taking Percocet 4 times a day.  She tells me her  daughter and son both tested positive for Covid.  Her son lives with her and she did have a period where she felt more tired and had some headaches but did not herself get tested.  Aside from these issues a detailed review of systems was stable.   COVID 19 VACCINATION STATUS: Status post Pfizer x2 with booster in August, possible infection December 2021   HISTORY OF CURRENT ILLNESS: From the original intake note:  Caroline [name] (last name is pronounced muh-[id number]) had routine screening mammography with tomography at the [facility] 07/21/2017, showing the breast density to be category C. An area of possible distortion was noted in the right breast, and on 07/28/2017 she underwent right diagnostic mammography with tomography and right breast ultrasonography. This confirmed an area of distortion in the outer right breast which by exam showed acid focal thickening at the 8:30 position, 4 cm from the nipple. Ultrasound showed 3 separate irregular hypoechoic areas in this region, measuring 0.9, 0.7 and 0.5 cm. Taken together these 2 areas at up to 2.1 cm in greatest diameter. There was no abnormal right axillary adenopathy.  2 of these masses were biopsied 08/02/2017. The final pathology ([id number]) showed both to consist of invasive ductal carcinoma, grade 2 with the prognostic panel (from the lesion at 8:30 o'clock 4 cm from the nipple) was estrogen receptor 90% positive, progesterone receptor 20% positive, both with strong staining intensity, with an MIB-1 of 15%, and no HER-2 complication, the signals ratio being 1.00 and the number per cell 1.05.  The patient proceeded to right lumpectomy and axillary sentinel lymph node sampling 08/17/2017, with the final pathology ([id number]) showing 2 areas of invasive ductal carcinoma, grade 2, measuring  1.5 and 0.7 cm. Margins were broadly positive medially and focally laterally. The single sentinel lymph node was involved by tumor.  The patient's subsequent  history is as detailed below.   PAST MEDICAL HISTORY: Past Medical History:  Diagnosis Date  . Arthritis    hips  . Cancer [facility])    Left breast  . Family history of breast cancer   . Family history of lung cancer   . Family history of melanoma   . History of kidney stones   . Hypertension   . Paroxysmal atrial fibrillation (HCC)   . Personal history of radiation therapy   . S/P radiation therapy 10/10/17- 11/22/17   Right Breast/ 50 gy in 25 fractions, Right Breast boost/ 10 Gy in 5 fractions.    PAST SURGICAL HISTORY: Past Surgical History:  Procedure Laterality Date  . BREAST BIOPSY Right 08/02/2017  . BREAST LUMPECTOMY Right 08/17/2017  . BREAST LUMPECTOMY WITH RADIOACTIVE SEED AND SENTINEL LYMPH NODE BIOPSY Right 08/17/2017   Procedure: RIGHT BREAST RADIOACTIVE SEED LOCALIZED LUMPECTOMY AND SENTINEL LYMPH NODE BIOPSY;  Surgeon: Erroll Luna, MD;  Location: [place];  Service: General;  Laterality: Right;  . CESAREAN SECTION  [phone]  . COLONOSCOPY    . HEMORRHOID SURGERY     20 years ago  . RE-EXCISION OF BREAST LUMPECTOMY Right 09/06/2017   Procedure: RE-EXCISION OF RIGHT BREAST LUMPECTOMY;  Surgeon: Erroll Luna, MD;  Location: [place];  Service: General;  Laterality: Right;  . TOE SURGERY Right    5th 20 years ago  . TOTAL HIP ARTHROPLASTY Left 12/01/2017   Procedure: LEFT TOTAL HIP ARTHROPLASTY ANTERIOR APPROACH;  Surgeon: Mcarthur Rossetti, MD;  Location[facility];  Service: Orthopedics;  Laterality: Left;    FAMILY HISTORY Family History  Problem Relation Age of Onset  . Breast cancer Cousin        dx. in her 59s (paternal first cousin)  . Emphysema Mother   . Dementia Father   . Melanoma Brother        dx. in his 70s or 18s  . Lung cancer Paternal Aunt        dx. late 66s, smoker  The patient's father died from dementia at the age of 38. The patient's mother died from emphysema at the age of 8. The patient  has 2 brothers, no sisters. On the paternal side a first cousin was diagnosed with breast cancer at the age of 74. There is no other breast ovarian or prostate cancer in the family but the patient is aware it is a small family. The patient's brother living in [place] had a melanoma diagnosed in his 29s   GYNECOLOGIC HISTORY:  Patient's last menstrual period was 11/25/2010. Menarche age 63, first live birth age 49, she is Moro P3. The middle child was stillborn. She stopped having periods in her late 27s. She did not take hormone replacement. She took oral contraceptives for approximately 15 years remotely with no complications.   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  She retired in 2021.  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 Daughter Caroline [name] graduated from [employer] in exercise and sports signs.  She married May 2021.  Son Caroline [name] is currently working with his father.  He is 22 as of February 2021 and lives at home.  The patient is not a church attender.    ADVANCED DIRECTIVES: In the absence of any  documents to the contrary the patient's husband is her healthcare power of attorney   HEALTH MAINTENANCE: Social History   Tobacco Use  . Smoking status: Current Every Day Smoker    Packs/day: 0.50    Years: 40.00    Pack years: 20.00    Types: Cigarettes  . Smokeless tobacco: Never Used  Vaping Use  . Vaping Use: Never used  Substance Use Topics  . Alcohol use: Yes    Alcohol/week: 7.0 standard drinks    Types: 7 Glasses of wine per week    Comment: wine nightly  . Drug use: No     Colonoscopy:/ [facility]  PAP:  Bone density: remote   Allergies  Allergen Reactions  . Sulfa Antibiotics Other (See Comments)    Unknown, was child    Current Outpatient Medications  Medication Sig Dispense Refill  . buPROPion (WELLBUTRIN XL) 150 MG 24 hr tablet Take 150 mg by mouth daily.    Marland Kitchen HYDROcodone-acetaminophen  (NORCO/VICODIN) 5-325 MG tablet Take 1-2 tablets by mouth 2 (two) times daily as needed for moderate pain. 30 tablet 0  . metoprolol succinate (TOPROL-XL) 25 MG 24 hr tablet Take 1 tablet (25 mg total) by mouth in the morning and at bedtime. Future refills from pcp 60 tablet 0  . tamoxifen (NOLVADEX) 20 MG tablet TAKE ONE TABLET BY MOUTH DAILY 60 tablet 3  . valsartan-hydrochlorothiazide (DIOVAN-HCT) 160-12.5 MG tablet Take 1 tablet by mouth daily.     No current facility-administered medications for this visit.    OBJECTIVE: White woman who appears stated age  9:   02/04/21 1357  BP: 110/80  Pulse: 93  Resp: 18  SpO2: 96%     Body mass index is 31.7 kg/m.   Wt Readings from Last 3 Encounters:  02/04/20 184 lb 11.2 oz (83.8 kg)  12/31/18 181 lb (82.1 kg)  01/10/18 170 lb 3.2 oz (77.2 kg)      ECOG FS:1 - Symptomatic but completely ambulatory  Sclerae unicteric, EOMs intact Wearing a mask No cervical or supraclavicular adenopathy Lungs no rales or rhonchi Heart regular rate and rhythm Abd soft, nontender, positive bowel sounds MSK no focal spinal tenderness, no upper extremity lymphedema Neuro: nonfocal, well oriented, appropriate affect Breasts: The right breast is status post lumpectomy and radiation.  There is no evidence of local recurrence.  The left breast is benign.  Both axillae are benign.   LAB RESULTS:   CMP     Component Value Date/Time   NA 142 02/04/2020 1347   NA 140 08/31/2017 1616   K 3.7 02/04/2020 1347   K 3.9 08/31/2017 1616   CL 101 02/04/2020 1347   CO2 30 02/04/2020 1347   CO2 27 08/31/2017 1616   GLUCOSE 147 (H) 02/04/2020 1347   GLUCOSE 139 08/31/2017 1616   BUN 22 02/04/2020 1347   BUN 45.6 (H) 08/31/2017 1616   CREATININE 0.95 02/04/2020 1347   CREATININE 1.18 (H) 01/10/2018 1307   CREATININE 1.6 (H) 08/31/2017 1616   CALCIUM 9.3 02/04/2020 1347   CALCIUM 10.0 08/31/2017 1616   PROT 6.8 02/04/2020 1347   PROT 7.2 08/31/2017  1616   ALBUMIN 3.5 02/04/2020 1347   ALBUMIN 3.5 08/31/2017 1616   AST 14 (L) 02/04/2020 1347   AST 12 01/10/2018 1307   AST 12 08/31/2017 1616   ALT 16 02/04/2020 1347   ALT 10 01/10/2018 1307   ALT 14 08/31/2017 1616   ALKPHOS 63 02/04/2020 1347   ALKPHOS 76 08/31/2017  1616   BILITOT 0.4 02/04/2020 1347   BILITOT 0.3 01/10/2018 1307   BILITOT 0.25 08/31/2017 1616   GFRNONAA >60 02/04/2020 1347   GFRNONAA 49 (L) 01/10/2018 1307   GFRAA >60 02/04/2020 1347   GFRAA 57 (L) 01/10/2018 1307    No results found for: Ronnald Ramp, A1GS, A2GS, BETS, BETA2SER, GAMS, MSPIKE, SPEI  No results found for: Nils Pyle, [facility]  Lab Results  Component Value Date   WBC 6.5 02/04/2021   NEUTROABS PENDING 02/04/2021   HGB 12.4 02/04/2021   HCT 38.5 02/04/2021   MCV 97.5 02/04/2021   PLT 210 02/04/2021      Chemistry      Component Value Date/Time   NA 142 02/04/2020 1347   NA 140 08/31/2017 1616   K 3.7 02/04/2020 1347   K 3.9 08/31/2017 1616   CL 101 02/04/2020 1347   CO2 30 02/04/2020 1347   CO2 27 08/31/2017 1616   BUN 22 02/04/2020 1347   BUN 45.6 (H) 08/31/2017 1616   CREATININE 0.95 02/04/2020 1347   CREATININE 1.18 (H) 01/10/2018 1307   CREATININE 1.6 (H) 08/31/2017 1616      Component Value Date/Time   CALCIUM 9.3 02/04/2020 1347   CALCIUM 10.0 08/31/2017 1616   ALKPHOS 63 02/04/2020 1347   ALKPHOS 76 08/31/2017 1616   AST 14 (L) 02/04/2020 1347   AST 12 01/10/2018 1307   AST 12 08/31/2017 1616   ALT 16 02/04/2020 1347   ALT 10 01/10/2018 1307   ALT 14 08/31/2017 1616   BILITOT 0.4 02/04/2020 1347   BILITOT 0.3 01/10/2018 1307   BILITOT 0.25 08/31/2017 1616       No results found for: LABCA2  No components found for: BEEFEO712  No results for input(s): INR in the last 168 hours.  No results found for: LABCA2  No results found for: RFX588  No results found for: TGP498  No results found for: YME158  No results found  for: CA2729  No components found for: HGQUANT  No results found for: CEA1 / No results found for: CEA1   No results found for: AFPTUMOR  No results found for: CHROMOGRNA  No results found for: HGBA, HGBA2QUANT, HGBFQUANT, HGBSQUAN (Hemoglobinopathy evaluation)   No results found for: LDH  No results found for: IRON, TIBC, IRONPCTSAT (Iron and TIBC)  No results found for: FERRITIN  Urinalysis No results found for: COLORURINE, APPEARANCEUR, LABSPEC, PHURINE, GLUCOSEU, HGBUR, BILIRUBINUR, KETONESUR, PROTEINUR, UROBILINOGEN, NITRITE, LEUKOCYTESUR   STUDIES: No results found.    ELIGIBLE FOR AVAILABLE RESEARCH PROTOCOL: no  ASSESSMENT: 65 y.o. [place], [place] woman status post right breast upper outer quadrant biopsy 08/02/2017 for a clinical T1c No invasive ductal carcinoma, grade 2, estrogen and progesterone receptor positive, HER-2 not amplified, with an MIB-1 of 15%.  (1) status post right lumpectomy and axillary sentinel lymph node sampling 08/17/2017 for an mpT1c pN1, stage IB invasive ductal carcinoma, grade 2, with positive margins  (2) additional surgery 09/06/2017 successfully cleared margins.  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 The variant allele frequency (VAF) for the c.542G>C variant was 18-28%, while the VAF for the c.818G>A variant was 10-20  (a) no additional deleterious mutations were found in APC, ATM, AXIN2, BARD1, BMPR1A, BRCA1,  BRCA2, BRIP1, CDH1, CDK4, CDKN2A (p14ARF), CDKN2A (p16INK4a), CHEK2, CTNNA1, DICER1, EPCAM (Deletion/duplication testing only), GREM1 (promoter region deletion/duplication testing only), KIT, MEN1, MLH1, MSH2, MSH3, MSH6, MUTYH, NBN, NF1, NTHL1, PALB2, PDGFRA, PMS2, POLD1, POLE, PTEN, RAD50, RAD51C, RAD51D, RNF43, SDHB, SDHC, SDHD, SMAD4, SMARCA4. STK11, TP53, TSC1, TSC2, and VHL.  The following genes were evaluated for sequence changes only: SDHA and HOXB13 c.251G>A variant only  (7) paroxysmal atrial fibrillation: On lifelong anticoagulation (apixaban)   PLAN: Siyona is now 3-1/2 years out from definitive surgery for her breast cancer with no evidence of disease recurrence.  This is very favorable.  She is tolerating tamoxifen generally well.  The plan will be to continue that a total of 5 years.  For her nighttime hot flashes were going to give gabapentin at bedtime a try.  This is usually quite effective for nighttime hot flashes and well-tolerated.  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  If she had frank Li-Fraumeni syndrome I would refer her to the Li-Fraumeni syndrome clinic in [place].  Because her case is not so clear-cut I do think she needs a little bit more close follow-up then otherwise so we will see her every 110month and specifically I will see her in August after her next mammogram.  I would just do physical exam review of system and labs.  The only new problem in the review of systems is her left groin pain.  This certainly could be related to her prior hip surgery and she is planning to see Dr. CZollie Beckersto evaluate this further.  However given the genetic issue the possibility of a sarcoma or other tumor needs to be considered so I would recommend as part of the evaluation and MRI of the pelvis and hip.  If that is not felt to be necessary by orthopedics I would be glad to order it.  She and I discussed this in detail and she also understands it would be favorable if we  could test her children  Total encounter time 40 minutes.*  Shakeisha Horine, GVirgie Dad MD  02/04/21 2:07 PM [facility] [place] [address]Tel. [phone]   Fax. [phone]  I, KWilburn Mylar am acting as scribe for Dr. GVirgie Dad Cheryln Balcom.  I, GLurline DelMD, have reviewed the above documentation for accuracy and completeness, and I agree with the above.   *Total Encounter Time as defined by the Centers for Medicare and Medicaid Services includes, in addition to the face-to-face time of a patient visit (documented in the note above) non-face-to-face time: obtaining and reviewing outside history, ordering and reviewing medications, tests or procedures, care coordination (communications with other health care professionals or caregivers) and documentation in the medical record.

## 2021-02-04 ENCOUNTER — Other Ambulatory Visit: Payer: Self-pay

## 2021-02-04 ENCOUNTER — Inpatient Hospital Stay: Payer: 59 | Attending: Oncology | Admitting: Oncology

## 2021-02-04 ENCOUNTER — Inpatient Hospital Stay: Payer: 59

## 2021-02-04 ENCOUNTER — Encounter: Payer: Self-pay | Admitting: Oncology

## 2021-02-04 VITALS — BP 110/80 | HR 93 | Resp 18 | Ht 64.0 in

## 2021-02-04 DIAGNOSIS — C50511 Malignant neoplasm of lower-outer quadrant of right female breast: Secondary | ICD-10-CM | POA: Diagnosis present

## 2021-02-04 DIAGNOSIS — I480 Paroxysmal atrial fibrillation: Secondary | ICD-10-CM | POA: Diagnosis not present

## 2021-02-04 DIAGNOSIS — Z801 Family history of malignant neoplasm of trachea, bronchus and lung: Secondary | ICD-10-CM | POA: Insufficient documentation

## 2021-02-04 DIAGNOSIS — Z923 Personal history of irradiation: Secondary | ICD-10-CM | POA: Insufficient documentation

## 2021-02-04 DIAGNOSIS — Z170 Estrogen receptor positive status [ER+]: Secondary | ICD-10-CM | POA: Diagnosis not present

## 2021-02-04 DIAGNOSIS — I10 Essential (primary) hypertension: Secondary | ICD-10-CM | POA: Diagnosis not present

## 2021-02-04 DIAGNOSIS — F17210 Nicotine dependence, cigarettes, uncomplicated: Secondary | ICD-10-CM | POA: Diagnosis not present

## 2021-02-04 DIAGNOSIS — Z79899 Other long term (current) drug therapy: Secondary | ICD-10-CM | POA: Diagnosis not present

## 2021-02-04 DIAGNOSIS — M1990 Unspecified osteoarthritis, unspecified site: Secondary | ICD-10-CM | POA: Insufficient documentation

## 2021-02-04 DIAGNOSIS — Z79810 Long term (current) use of selective estrogen receptor modulators (SERMs): Secondary | ICD-10-CM | POA: Insufficient documentation

## 2021-02-04 DIAGNOSIS — R1032 Left lower quadrant pain: Secondary | ICD-10-CM | POA: Diagnosis not present

## 2021-02-04 DIAGNOSIS — Z7901 Long term (current) use of anticoagulants: Secondary | ICD-10-CM | POA: Insufficient documentation

## 2021-02-04 LAB — CBC WITH DIFFERENTIAL (CANCER CENTER ONLY)
Abs Immature Granulocytes: 0.05 10*3/uL (ref 0.00–0.07)
Basophils Absolute: 0.1 10*3/uL (ref 0.0–0.1)
Basophils Relative: 1 %
Eosinophils Absolute: 0.1 10*3/uL (ref 0.0–0.5)
Eosinophils Relative: 2 %
HCT: 38.5 % (ref 36.0–46.0)
Hemoglobin: 12.4 g/dL (ref 12.0–15.0)
Immature Granulocytes: 1 %
Lymphocytes Relative: 31 %
Lymphs Abs: 2 10*3/uL (ref 0.7–4.0)
MCH: 31.4 pg (ref 26.0–34.0)
MCHC: 32.2 g/dL (ref 30.0–36.0)
MCV: 97.5 fL (ref 80.0–100.0)
Monocytes Absolute: 0.6 10*3/uL (ref 0.1–1.0)
Monocytes Relative: 10 %
Neutro Abs: 3.6 10*3/uL (ref 1.7–7.7)
Neutrophils Relative %: 55 %
Platelet Count: 210 10*3/uL (ref 150–400)
RBC: 3.95 MIL/uL (ref 3.87–5.11)
RDW: 13.8 % (ref 11.5–15.5)
WBC Count: 6.5 10*3/uL (ref 4.0–10.5)
nRBC: 0 % (ref 0.0–0.2)

## 2021-02-04 LAB — CMP (CANCER CENTER ONLY)
ALT: 15 U/L (ref 0–44)
AST: 12 U/L — ABNORMAL LOW (ref 15–41)
Albumin: 3.6 g/dL (ref 3.5–5.0)
Alkaline Phosphatase: 62 U/L (ref 38–126)
Anion gap: 10 (ref 5–15)
BUN: 30 mg/dL — ABNORMAL HIGH (ref 8–23)
CO2: 28 mmol/L (ref 22–32)
Calcium: 9.2 mg/dL (ref 8.9–10.3)
Chloride: 101 mmol/L (ref 98–111)
Creatinine: 1.12 mg/dL — ABNORMAL HIGH (ref 0.44–1.00)
GFR, Estimated: 56 mL/min — ABNORMAL LOW (ref >60)
Glucose, Bld: 111 mg/dL — ABNORMAL HIGH (ref 70–99)
Potassium: 4 mmol/L (ref 3.5–5.1)
Sodium: 139 mmol/L (ref 135–145)
Total Bilirubin: 0.4 mg/dL (ref 0.3–1.2)
Total Protein: 7 g/dL (ref 6.5–8.1)

## 2021-02-04 MED ORDER — GABAPENTIN 300 MG PO CAPS: 300.0000 mg | ORAL_CAPSULE | Freq: Every day | ORAL | 4 refills | Status: DC

## 2021-02-04 MED ORDER — TAMOXIFEN CITRATE 20 MG PO TABS: 20.0000 mg | ORAL_TABLET | Freq: Every day | ORAL | 4 refills | Status: DC

## 2021-02-05 ENCOUNTER — Telehealth: Payer: Self-pay | Admitting: Oncology

## 2021-02-05 NOTE — Telephone Encounter (Signed)
Scheduled appts per 2/10 los. Left voicemail with appt date and time.

## 2021-04-19 ENCOUNTER — Ambulatory Visit: Payer: 59 | Admitting: Orthopaedic Surgery

## 2021-05-26 ENCOUNTER — Ambulatory Visit: Payer: 59 | Admitting: Orthopaedic Surgery

## 2021-06-07 ENCOUNTER — Encounter: Payer: Self-pay | Admitting: Orthopaedic Surgery

## 2021-06-07 ENCOUNTER — Ambulatory Visit: Payer: Self-pay

## 2021-06-07 ENCOUNTER — Ambulatory Visit (INDEPENDENT_AMBULATORY_CARE_PROVIDER_SITE_OTHER): Payer: 59

## 2021-06-07 ENCOUNTER — Ambulatory Visit (INDEPENDENT_AMBULATORY_CARE_PROVIDER_SITE_OTHER): Payer: 59 | Admitting: Orthopaedic Surgery

## 2021-06-07 ENCOUNTER — Other Ambulatory Visit: Payer: Self-pay

## 2021-06-07 DIAGNOSIS — M25551 Pain in right hip: Secondary | ICD-10-CM

## 2021-06-07 DIAGNOSIS — Z96642 Presence of left artificial hip joint: Secondary | ICD-10-CM

## 2021-06-07 DIAGNOSIS — M1611 Unilateral primary osteoarthritis, right hip: Secondary | ICD-10-CM | POA: Diagnosis not present

## 2021-06-07 NOTE — Progress Notes (Signed)
Office Visit Note   Patient: Caroline [name]           Date of Birth: Aug 30, 1956           MRN: [id number] Visit Date: 06/07/2021              Requested by: Aura Dials, PA-C [place],  [place] [id number] PCP: Aura Dials, PA-C   Assessment & Plan: Visit Diagnoses:  1. Status post total replacement of left hip   2. Pain in right hip   3. Unilateral primary osteoarthritis, right hip     Plan: I have now monitored her right hip for 4 years now and her pain is gotten significantly worse with that right hip and her x-rays have gotten significantly worse in terms of held worse the arthritis is gotten with significant loss of joint space at this point.  Now she has failed all forms of conservative treatment and her pain is severe enough and her x-ray findings are severe enough that we recommend hip replacement surgery.  She has tried and failed conservative treatment for now over 3 years and this is worsening.  Her signs and symptoms of pain are bad and her clinical exam is bad enough that we are recommending hip replacement surgery.  Having failed all forms conservative treatment for well over 3 years this is warranted as well.  All question concerns were answered and addressed.  We will work on getting the surgery scheduled.  Follow-Up Instructions: Return for 2 weeks post-op.   Orders:  Orders Placed This Encounter  Procedures   XR HIP UNILAT W OR W/O PELVIS 2-3 VIEWS LEFT   XR HIP UNILAT W OR W/O PELVIS 2-3 VIEWS RIGHT   No orders of the defined types were placed in this encounter.     Procedures: No procedures performed   Clinical Data: No additional findings.   Subjective: Chief Complaint  Patient presents with   Right Hip - Pain   Left Hip - Pain  The patient is well-known to me.  I have replaced her left hip in 2018.  Her right hip is been hurting since then and even in 2019 x-rayed the hip and compared to films in 2018 there showed significant  narrowing of the joint space.  At this point she is walking with a Trendelenburg great due to the severity of the pain in her right hip with pain in the groin and stiffness with that hip.  She has been having some left hip pain in the groin on the side that replaced in 2018.  She has tried activity modification and taking anti-inflammatories.  She is also been on pain medication.  She is not obese.  She has tried anti-inflammatories and is in pain management as well.  She is on tamoxifen for breast cancer treatment.  At this point her right hip pain is detrimentally affecting her mobility, quality of life and actives daily living.  It is 10 out of 10 on a daily basis and she has tried and failed conservative treatment for the right hip for now over 3 years.  She has had steroid injections in the past as well.  She is not a diabetic.  HPI  Review of Systems There is currently listed no headache, chest pain, shortness of breath, fever, chills, nausea, vomiting  Objective: Vital Signs: LMP 11/25/2010   Physical Exam She is alert and orient x3 and in no acute distress Ortho Exam Examination of  her left hip shows the move smoothly and fluidly.  There is just a touch of pain in the groin and this may be from the psoas tendon that is irritated.  The right hip has severe pain with attempts of internal extra rotation as well as stiffness with rotation. Specialty Comments:  No specialty comments available.  Imaging: XR HIP UNILAT W OR W/O PELVIS 2-3 VIEWS LEFT  Result Date: 06/07/2021 An AP pelvis and lateral of the left hip shows a well-seated total hip arthroplasty on the left side with no complicating features or evidence of loosening.  There is severe arthritis of the right hip.  XR HIP UNILAT W OR W/O PELVIS 2-3 VIEWS RIGHT  Result Date: 06/07/2021 An AP pelvis and lateral of the right hip shows significant and severe end-stage arthritis of the right hip.  There is complete loss of joint space.   There are particular osteophytes.  There is flattening of the femoral head.  There are cystic changes in the femoral head and acetabulum.  There is also sclerotic changes.    PMFS History: Patient Active Problem List   Diagnosis Date Noted   Unilateral primary osteoarthritis, right hip 06/07/2021   Genetic testing 10/07/2020   Family history of breast cancer    Family history of melanoma    Family history of lung cancer    Atrial fibrillation by electrocardiogram ([place]) 01/10/2018   Status post total replacement of left hip 12/01/2017   Pain in left hip 10/05/2017   Unilateral primary osteoarthritis, left hip 10/05/2017   Carcinoma of lower-outer quadrant of right breast in female, estrogen receptor positive ([place]) 08/30/2017   Hyperlipidemia 01/03/2013   Chest pain 11/26/2011   Tobacco abuse 11/26/2011   HTN (hypertension) 11/26/2011   Past Medical History:  Diagnosis Date   Arthritis    hips   Cancer ([place])    Left breast   Family history of breast cancer    Family history of lung cancer    Family history of melanoma    History of kidney stones    Hypertension    Paroxysmal atrial fibrillation ([place])    Personal history of radiation therapy    S/P radiation therapy 10/10/17- 11/22/17   Right Breast/ 50 gy in 25 fractions, Right Breast boost/ 10 Gy in 5 fractions.    Family History  Problem Relation Age of Onset   Breast cancer Cousin        dx. in her 68s (paternal first cousin)   Emphysema Mother    Dementia Father    Melanoma Brother        dx. in his 56s or 67s   Lung cancer Paternal Aunt        dx. late 50s, smoker    Past Surgical History:  Procedure Laterality Date   BREAST BIOPSY Right 08/02/2017   BREAST LUMPECTOMY Right 08/17/2017   BREAST LUMPECTOMY WITH RADIOACTIVE SEED AND SENTINEL LYMPH NODE BIOPSY Right 08/17/2017   Procedure: RIGHT BREAST RADIOACTIVE SEED LOCALIZED LUMPECTOMY AND SENTINEL LYMPH NODE BIOPSY;  Surgeon: Erroll Luna, MD;  Location:  [facility];  Service: General;  Laterality: Right;   CESAREAN SECTION  [phone]   COLONOSCOPY     HEMORRHOID SURGERY     20 years ago   RE-EXCISION OF BREAST LUMPECTOMY Right 09/06/2017   Procedure: RE-EXCISION OF RIGHT BREAST LUMPECTOMY;  Surgeon: Erroll Luna, MD;  Location: [facility];  Service: General;  Laterality: Right;   TOE SURGERY Right  5th 20 years ago   TOTAL HIP ARTHROPLASTY Left 12/01/2017   Procedure: LEFT TOTAL HIP ARTHROPLASTY ANTERIOR APPROACH;  Surgeon: Mcarthur Rossetti, MD;  Location[facility];  Service: Orthopedics;  Laterality: Left;   Social History   Occupational History   Not on file  Tobacco Use   Smoking status: Every Day    Packs/day: 0.50    Years: 40.00    Pack years: 20.00    Types: Cigarettes   Smokeless tobacco: Never  Vaping Use   Vaping Use: Never used  Substance and Sexual Activity   Alcohol use: Yes    Alcohol/week: 7.0 standard drinks    Types: 7 Glasses of wine per week    Comment: wine nightly   Drug use: No   Sexual activity: Yes    Birth control/protection: Post-menopausal

## 2021-06-11 ENCOUNTER — Other Ambulatory Visit (HOSPITAL_COMMUNITY): Payer: Self-pay | Admitting: Nurse Practitioner

## 2021-07-01 ENCOUNTER — Telehealth: Payer: Self-pay | Admitting: Oncology

## 2021-07-01 NOTE — Telephone Encounter (Signed)
Rescheduled appointment per provider. Patient is aware. 

## 2021-07-23 ENCOUNTER — Other Ambulatory Visit: Payer: Self-pay | Admitting: Oncology

## 2021-07-23 DIAGNOSIS — Z1231 Encounter for screening mammogram for malignant neoplasm of breast: Secondary | ICD-10-CM

## 2021-08-23 ENCOUNTER — Ambulatory Visit: Payer: 59 | Admitting: Oncology

## 2021-08-23 ENCOUNTER — Other Ambulatory Visit: Payer: 59

## 2021-08-27 ENCOUNTER — Other Ambulatory Visit: Payer: Self-pay | Admitting: *Deleted

## 2021-08-27 DIAGNOSIS — Z170 Estrogen receptor positive status [ER+]: Secondary | ICD-10-CM

## 2021-08-27 DIAGNOSIS — C50511 Malignant neoplasm of lower-outer quadrant of right female breast: Secondary | ICD-10-CM

## 2021-08-30 NOTE — Progress Notes (Signed)
[place]  [phone] [phone]) [phone]     ID: NORMAN BIER DOB: 31-Dec-1955  MR#: [id number]  GOT#:[id number]  Patient Care Team: Selinda Orion as PCP - General (Physician Assistant) Kele Withem, Virgie Dad, MD as Consulting Physician (Oncology) Erroll Luna, MD as Consulting Physician (General Surgery) Haygood, Seymour Bars, MD (Inactive) as Consulting Physician (Obstetrics and Gynecology) Donzetta Sprung., MD as Physician Assistant (Sports Medicine) Eppie Gibson, MD as Attending Physician (Radiation Oncology) Juanita Craver, MD as Consulting Physician (Gastroenterology) Thompson Grayer, MD as Consulting Physician (Cardiology) Mcarthur Rossetti, MD as Consulting Physician (Orthopedic Surgery) Chauncey Cruel, MD OTHER MD:  CHIEF COMPLAINT: Estrogen receptor positive breast cancer  CURRENT TREATMENT: tamoxifen   INTERVAL HISTORY: Shelbi returns today for follow-up of her estrogen receptor positive breast cancer.   She continues on tamoxifen.  She is having more hot flashes than before, she is not sure why.  Vaginal wetness is not a major issue.  She is scheduled for annual screening mammogram on 09/10/2021.   REVIEW OF SYSTEMS: Shonteria continues to have chronic pain related to her hips.  Recall that she is status post left hip replacement and needs a right hip replacement.  She is followed by Zollie Beckers for that.  She goes to a pain clinic in [place] and they recently changed her medication some which is updated in the medication list.  She has a "swimmy right ear" she wanted me to look at.  A detailed review of systems today was otherwise stable   COVID 19 VACCINATION STATUS: Status post Pfizer x2 with booster in August, possible infection December 2021   HISTORY OF CURRENT ILLNESS: From the original intake note:  Illene (last name is pronounced muh-REE--kuh) had routine screening mammography with tomography at the [facility] 07/21/2017, showing the breast density to be category C. An area of possible distortion was noted in the right breast, and on 07/28/2017 she underwent right diagnostic mammography with tomography and right breast ultrasonography. This confirmed an area of distortion in the outer right breast which by exam showed acid focal thickening at the 8:30 position, 4 cm from the nipple. Ultrasound showed 3 separate irregular hypoechoic areas in this region, measuring 0.9, 0.7 and 0.5 cm. Taken together these 2 areas at up to 2.1 cm in greatest diameter. There was no abnormal right axillary adenopathy.  2 of these masses were biopsied 08/02/2017. The final pathology ([id number]) showed both to consist of invasive ductal carcinoma, grade 2 with the prognostic panel (from the lesion at 8:30 o'clock 4 cm from the nipple) was estrogen receptor 90% positive, progesterone receptor 20% positive, both with strong staining intensity, with an MIB-1 of 15%, and no HER-2 complication, the signals ratio being 1.00 and the number per cell 1.05.  The patient proceeded to right lumpectomy and axillary sentinel lymph node sampling 08/17/2017, with the final pathology ([id number]) showing 2 areas of invasive ductal carcinoma, grade 2, measuring 1.5 and 0.7 cm. Margins were broadly positive medially and focally laterally. The single sentinel lymph node was involved by tumor.  The patient's subsequent history is as detailed below.   PAST MEDICAL HISTORY: Past Medical History:  Diagnosis Date   Arthritis    hips   Cancer ([place])    Left breast   Family history of breast cancer    Family history of lung cancer    Family history of melanoma    History of kidney stones    Hypertension  Paroxysmal atrial fibrillation (HCC)    Personal history of radiation therapy    S/P radiation therapy 10/10/17- 11/22/17   Right Breast/ 50 gy in 25 fractions, Right Breast boost/ 10 Gy in 5 fractions.    PAST SURGICAL  HISTORY: Past Surgical History:  Procedure Laterality Date   BREAST BIOPSY Right 08/02/2017   BREAST LUMPECTOMY Right 08/17/2017   BREAST LUMPECTOMY WITH RADIOACTIVE SEED AND SENTINEL LYMPH NODE BIOPSY Right 08/17/2017   Procedure: RIGHT BREAST RADIOACTIVE SEED LOCALIZED LUMPECTOMY AND SENTINEL LYMPH NODE BIOPSY;  Surgeon: Erroll Luna, MD;  Location: [place];  Service: General;  Laterality: Right;   CESAREAN SECTION  [id number]   COLONOSCOPY     HEMORRHOID SURGERY     20 years ago   RE-EXCISION OF BREAST LUMPECTOMY Right 09/06/2017   Procedure: RE-EXCISION OF RIGHT BREAST LUMPECTOMY;  Surgeon: Erroll Luna, MD;  Location: [place];  Service: General;  Laterality: Right;   TOE SURGERY Right    5th 20 years ago   TOTAL HIP ARTHROPLASTY Left 12/01/2017   Procedure: LEFT TOTAL HIP ARTHROPLASTY ANTERIOR APPROACH;  Surgeon: Mcarthur Rossetti, MD;  Location: [facility];  Service: Orthopedics;  Laterality: Left;    FAMILY HISTORY Family History  Problem Relation Age of Onset   Breast cancer Cousin        dx. in her 60s (paternal first cousin)   Emphysema Mother    Dementia Father    Melanoma Brother        dx. in his 65s or 81s   Lung cancer Paternal Aunt        dx. late 45s, smoker  The patient's father died from dementia at the age of 13. The patient's mother died from emphysema at the age of 68. The patient has 2 brothers, no sisters. On the paternal side a first cousin was diagnosed with breast cancer at the age of 34. There is no other breast ovarian or prostate cancer in the family but the patient is aware it is a small family. The patient's brother living in [place] had a melanoma diagnosed in his 40s   GYNECOLOGIC HISTORY:  Patient's last menstrual period was 11/25/2010. Menarche age 46, first live birth age 37, she is [place] P3. The middle child was stillborn. She stopped having periods in her late 52s. She did not take hormone replacement.  She took oral contraceptives for approximately 15 years remotely with no complications.   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  She retired in 2021.  Her husband Merry Proud is self-employed in Sales promotion account executive. Daughter Benjie Karvonen graduated from [employer] in exercise and sports signs.  She married May 2021.  Son Ellard Artis is currently working with his father.  He is 22 as of February 2021 and lives at home.  The patient is not a church attender.    ADVANCED DIRECTIVES: In the absence of any documents to the contrary the patient's husband is her healthcare power of attorney   HEALTH MAINTENANCE: Social History   Tobacco Use   Smoking status: Every Day    Packs/day: 0.50    Years: 40.00    Pack years: 20.00    Types: Cigarettes   Smokeless tobacco: Never  Vaping Use   Vaping Use: Never used  Substance Use Topics   Alcohol use: Yes    Alcohol/week: 7.0 standard drinks    Types: 7 Glasses of wine per week  Comment: wine nightly   Drug use: No     Colonoscopy:/ [facility]  PAP:  Bone density: remote   Allergies  Allergen Reactions   Sulfa Antibiotics Other (See Comments)    Unknown, was child    Current Outpatient Medications  Medication Sig Dispense Refill   buPROPion (WELLBUTRIN XL) 150 MG 24 hr tablet Take 150 mg by mouth daily.     gabapentin (NEURONTIN) 300 MG capsule Take 1 capsule (300 mg total) by mouth at bedtime. 90 capsule 4   HYDROcodone-acetaminophen (NORCO/VICODIN) 5-325 MG tablet Take 1-2 tablets by mouth 2 (two) times daily as needed for moderate pain. 30 tablet 0   metoprolol succinate (TOPROL-XL) 25 MG 24 hr tablet Take 1 tablet (25 mg total) by mouth in the morning and at bedtime. Future refills from pcp 60 tablet 0   tamoxifen (NOLVADEX) 20 MG tablet Take 1 tablet (20 mg total) by mouth daily. 90 tablet 4   valsartan-hydrochlorothiazide (DIOVAN-HCT) 160-12.5 MG tablet Take 1 tablet by mouth daily.      No current facility-administered medications for this visit.    OBJECTIVE: White woman who appears stated age  28:   08/31/21 1327  BP: 96/62  Pulse: 95  Resp: 18  Temp: (!) 97.5 F (36.4 C)  SpO2: 96%      Body mass index is 32.13 kg/m.   Wt Readings from Last 3 Encounters:  08/31/21 187 lb 3.2 oz (84.9 kg)  02/04/20 184 lb 11.2 oz (83.8 kg)  12/31/18 181 lb (82.1 kg)      ECOG FS:1 - Symptomatic but completely ambulatory  Sclerae unicteric, EOMs intact Wearing a mask No cervical or supraclavicular adenopathy; right ear shows considerable wax accumulation Lungs no rales or rhonchi Heart regular rate and rhythm Abd soft, nontender, positive bowel sounds MSK no focal spinal tenderness, walks with a marked limp Neuro: nonfocal, well oriented, appropriate affect Breasts: The right breast has undergone lumpectomy and radiation.  There is significant distortion of the contour.  There is no evidence of local recurrence.  The left breast and both axillae are benign   LAB RESULTS:   CMP     Component Value Date/Time   NA 139 02/04/2021 1335   NA 140 08/31/2017 1616   K 4.0 02/04/2021 1335   K 3.9 08/31/2017 1616   CL 101 02/04/2021 1335   CO2 28 02/04/2021 1335   CO2 27 08/31/2017 1616   GLUCOSE 111 (H) 02/04/2021 1335   GLUCOSE 139 08/31/2017 1616   BUN 30 (H) 02/04/2021 1335   BUN 45.6 (H) 08/31/2017 1616   CREATININE 1.12 (H) 02/04/2021 1335   CREATININE 1.6 (H) 08/31/2017 1616   CALCIUM 9.2 02/04/2021 1335   CALCIUM 10.0 08/31/2017 1616   PROT 7.0 02/04/2021 1335   PROT 7.2 08/31/2017 1616   ALBUMIN 3.6 02/04/2021 1335   ALBUMIN 3.5 08/31/2017 1616   AST 12 (L) 02/04/2021 1335   AST 12 08/31/2017 1616   ALT 15 02/04/2021 1335   ALT 14 08/31/2017 1616   ALKPHOS 62 02/04/2021 1335   ALKPHOS 76 08/31/2017 1616   BILITOT 0.4 02/04/2021 1335   BILITOT 0.25 08/31/2017 1616   GFRNONAA 56 (L) 02/04/2021 1335   GFRAA >60 02/04/2020 1347   GFRAA 57 (L)  01/10/2018 1307    No results found for: TOTALPROTELP, ALBUMINELP, A1GS, A2GS, BETS, BETA2SER, GAMS, MSPIKE, SPEI  No results found for: KPAFRELGTCHN, LAMBDASER, KAPLAMBRATIO  Lab Results  Component Value Date   WBC 6.3 08/31/2021  NEUTROABS 3.7 08/31/2021   HGB 13.0 08/31/2021   HCT 37.7 08/31/2021   MCV 93.5 08/31/2021   PLT 164 08/31/2021      Chemistry      Component Value Date/Time   NA 139 02/04/2021 1335   NA 140 08/31/2017 1616   K 4.0 02/04/2021 1335   K 3.9 08/31/2017 1616   CL 101 02/04/2021 1335   CO2 28 02/04/2021 1335   CO2 27 08/31/2017 1616   BUN 30 (H) 02/04/2021 1335   BUN 45.6 (H) 08/31/2017 1616   CREATININE 1.12 (H) 02/04/2021 1335   CREATININE 1.6 (H) 08/31/2017 1616      Component Value Date/Time   CALCIUM 9.2 02/04/2021 1335   CALCIUM 10.0 08/31/2017 1616   ALKPHOS 62 02/04/2021 1335   ALKPHOS 76 08/31/2017 1616   AST 12 (L) 02/04/2021 1335   AST 12 08/31/2017 1616   ALT 15 02/04/2021 1335   ALT 14 08/31/2017 1616   BILITOT 0.4 02/04/2021 1335   BILITOT 0.25 08/31/2017 1616       No results found for: LABCA2  No components found for: LFYBOF751  No results for input(s): INR in the last 168 hours.  No results found for: LABCA2  No results found for: WCH852  No results found for: DPO242  No results found for: PNT614  No results found for: CA2729  No components found for: HGQUANT  No results found for: CEA1 / No results found for: CEA1   No results found for: AFPTUMOR  No results found for: CHROMOGRNA  No results found for: HGBA, HGBA2QUANT, HGBFQUANT, HGBSQUAN (Hemoglobinopathy evaluation)   No results found for: LDH  No results found for: IRON, TIBC, IRONPCTSAT (Iron and TIBC)  No results found for: FERRITIN  Urinalysis No results found for: COLORURINE, APPEARANCEUR, LABSPEC, PHURINE, GLUCOSEU, HGBUR, BILIRUBINUR, KETONESUR, PROTEINUR, UROBILINOGEN, NITRITE, LEUKOCYTESUR   STUDIES: No results found.     ELIGIBLE FOR AVAILABLE RESEARCH PROTOCOL: no  ASSESSMENT: 65 [place], [place] woman status post right breast upper outer quadrant biopsy 08/02/2017 for a clinical T1c No invasive ductal carcinoma, grade 2, estrogen and progesterone receptor positive, HER-2 not amplified, with an MIB-1 of 15%.  (1) status post right lumpectomy and axillary sentinel lymph node sampling 08/17/2017 for an mpT1c pN1, stage IB invasive ductal carcinoma, grade 2, with positive margins  (2) additional surgery 09/06/2017 successfully cleared margins.  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 The variant allele frequency (VAF) for the c.542G>C variant was 18-28%, while the VAF for the c.818G>A variant was 10-20  (a) no additional deleterious mutations were found in APC, ATM, AXIN2, BARD1, BMPR1A, BRCA1, BRCA2, BRIP1, CDH1, CDK4, CDKN2A (p14ARF), CDKN2A (p16INK4a), CHEK2, CTNNA1, DICER1, EPCAM (Deletion/duplication testing only), GREM1 (promoter region deletion/duplication testing only), KIT, MEN1, MLH1, MSH2, MSH3, MSH6, MUTYH, NBN, NF1, NTHL1, PALB2, PDGFRA, PMS2, POLD1, POLE, PTEN, RAD50, RAD51C, RAD51D, RNF43, SDHB, SDHC, SDHD, SMAD4, SMARCA4. STK11, TP53, TSC1, TSC2, and VHL.  The following genes were evaluated for sequence changes only: SDHA and HOXB13 c.251G>A variant only  (  7) paroxysmal atrial fibrillation: on lifelong anticoagulation (apixaban)   PLAN: Brandis is now 4-1/2 years  out from definitive surgery for her breast cancer with no evidence of disease recurrence.  This is very favorable.  She is tolerating tamoxifen well and the plan is to continue that through October 2023 at which time she will be ready to "graduate" from follow-up here.  Unfortunately I did not have a simple solution for her right earwax problem.  She may check with her primary care physician to see if she cleans ears, which I am not good at.  Alternatively she could go to an ENT for more specialized care.  I wrote her a prescription for bras and she will check at [facility] to see what is available for her.  Otherwise she will see [place] again in 1 year, after next year's mammogram.  She knows to call for any other issue that may develop before then.  Total encounter time 20 minutes.*   Pankaj Haack, Virgie Dad, MD  08/31/21 1:43 PM Medical Oncology and Hematology [facility] [place], [place] [address] Tel. [phone]    Fax. [phone]   I, Wilburn Mylar, am acting as scribe for Dr. Virgie Dad. Mehdi Gironda.  I, Lurline Del MD, have reviewed the above documentation for accuracy and completeness, and I agree with the above.   *Total Encounter Time as defined by the Centers for Medicare and Medicaid Services includes, in addition to the face-to-face time of a patient visit (documented in the note above) non-face-to-face time: obtaining and reviewing outside history, ordering and reviewing medications, tests or procedures, care coordination (communications with other health care professionals or caregivers) and documentation in the medical record.

## 2021-08-31 ENCOUNTER — Inpatient Hospital Stay: Payer: 59 | Attending: Oncology

## 2021-08-31 ENCOUNTER — Inpatient Hospital Stay (HOSPITAL_BASED_OUTPATIENT_CLINIC_OR_DEPARTMENT_OTHER): Payer: 59 | Admitting: Oncology

## 2021-08-31 ENCOUNTER — Other Ambulatory Visit: Payer: Self-pay

## 2021-08-31 VITALS — BP 96/62 | HR 95 | Temp 97.5°F | Resp 18 | Ht 64.0 in | Wt 187.2 lb

## 2021-08-31 DIAGNOSIS — Z923 Personal history of irradiation: Secondary | ICD-10-CM | POA: Diagnosis not present

## 2021-08-31 DIAGNOSIS — Z7901 Long term (current) use of anticoagulants: Secondary | ICD-10-CM | POA: Diagnosis not present

## 2021-08-31 DIAGNOSIS — I480 Paroxysmal atrial fibrillation: Secondary | ICD-10-CM | POA: Diagnosis not present

## 2021-08-31 DIAGNOSIS — Z170 Estrogen receptor positive status [ER+]: Secondary | ICD-10-CM

## 2021-08-31 DIAGNOSIS — Z79810 Long term (current) use of selective estrogen receptor modulators (SERMs): Secondary | ICD-10-CM | POA: Diagnosis not present

## 2021-08-31 DIAGNOSIS — C50511 Malignant neoplasm of lower-outer quadrant of right female breast: Secondary | ICD-10-CM | POA: Diagnosis not present

## 2021-08-31 DIAGNOSIS — H6121 Impacted cerumen, right ear: Secondary | ICD-10-CM | POA: Diagnosis not present

## 2021-08-31 DIAGNOSIS — C50411 Malignant neoplasm of upper-outer quadrant of right female breast: Secondary | ICD-10-CM | POA: Insufficient documentation

## 2021-08-31 LAB — CBC WITH DIFFERENTIAL (CANCER CENTER ONLY)
Abs Immature Granulocytes: 0.03 10*3/uL (ref 0.00–0.07)
Basophils Absolute: 0.1 10*3/uL (ref 0.0–0.1)
Basophils Relative: 1 %
Eosinophils Absolute: 0.2 10*3/uL (ref 0.0–0.5)
Eosinophils Relative: 2 %
HCT: 37.7 % (ref 36.0–46.0)
Hemoglobin: 13 g/dL (ref 12.0–15.0)
Immature Granulocytes: 1 %
Lymphocytes Relative: 28 %
Lymphs Abs: 1.8 10*3/uL (ref 0.7–4.0)
MCH: 32.3 pg (ref 26.0–34.0)
MCHC: 34.5 g/dL (ref 30.0–36.0)
MCV: 93.5 fL (ref 80.0–100.0)
Monocytes Absolute: 0.7 10*3/uL (ref 0.1–1.0)
Monocytes Relative: 10 %
Neutro Abs: 3.7 10*3/uL (ref 1.7–7.7)
Neutrophils Relative %: 58 %
Platelet Count: 164 10*3/uL (ref 150–400)
RBC: 4.03 MIL/uL (ref 3.87–5.11)
RDW: 13.7 % (ref 11.5–15.5)
WBC Count: 6.3 10*3/uL (ref 4.0–10.5)
nRBC: 0 % (ref 0.0–0.2)

## 2021-08-31 LAB — CMP (CANCER CENTER ONLY)
ALT: 15 U/L (ref 0–44)
AST: 13 U/L — ABNORMAL LOW (ref 15–41)
Albumin: 3.8 g/dL (ref 3.5–5.0)
Alkaline Phosphatase: 60 U/L (ref 38–126)
Anion gap: 13 (ref 5–15)
BUN: 40 mg/dL — ABNORMAL HIGH (ref 8–23)
CO2: 25 mmol/L (ref 22–32)
Calcium: 9.7 mg/dL (ref 8.9–10.3)
Chloride: 102 mmol/L (ref 98–111)
Creatinine: 1.31 mg/dL — ABNORMAL HIGH (ref 0.44–1.00)
GFR, Estimated: 46 mL/min — ABNORMAL LOW (ref >60)
Glucose, Bld: 126 mg/dL — ABNORMAL HIGH (ref 70–99)
Potassium: 4.7 mmol/L (ref 3.5–5.1)
Sodium: 140 mmol/L (ref 135–145)
Total Bilirubin: 0.5 mg/dL (ref 0.3–1.2)
Total Protein: 7.1 g/dL (ref 6.5–8.1)

## 2021-08-31 MED ORDER — TAMOXIFEN CITRATE 20 MG PO TABS: 20.0000 mg | ORAL_TABLET | Freq: Every day | ORAL | 5 refills | Status: DC

## 2021-09-10 ENCOUNTER — Other Ambulatory Visit: Payer: Self-pay

## 2021-09-10 ENCOUNTER — Ambulatory Visit
Admission: RE | Admit: 2021-09-10 | Discharge: 2021-09-10 | Disposition: A | Discharge status: Home / Self Care | Payer: 59 | Source: Ambulatory Visit | Attending: Oncology | Admitting: Oncology

## 2021-09-10 DIAGNOSIS — Z1231 Encounter for screening mammogram for malignant neoplasm of breast: Secondary | ICD-10-CM

## 2021-10-13 ENCOUNTER — Telehealth: Payer: Self-pay | Admitting: Orthopaedic Surgery

## 2021-10-13 NOTE — Telephone Encounter (Signed)
Patient called advised she would like to be set up for an MRI. Patient said she is having a lot of pain in her left hip. The number to contact patient is [phone]

## 2021-11-02 ENCOUNTER — Ambulatory Visit (INDEPENDENT_AMBULATORY_CARE_PROVIDER_SITE_OTHER): Payer: 59 | Admitting: Orthopaedic Surgery

## 2021-11-02 ENCOUNTER — Encounter: Payer: Self-pay | Admitting: Orthopaedic Surgery

## 2021-11-02 DIAGNOSIS — Z96642 Presence of left artificial hip joint: Secondary | ICD-10-CM

## 2021-11-02 DIAGNOSIS — M25552 Pain in left hip: Secondary | ICD-10-CM | POA: Diagnosis not present

## 2021-11-02 NOTE — Progress Notes (Signed)
HPI: Caroline [name] returns today with left hip pain this been ongoing for 3 years.  She is status post left total hip arthroplasty 12/11/2017.  She did well for the first year and then started developing left hip pain with flexion of the left hip.  Has a lot of pain going up stairs.  Pain is anterior hip and some in the groin area.  She denies any fevers chills.  She has known end-stage arthritis of her right hip.  No known injury to either hip.  She states that the left hip pain is worse than her right hip pain at this point time.  Prior radiographs of her hip performed this June showed no acute abnormalities involving the left hip with well-seated total hip components.  There is slight overhang of the left hip acetabular component.  Severe end-stage arthritis of the right hip noted.  Physical exam: Left hip she is fluid motion with external and internal rotation without significant pain.  Hip flexion passively causes pain in the anterior hip.  She walks without any assistive device and somewhat lurching gait on the left.  No attempts of motion of the right hip today.   Impression: Left hip pain History of left total hip arthroplasty 12/11/2017 Right hip osteoarthritis end-stage  Plan: Recommend MRI of the left hip to evaluate for psoas tendinitis source of her pain.  Have her follow-up after the MRI to go over results discuss further treatment.  Questions were encouraged and answered at length.

## 2021-11-02 NOTE — Addendum Note (Signed)
Addended by: Robyne Peers on: 11/02/2021 10:55 AM   Modules accepted: Orders

## 2021-11-09 ENCOUNTER — Telehealth: Payer: Self-pay | Admitting: Orthopaedic Surgery

## 2021-11-09 NOTE — Telephone Encounter (Signed)
Called pt 1X and left vm for pt to set an MRI review appt after 11/30/21 with Dr. Ninfa Linden or PA Artis Delay

## 2021-11-30 ENCOUNTER — Other Ambulatory Visit: Payer: Self-pay

## 2021-11-30 ENCOUNTER — Ambulatory Visit
Admission: RE | Admit: 2021-11-30 | Discharge: 2021-11-30 | Disposition: A | Discharge status: Home / Self Care | Payer: 59 | Source: Ambulatory Visit | Attending: Physician Assistant | Admitting: Physician Assistant

## 2021-11-30 DIAGNOSIS — Z96642 Presence of left artificial hip joint: Secondary | ICD-10-CM

## 2021-11-30 DIAGNOSIS — M25552 Pain in left hip: Secondary | ICD-10-CM

## 2021-12-02 ENCOUNTER — Ambulatory Visit (INDEPENDENT_AMBULATORY_CARE_PROVIDER_SITE_OTHER): Payer: 59

## 2021-12-02 ENCOUNTER — Encounter: Payer: Self-pay | Admitting: Physician Assistant

## 2021-12-02 ENCOUNTER — Ambulatory Visit (INDEPENDENT_AMBULATORY_CARE_PROVIDER_SITE_OTHER): Payer: 59 | Admitting: Physician Assistant

## 2021-12-02 DIAGNOSIS — M25562 Pain in left knee: Secondary | ICD-10-CM

## 2021-12-02 NOTE — Progress Notes (Signed)
HPI: [name] returns today to go over the MRI evaluation of her left hip pain.  She continues to have left hip pain in the groin region especially with flexion of the left hip.  She underwent left total hip in 2018 did well initially but pain began approximately a year after surgery.  No known injury.  She denies any knee pain.  But she does have trouble going up and down stairs.  MRI is reviewed with the patient actual images reviewed.  Shows severe end-stage arthritis of the right hip.  Left hip status post total hip arthroplasty with no evidence of loosening.  Minimal bony edema lateral aspect of the proximal femoral shaft and femoral stem possibly due to stress reaction.  No joint effusion involving left hip.  Low-grade muscle atrophy involving the left tensor fascia lata.  Physical exam: General well-developed well-nourished pleasant female no acute distress.  Left hip good range of motion of the left hip without significant pain.  She has pain with active hip flexion.  Passive hip flexion on the left causes no significant pain.  She has good range of motion of the left knee with significant patellofemoral crepitus.  No instability valgus varus stressing.  No abnormal warmth erythema or effusion left knee.  Left knee 2 views: Knee is well located.  Mild to moderate patellofemoral arthritic changes.  Medial lateral joint line well-maintained.  Impression: Left hip pain Status post left total hip arthroplasty Left knee patellofemoral syndrome/osteoarthritis End-stage arthritis right hip  Plan: Recommend cortisone injection of the left knee to see what type of response she has to this and how it affects the pain that she is having in the left hip explained to her that some of the pain may be referred pain from the left knee.  She has significant patellofemoral crepitus on exam today.  Also would recommend physical therapy for hip flexor and quad strengthening of the lower extremities.  She will  call us let us know where she would like to go to physical therapy.  Unfortunately today clinic was running behind and the patient was unable to wait to have cortisone injection in her left knee.  Therefore we will see her back sometime next week and just have her come in for an injection in the left knee.  Questions were encouraged and answered at length today.

## 2021-12-09 ENCOUNTER — Ambulatory Visit: Payer: 59 | Admitting: Physician Assistant

## 2022-02-28 ENCOUNTER — Other Ambulatory Visit: Payer: Self-pay

## 2022-02-28 MED ORDER — TAMOXIFEN CITRATE 20 MG PO TABS: 20.0000 mg | ORAL_TABLET | Freq: Every day | ORAL | 3 refills | Status: AC

## 2022-05-23 IMAGING — MG MM DIGITAL SCREENING BILAT W/ TOMO AND CAD
6 of 10 series · 6 of 30 positions shown · non-contrast
Comparison: Previous exam(s).

CLINICAL DATA: Screening.

EXAM:
DIGITAL SCREENING BILATERAL MAMMOGRAM WITH TOMOSYNTHESIS AND CAD
TECHNIQUE: Bilateral screening digital craniocaudal and mediolateral oblique
mammograms were obtained. Bilateral screening digital breast
tomosynthesis was performed. The images were evaluated with
computer-aided detection.

[R MLO synth-2D (1 of 2)]
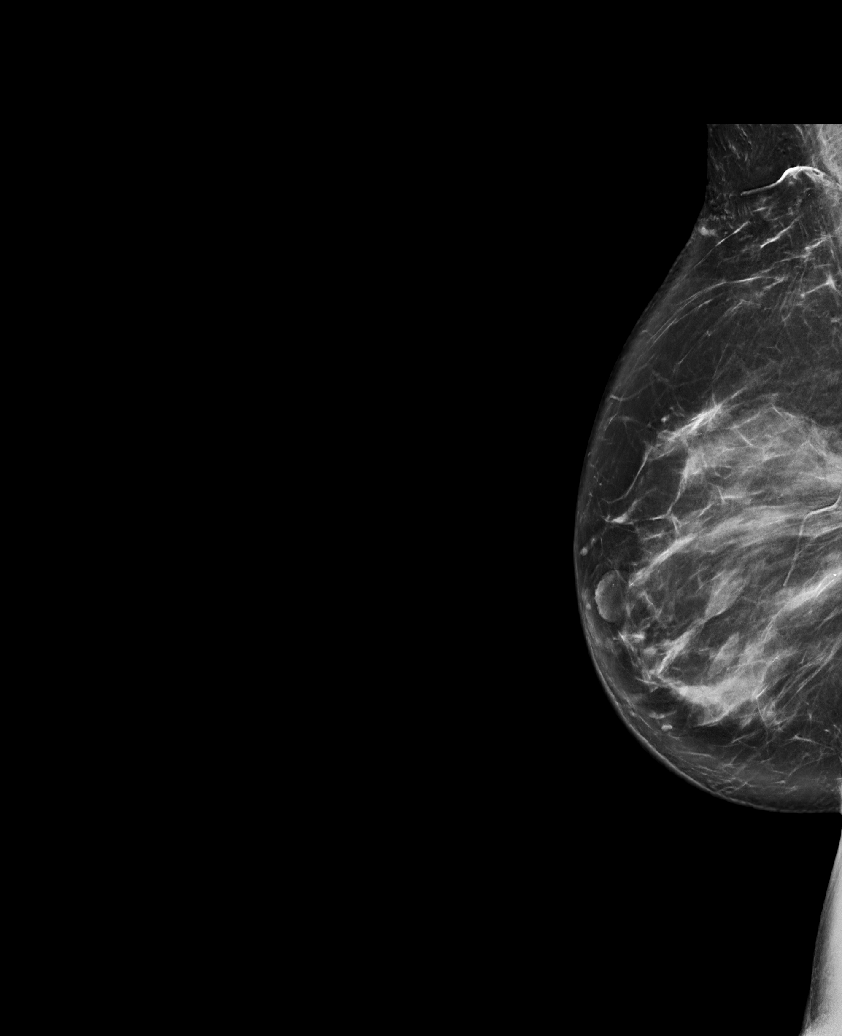

[L MLO synth-2D]
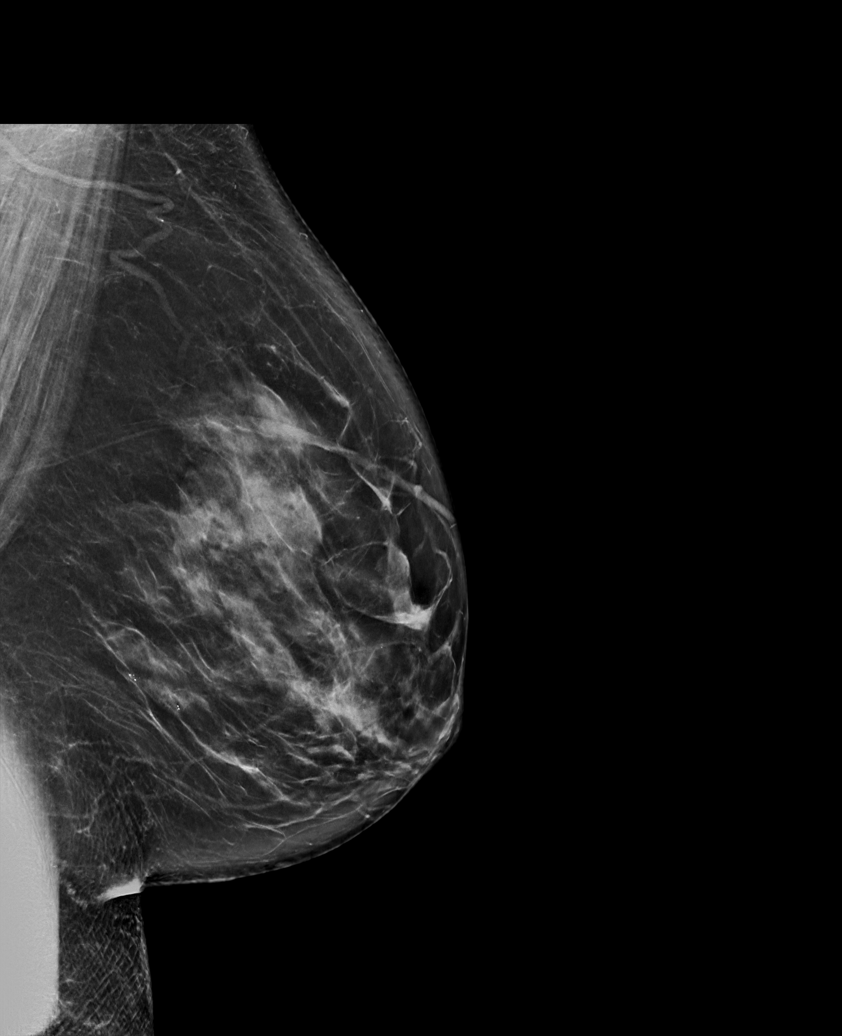

[L CC synth-2D]
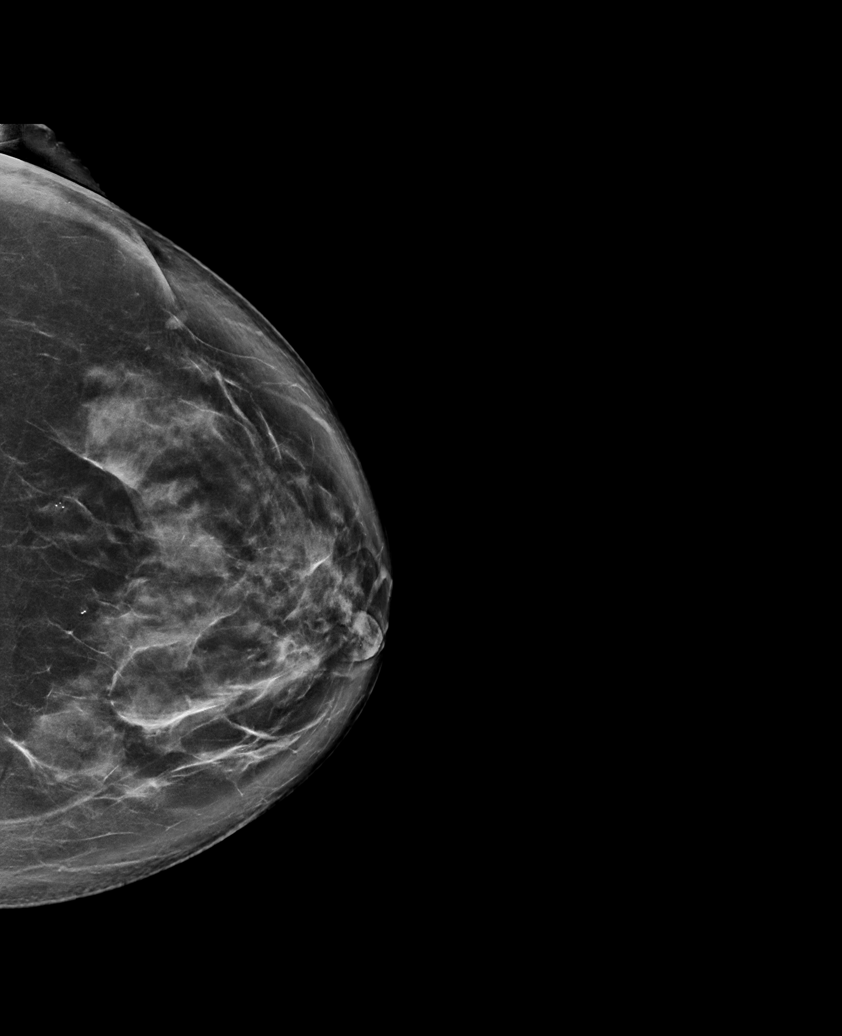

[R MLO synth-2D (2 of 2)]
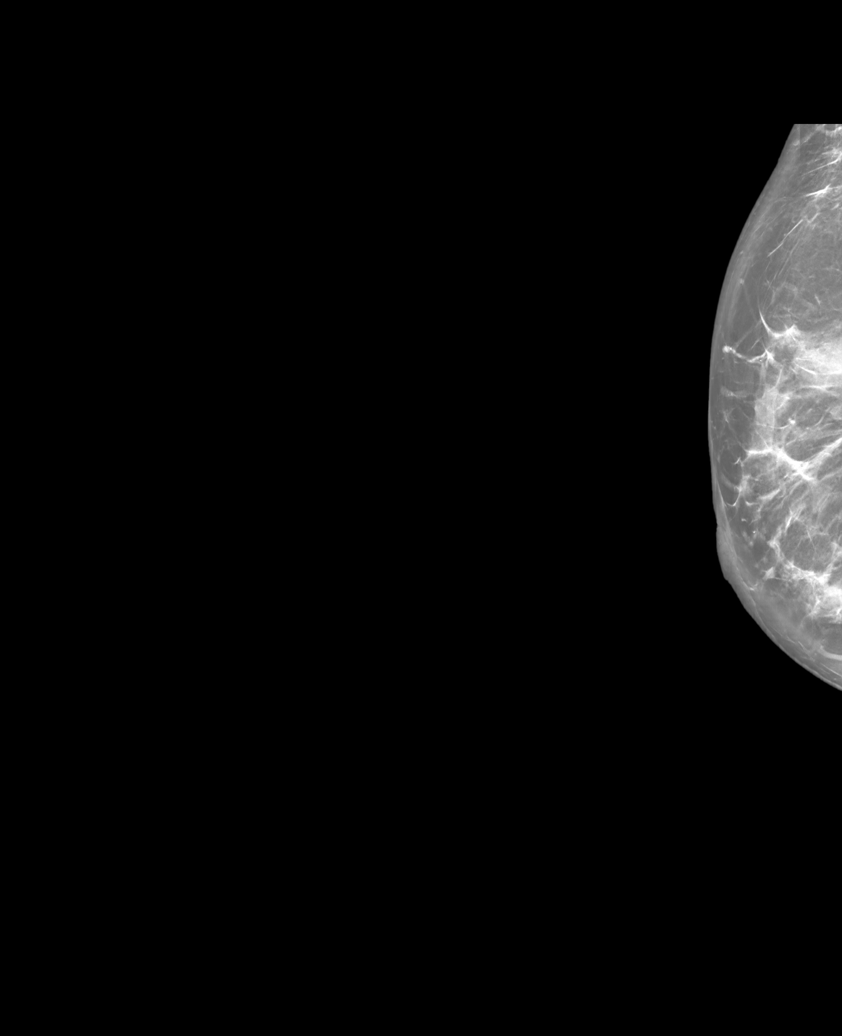

[R CC synth-2D]
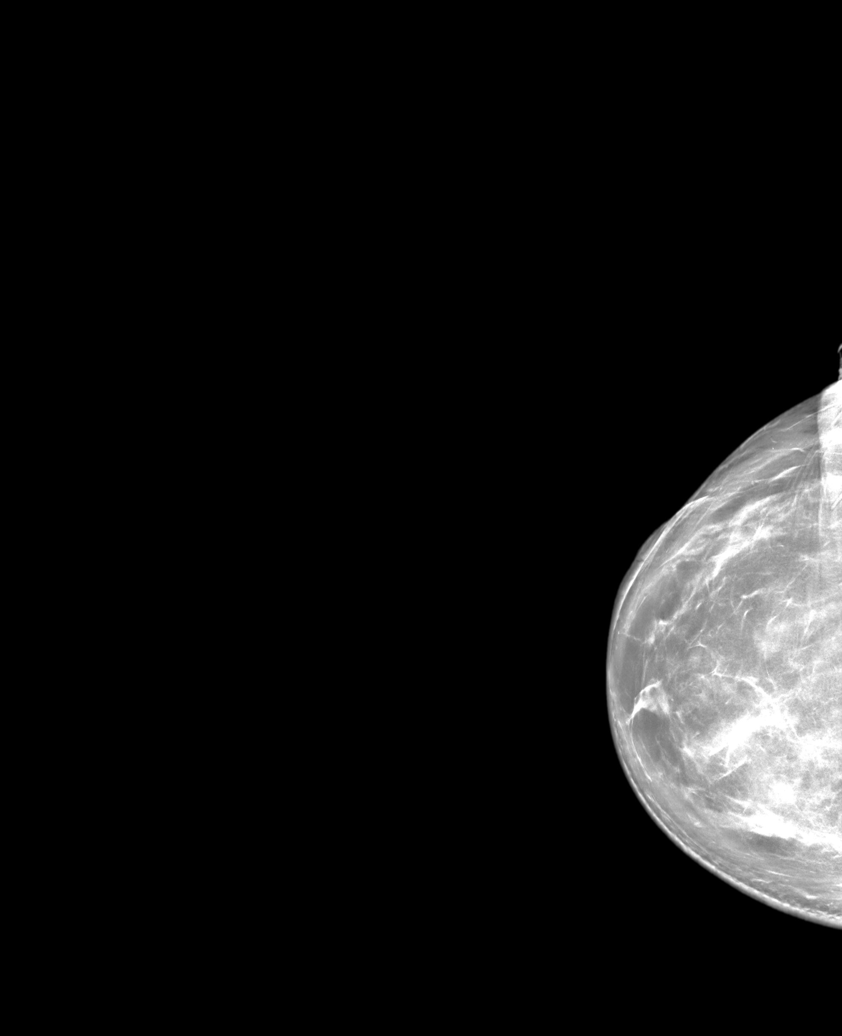

[L CC tomo · tomo slice 47/93.0]
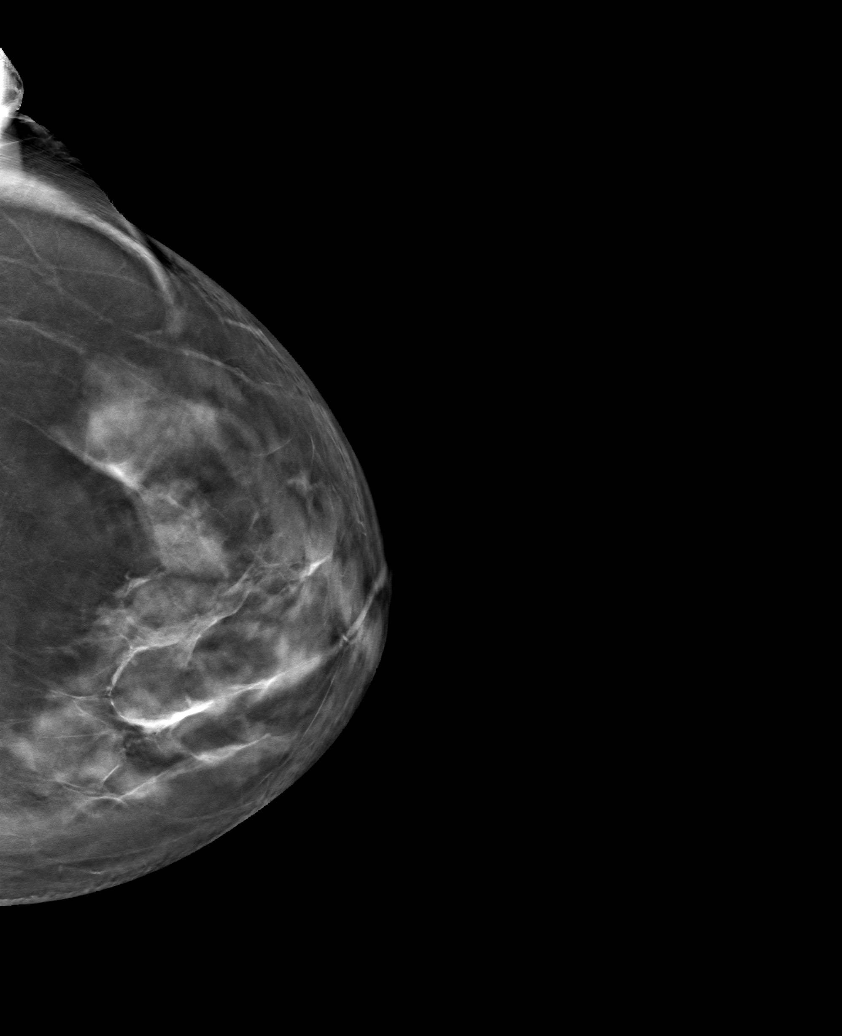

[6 of 30 positions shown; findings below may reference images not displayed]

ACR Breast Density Category c: The breast tissue is heterogeneously
dense, which may obscure small masses.
FINDINGS: There are no findings suspicious for malignancy.
IMPRESSION: No mammographic evidence of malignancy. A result letter of this
screening mammogram will be mailed directly to the patient.

RECOMMENDATION:
Screening mammogram in one year. (Code:Q3-W-BC3)

BI-RADS CATEGORY  1: Negative.

## 2022-09-06 ENCOUNTER — Other Ambulatory Visit: Payer: Self-pay | Admitting: Physician Assistant

## 2022-09-06 DIAGNOSIS — Z1231 Encounter for screening mammogram for malignant neoplasm of breast: Secondary | ICD-10-CM

## 2022-09-15 ENCOUNTER — Ambulatory Visit: Payer: Self-pay

## 2022-09-30 ENCOUNTER — Ambulatory Visit
Admission: RE | Admit: 2022-09-30 | Discharge: 2022-09-30 | Disposition: A | Discharge status: Home / Self Care | Payer: BC Managed Care – PPO | Source: Ambulatory Visit | Attending: Physician Assistant | Admitting: Physician Assistant

## 2022-09-30 DIAGNOSIS — Z1231 Encounter for screening mammogram for malignant neoplasm of breast: Secondary | ICD-10-CM

## 2022-09-30 HISTORY — DX: Malignant neoplasm of unspecified site of unspecified female breast: C50.919

## 2022-10-03 ENCOUNTER — Other Ambulatory Visit: Payer: Self-pay | Admitting: *Deleted

## 2022-10-03 DIAGNOSIS — C50511 Malignant neoplasm of lower-outer quadrant of right female breast: Secondary | ICD-10-CM

## 2022-10-04 ENCOUNTER — Encounter: Payer: Self-pay | Admitting: Hematology and Oncology

## 2022-10-04 ENCOUNTER — Inpatient Hospital Stay: Payer: BC Managed Care – PPO | Attending: Hematology and Oncology

## 2022-10-04 ENCOUNTER — Other Ambulatory Visit: Payer: Self-pay

## 2022-10-04 ENCOUNTER — Inpatient Hospital Stay (HOSPITAL_BASED_OUTPATIENT_CLINIC_OR_DEPARTMENT_OTHER): Payer: BC Managed Care – PPO | Admitting: Hematology and Oncology

## 2022-10-04 VITALS — BP 109/71 | HR 88 | Temp 98.1°F | Resp 16 | Ht 64.0 in | Wt 193.3 lb

## 2022-10-04 DIAGNOSIS — I480 Paroxysmal atrial fibrillation: Secondary | ICD-10-CM | POA: Insufficient documentation

## 2022-10-04 DIAGNOSIS — I10 Essential (primary) hypertension: Secondary | ICD-10-CM | POA: Diagnosis not present

## 2022-10-04 DIAGNOSIS — Z853 Personal history of malignant neoplasm of breast: Secondary | ICD-10-CM | POA: Diagnosis present

## 2022-10-04 DIAGNOSIS — Z79810 Long term (current) use of selective estrogen receptor modulators (SERMs): Secondary | ICD-10-CM | POA: Diagnosis not present

## 2022-10-04 DIAGNOSIS — Z801 Family history of malignant neoplasm of trachea, bronchus and lung: Secondary | ICD-10-CM | POA: Diagnosis not present

## 2022-10-04 DIAGNOSIS — Z923 Personal history of irradiation: Secondary | ICD-10-CM | POA: Diagnosis not present

## 2022-10-04 DIAGNOSIS — F17210 Nicotine dependence, cigarettes, uncomplicated: Secondary | ICD-10-CM | POA: Insufficient documentation

## 2022-10-04 DIAGNOSIS — Z170 Estrogen receptor positive status [ER+]: Secondary | ICD-10-CM | POA: Diagnosis not present

## 2022-10-04 DIAGNOSIS — C50511 Malignant neoplasm of lower-outer quadrant of right female breast: Secondary | ICD-10-CM

## 2022-10-04 DIAGNOSIS — Z7901 Long term (current) use of anticoagulants: Secondary | ICD-10-CM | POA: Insufficient documentation

## 2022-10-04 DIAGNOSIS — Z808 Family history of malignant neoplasm of other organs or systems: Secondary | ICD-10-CM | POA: Diagnosis not present

## 2022-10-04 DIAGNOSIS — Z803 Family history of malignant neoplasm of breast: Secondary | ICD-10-CM | POA: Insufficient documentation

## 2022-10-04 LAB — CBC WITH DIFFERENTIAL (CANCER CENTER ONLY)
Abs Immature Granulocytes: 0.01 10*3/uL (ref 0.00–0.07)
Basophils Absolute: 0.1 10*3/uL (ref 0.0–0.1)
Basophils Relative: 1 %
Eosinophils Absolute: 0.2 10*3/uL (ref 0.0–0.5)
Eosinophils Relative: 4 %
HCT: 37.3 % (ref 36.0–46.0)
Hemoglobin: 12.5 g/dL (ref 12.0–15.0)
Immature Granulocytes: 0 %
Lymphocytes Relative: 35 %
Lymphs Abs: 2 10*3/uL (ref 0.7–4.0)
MCH: 29.8 pg (ref 26.0–34.0)
MCHC: 33.5 g/dL (ref 30.0–36.0)
MCV: 88.8 fL (ref 80.0–100.0)
Monocytes Absolute: 0.4 10*3/uL (ref 0.1–1.0)
Monocytes Relative: 6 %
Neutro Abs: 3.1 10*3/uL (ref 1.7–7.7)
Neutrophils Relative %: 54 %
Platelet Count: 154 10*3/uL (ref 150–400)
RBC: 4.2 MIL/uL (ref 3.87–5.11)
RDW: 14.6 % (ref 11.5–15.5)
WBC Count: 5.7 10*3/uL (ref 4.0–10.5)
nRBC: 0 % (ref 0.0–0.2)

## 2022-10-04 LAB — CMP (CANCER CENTER ONLY)
ALT: 10 U/L (ref 0–44)
AST: 12 U/L — ABNORMAL LOW (ref 15–41)
Albumin: 3.9 g/dL (ref 3.5–5.0)
Alkaline Phosphatase: 71 U/L (ref 38–126)
Anion gap: 3 — ABNORMAL LOW (ref 5–15)
BUN: 24 mg/dL — ABNORMAL HIGH (ref 8–23)
CO2: 33 mmol/L — ABNORMAL HIGH (ref 22–32)
Calcium: 9.1 mg/dL (ref 8.9–10.3)
Chloride: 102 mmol/L (ref 98–111)
Creatinine: 0.97 mg/dL (ref 0.44–1.00)
GFR, Estimated: >60 mL/min (ref >60)
Glucose, Bld: 176 mg/dL — ABNORMAL HIGH (ref 70–99)
Potassium: 4.3 mmol/L (ref 3.5–5.1)
Sodium: 138 mmol/L (ref 135–145)
Total Bilirubin: 0.5 mg/dL (ref 0.3–1.2)
Total Protein: 7.1 g/dL (ref 6.5–8.1)

## 2022-10-04 NOTE — Progress Notes (Signed)
[facility]  [phone] [phone]) [phone]     ID: Caroline [name] DOB: April 06, 1956  MR#: [id number]  DZH#:[id number]  Patient Care Team: Selinda Orion as PCP - General (Physician Assistant) Magrinat, Virgie Dad, MD (Inactive) as Consulting Physician (Oncology) Erroll Luna, MD as Consulting Physician (General Surgery) Haygood, Seymour Bars, MD (Inactive) as Consulting Physician (Obstetrics and Gynecology) Donzetta Sprung., MD as Physician Assistant (Sports Medicine) Eppie Gibson, MD as Attending Physician (Radiation Oncology) Juanita Craver, MD as Consulting Physician (Gastroenterology) Thompson Grayer, MD as Consulting Physician (Cardiology) Mcarthur Rossetti, MD as Consulting Physician (Orthopedic Surgery) Benay Pike, MD OTHER MD:  CHIEF COMPLAINT: Estrogen receptor positive breast cancer  CURRENT TREATMENT: tamoxifen  INTERVAL HISTORY: Caroline [name] is here for follow-up on tamoxifen.  She has been tolerating tamoxifen very well.  She denies any new health complaints.  She is wondering if she can stop tamoxifen since she is done with 5 years of antiestrogen therapy or if there is any role to continue it further. Her most recent mammogram done in first week of October was unremarkable. Rest of the pertinent 10 point ROS reviewed and negative  COVID 19 VACCINATION STATUS: Status post [place] x2 with booster in August, possible infection December 2021   HISTORY OF CURRENT ILLNESS: From the original intake note:  Caroline [name] (last name is pronounced muh-[id number]) had routine screening mammography with tomography at the [facility] 07/21/2017, showing the breast density to be category C. An area of possible distortion was noted in the right breast, and on 07/28/2017 she underwent right diagnostic mammography with tomography and right breast ultrasonography. This confirmed an area of distortion in the outer right breast which by exam showed acid focal  thickening at the 8:30 position, 4 cm from the nipple. Ultrasound showed 3 separate irregular hypoechoic areas in this region, measuring 0.9, 0.7 and 0.5 cm. Taken together these 2 areas at up to 2.1 cm in greatest diameter. There was no abnormal right axillary adenopathy.  2 of these masses were biopsied 08/02/2017. The final pathology (SAA [id number]) showed both to consist of invasive ductal carcinoma, grade 2 with the prognostic panel (from the lesion at 8:30 o'clock 4 cm from the nipple) was estrogen receptor 90% positive, progesterone receptor 20% positive, both with strong staining intensity, with an MIB-1 of 15%, and no HER-2 complication, the signals ratio being 1.00 and the number per cell 1.05.  The patient proceeded to right lumpectomy and axillary sentinel lymph node sampling 08/17/2017, with the final pathology ([id number]) showing 2 areas of invasive ductal carcinoma, grade 2, measuring 1.5 and 0.7 cm. Margins were broadly positive medially and focally laterally. The single sentinel lymph node was involved by tumor.  The patient's subsequent history is as detailed below.   PAST MEDICAL HISTORY: Past Medical History:  Diagnosis Date   Arthritis    hips   Breast cancer ([place])    Cancer ([place])    Left breast   Family history of breast cancer    Family history of lung cancer    Family history of melanoma    History of kidney stones    Hypertension    Paroxysmal atrial fibrillation ([place])    Personal history of radiation therapy    S/P radiation therapy 10/10/17- 11/22/17   Right Breast/ 50 gy in 25 fractions, Right Breast boost/ 10 Gy in 5 fractions.    PAST SURGICAL HISTORY: Past Surgical History:  Procedure Laterality Date   BREAST BIOPSY Right 08/02/2017  BREAST LUMPECTOMY Right 08/17/2017   BREAST LUMPECTOMY WITH RADIOACTIVE SEED AND SENTINEL LYMPH NODE BIOPSY Right 08/17/2017   Procedure: RIGHT BREAST RADIOACTIVE SEED LOCALIZED LUMPECTOMY AND SENTINEL LYMPH NODE BIOPSY;   Surgeon: Erroll Luna, MD;  Location: [place] [place];  Service: General;  Laterality: Right;   CESAREAN SECTION  [phone]   COLONOSCOPY     HEMORRHOID SURGERY     20 years ago   RE-EXCISION OF BREAST LUMPECTOMY Right 09/06/2017   Procedure: RE-EXCISION OF RIGHT BREAST LUMPECTOMY;  Surgeon: Erroll Luna, MD;  Location: [facility];  Service: General;  Laterality: Right;   TOE SURGERY Right    5th 20 years ago   TOTAL HIP ARTHROPLASTY Left 12/01/2017   Procedure: LEFT TOTAL HIP ARTHROPLASTY ANTERIOR APPROACH;  Surgeon: Mcarthur Rossetti, MD;  Location: [facility];  Service: Orthopedics;  Laterality: Left;    FAMILY HISTORY Family History  Problem Relation Age of Onset   Breast cancer Cousin        dx. in her 10s (paternal first cousin)   Emphysema Mother    Dementia Father    Melanoma Brother        dx. in his 51s or 16s   Lung cancer Paternal Aunt        dx. late 21s, smoker  The patient's father died from dementia at the age of 28. The patient's mother died from emphysema at the age of 64. The patient has 2 brothers, no sisters. On the paternal side a first cousin was diagnosed with breast cancer at the age of 7. There is no other breast ovarian or prostate cancer in the family but the patient is aware it is a small family. The patient's brother living in [place] had a melanoma diagnosed in his 64s   GYNECOLOGIC HISTORY:  Patient's last menstrual period was 11/25/2010. Menarche age 6, first live birth age 7, she is [place] P3. The middle child was stillborn. She stopped having periods in her late 28s. She did not take hormone replacement. She took oral contraceptives for approximately 15 years remotely with no complications.   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  She retired in 2021.  Her husband Caroline [name] is self-employed in Sales promotion account executive. Daughter Caroline [name] graduated from [employer] in exercise and sports signs.  She married May 2021.  Son Caroline [name] is currently working with his father.  He is 22 as of February 2021 and lives at home.  The patient is not a church attender.    ADVANCED DIRECTIVES: In the absence of any documents to the contrary the patient's husband is her healthcare power of attorney   HEALTH MAINTENANCE: Social History   Tobacco Use   Smoking status: Every Day    Packs/day: 0.50    Years: 40.00    Total pack years: 20.00    Types: Cigarettes   Smokeless tobacco: Never  Vaping Use   Vaping Use: Never used  Substance Use Topics   Alcohol use: Yes    Alcohol/week: 7.0 standard drinks of alcohol    Types: 7 Glasses of wine per week    Comment: wine nightly   Drug use: No     Colonoscopy:/ [facility]  PAP:  Bone density: remote   Allergies  Allergen Reactions   Sulfa Antibiotics Other (See Comments)    Unknown, was child    Current Outpatient Medications  Medication Sig Dispense Refill  gabapentin (NEURONTIN) 300 MG capsule Take 1 capsule (300 mg total) by mouth at bedtime. 90 capsule 4   metoprolol succinate (TOPROL-XL) 25 MG 24 hr tablet Take 1 tablet (25 mg total) by mouth in the morning and at bedtime. Future refills from pcp 60 tablet 0   oxyCODONE-acetaminophen (PERCOCET) 7.5-325 MG tablet Take 1 tablet by mouth every 4 (four) hours as needed for severe pain. 30 tablet 0   tamoxifen (NOLVADEX) 20 MG tablet Take 1 tablet (20 mg total) by mouth daily. 90 tablet 3   valsartan-hydrochlorothiazide (DIOVAN-HCT) 160-12.5 MG tablet Take 1 tablet by mouth daily.     No current facility-administered medications for this visit.    OBJECTIVE: White woman who appears stated age  66:   10/04/22 1338  BP: 109/71  Pulse: 88  Resp: 16  Temp: 98.1 F (36.7 C)  SpO2: 95%      Body mass index is 33.18 kg/m.   Wt Readings from Last 3 Encounters:  10/04/22 193 lb 4.8 oz (87.7 kg)  08/31/21 187 lb 3.2 oz (84.9 kg)  02/04/20 184 lb 11.2 oz  (83.8 kg)      ECOG FS:1 - Symptomatic but completely ambulatory   Physical Exam Constitutional:      Appearance: Normal appearance.  Chest:     Comments: Bilateral breasts inspected and palpated.  Right breast status postlumpectomy and radiation, smaller than left breast.  No palpable masses.  No regional adenopathy. Musculoskeletal:     Cervical back: Normal range of motion and neck supple.  Neurological:     Mental Status: She is alert.       LAB RESULTS:   CMP     Component Value Date/Time   NA 140 08/31/2021 1308   NA 140 08/31/2017 1616   K 4.7 08/31/2021 1308   K 3.9 08/31/2017 1616   CL 102 08/31/2021 1308   CO2 25 08/31/2021 1308   CO2 27 08/31/2017 1616   GLUCOSE 126 (H) 08/31/2021 1308   GLUCOSE 139 08/31/2017 1616   BUN 40 (H) 08/31/2021 1308   BUN 45.6 (H) 08/31/2017 1616   CREATININE 1.31 (H) 08/31/2021 1308   CREATININE 1.6 (H) 08/31/2017 1616   CALCIUM 9.7 08/31/2021 1308   CALCIUM 10.0 08/31/2017 1616   PROT 7.1 08/31/2021 1308   PROT 7.2 08/31/2017 1616   ALBUMIN 3.8 08/31/2021 1308   ALBUMIN 3.5 08/31/2017 1616   AST 13 (L) 08/31/2021 1308   AST 12 08/31/2017 1616   ALT 15 08/31/2021 1308   ALT 14 08/31/2017 1616   ALKPHOS 60 08/31/2021 1308   ALKPHOS 76 08/31/2017 1616   BILITOT 0.5 08/31/2021 1308   BILITOT 0.25 08/31/2017 1616   GFRNONAA 46 (L) 08/31/2021 1308   GFRAA >60 02/04/2020 1347   GFRAA 57 (L) 01/10/2018 1307    No results found for: "TOTALPROTELP", "ALBUMINELP", "A1GS", "A2GS", "BETS", "BETA2SER", "GAMS", "MSPIKE", "SPEI"  No results found for: "KPAFRELGTCHN", "LAMBDASER", "KAPLAMBRATIO"  Lab Results  Component Value Date   WBC 6.3 08/31/2021   NEUTROABS 3.7 08/31/2021   HGB 13.0 08/31/2021   HCT 37.7 08/31/2021   MCV 93.5 08/31/2021   PLT 164 08/31/2021      Chemistry      Component Value Date/Time   NA 140 08/31/2021 1308   NA 140 08/31/2017 1616   K 4.7 08/31/2021 1308   K 3.9 08/31/2017 1616   CL 102  08/31/2021 1308   CO2 25 08/31/2021 1308   CO2 27 08/31/2017 1616  BUN 40 (H) 08/31/2021 1308   BUN 45.6 (H) 08/31/2017 1616   CREATININE 1.31 (H) 08/31/2021 1308   CREATININE 1.6 (H) 08/31/2017 1616      Component Value Date/Time   CALCIUM 9.7 08/31/2021 1308   CALCIUM 10.0 08/31/2017 1616   ALKPHOS 60 08/31/2021 1308   ALKPHOS 76 08/31/2017 1616   AST 13 (L) 08/31/2021 1308   AST 12 08/31/2017 1616   ALT 15 08/31/2021 1308   ALT 14 08/31/2017 1616   BILITOT 0.5 08/31/2021 1308   BILITOT 0.25 08/31/2017 1616       No results found for: "LABCA2"  No components found for: "DHRCBU384"  No results for input(s): "INR" in the last 168 hours.  No results found for: "LABCA2"  No results found for: "TXM468"  No results found for: "CAN125"  No results found for: "CAN153"  No results found for: "CA2729"  No components found for: "HGQUANT"  No results found for: "CEA1", "CEA" / No results found for: "CEA1", "CEA"   No results found for: "AFPTUMOR"  No results found for: "CHROMOGRNA"  No results found for: "HGBA", "HGBA2QUANT", "HGBFQUANT", "HGBSQUAN" (Hemoglobinopathy evaluation)   No results found for: "LDH"  No results found for: "IRON", "TIBC", "IRONPCTSAT" (Iron and TIBC)  No results found for: "FERRITIN"  Urinalysis No results found for: "COLORURINE", "APPEARANCEUR", "LABSPEC", "PHURINE", "GLUCOSEU", "HGBUR", "BILIRUBINUR", "KETONESUR", "PROTEINUR", "UROBILINOGEN", "NITRITE", "LEUKOCYTESUR"   STUDIES: MM 3D SCREEN BREAST BILATERAL  Result Date: 10/03/2022 CLINICAL DATA:  Screening. EXAM: DIGITAL SCREENING BILATERAL MAMMOGRAM WITH TOMOSYNTHESIS AND CAD TECHNIQUE: Bilateral screening digital craniocaudal and mediolateral oblique mammograms were obtained. Bilateral screening digital breast tomosynthesis was performed. The images were evaluated with computer-aided detection. COMPARISON:  Previous exam(s). ACR Breast Density Category c: The breast tissue is  heterogeneously dense, which may obscure small masses. FINDINGS: There are no findings suspicious for malignancy. IMPRESSION: No mammographic evidence of malignancy. A result letter of this screening mammogram will be mailed directly to the patient. RECOMMENDATION: Screening mammogram in one year. (Code:[id number]-B-01Y) BI-RADS CATEGORY  1: Negative. Electronically Signed   By: Dorise Bullion III M.D.   On: 10/03/2022 14:59      ELIGIBLE FOR AVAILABLE RESEARCH PROTOCOL: no  ASSESSMENT: 66 y.o. [place], [place] woman status post right breast upper outer quadrant biopsy 08/02/2017 for a clinical T1c No invasive ductal carcinoma, grade 2, estrogen and progesterone receptor positive, HER-2 not amplified, with an MIB-1 of 15%.  (1) status post right lumpectomy and axillary sentinel lymph node sampling 08/17/2017 for an mpT1c pN1, stage IB invasive ductal carcinoma, grade 2, with positive margins  (2) additional surgery 09/06/2017 successfully cleared margins.  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 The variant allele frequency (VAF) for the c.542G>C variant was 18-28%, while the VAF for the c.818G>A variant was 10-20  (a) no additional deleterious mutations were found in APC, ATM, AXIN2, BARD1, BMPR1A, BRCA1, BRCA2, BRIP1, CDH1, CDK4, CDKN2A (p14ARF), CDKN2A (p16INK4a), CHEK2, CTNNA1, DICER1, EPCAM (Deletion/duplication testing only), GREM1 (promoter region deletion/duplication testing  only), KIT, MEN1, MLH1, MSH2, MSH3,  MSH6, MUTYH, NBN, NF1, NTHL1, PALB2, PDGFRA, PMS2, POLD1, POLE, PTEN, RAD50, RAD51C, RAD51D, RNF43, SDHB, SDHC, SDHD, SMAD4, SMARCA4. STK11, TP53, TSC1, TSC2, and VHL.  The following genes were evaluated for sequence changes only: SDHA and HOXB13 c.251G>A variant only  (7) paroxysmal atrial fibrillation: on lifelong anticoagulation (apixaban)   PLAN: Patient has now completed 5 years of antiestrogen therapy.  She tolerates tamoxifen really well.  We have discussed today about breast cancer index to see if she will benefit from extended amount of antiestrogen therapy.  She already refilled her 36-monthsupply and she is willing to continue it for at least 3 months.  In the interim hopefully for breast cancer index suggests additional benefit from extended antiestrogen therapy, she is willing to continue it.  She will otherwise continue annual mammograms.  Self breast exam recommended monthly.  Encouraged regular activity.  Return to clinic in 1 year or sooner as needed.  Telephone visit in about 2 months.  Total time spent: 30 minutes including history, physical exam, counseling and coordination of care.  *Total Encounter Time as defined by the Centers for Medicare and Medicaid Services includes, in addition to the face-to-face time of a patient visit (documented in the note above) non-face-to-face time: obtaining and reviewing outside history, ordering and reviewing medications, tests or procedures, care coordination (communications with other health care professionals or caregivers) and documentation in the medical record.

## 2022-10-05 ENCOUNTER — Telehealth: Payer: Self-pay | Admitting: Genetic Counselor

## 2022-10-05 NOTE — Telephone Encounter (Signed)
Set up genetics appt 10/16 at 10am to discuss mosaic TP53 result from 2021.

## 2022-10-10 ENCOUNTER — Inpatient Hospital Stay: Payer: BC Managed Care – PPO | Admitting: Genetic Counselor

## 2022-10-18 ENCOUNTER — Encounter: Payer: Self-pay | Admitting: Hematology and Oncology

## 2022-10-27 ENCOUNTER — Encounter: Payer: Self-pay | Admitting: *Deleted

## 2022-10-31 ENCOUNTER — Encounter (HOSPITAL_COMMUNITY): Payer: Self-pay

## 2022-12-01 ENCOUNTER — Telehealth: Payer: Self-pay

## 2022-12-01 NOTE — Telephone Encounter (Signed)
Returned patient's call regarding appointment on 12/11. Patient unavailable. Left message requesting patient to return call.

## 2022-12-01 NOTE — Telephone Encounter (Signed)
Patient returned call regarding appointment scheduled for 12/11. Patient is unable to keep current appointment and was not aware of the need for the follow-up appointment. Patient reports that she does not have any new concerns and has stopped the tamoxifen, as she has been on it for 5 years.  Telephone visit for 12/11 canceled. RN will reach out to Dr. Chryl Heck to determine, if patient needs follow-up visit.  Patient agreeable to visit and knows that schedulers will contact her should there be a need for more immediate follow-up.

## 2022-12-05 ENCOUNTER — Inpatient Hospital Stay: Payer: BC Managed Care – PPO | Admitting: Hematology and Oncology

## 2022-12-15 ENCOUNTER — Inpatient Hospital Stay: Payer: BC Managed Care – PPO | Admitting: Genetic Counselor

## 2022-12-15 ENCOUNTER — Telehealth: Payer: Self-pay | Admitting: Genetic Counselor

## 2022-12-15 ENCOUNTER — Inpatient Hospital Stay: Payer: BC Managed Care – PPO

## 2022-12-15 ENCOUNTER — Encounter: Payer: Self-pay | Admitting: Genetic Counselor

## 2022-12-15 NOTE — Telephone Encounter (Signed)
Patient called to r/s due to testing positive for covid. Patient notified of new appointment time/date

## 2022-12-15 NOTE — Telephone Encounter (Signed)
Contacted [facility] about no charge testing of TP53 variants for her children.  No charge family variant testing approved for her children.  Ordering information instructions included below  ---    Thank you for your interest in additional testing to further clarify the nature of the TP53 variant for [id number] (J.M., [birth year]).  For this case, we have approved the following testing at no additional charge: TP53 testing for the patient's relatives (any two). Here are instructions to assist you when placing the order:    If you are ordering online through the Portal: Choose "Family Follow-Up Testing", fill in the RQ number of the proband when prompted, and select the TP53 gene. Enter the [employer] partner code: [id number] (TP53 Study) Fill in all other sections as prompted. The Portal should recognize the partner code and have "Sponsored Testing" as the billing selection (in the order summary, it will be marked as Institutional, but [facility] is the institution in this case). Or for paper requisitions: Under [employer], write in [place] Partner Code [id number] For Test Selection, complete option 3 Family Follow-Up Testing section.  Please note, targeted variant testing includes full gene sequencing and deletion/duplication testing. The presence or absence of the familial variant is always reported. Pathogenic, Likely Pathogenic, and benign pseudodeficiency variants elsewhere in the gene, if present, would also be reported. Non-familial Variants of Uncertain Significance elsewhere in the gene, if present, would not be reported.    Please let us know if you have any additional questions about the program.   Best regards, Shawnie Pons, MS, [facility] Licensed, Certified Genetic Counselor    [place] [email or web address] .Charissa Bash: [phone] F: [phone] [email or web address]

## 2023-01-17 ENCOUNTER — Inpatient Hospital Stay: Payer: Self-pay | Admitting: Genetic Counselor

## 2023-01-17 ENCOUNTER — Inpatient Hospital Stay: Payer: Self-pay

## 2023-01-26 ENCOUNTER — Telehealth: Payer: Self-pay | Admitting: Genetic Counselor

## 2023-01-26 NOTE — Telephone Encounter (Signed)
This patient has cancelled appointments due to being sick.  I called to see if she wanted to r/s.  LM on VM with my phone number asking her to call if she would like to r/s her appointment.

## 2023-02-02 ENCOUNTER — Encounter (HOSPITAL_COMMUNITY): Payer: Self-pay | Admitting: *Deleted

## 2023-05-17 ENCOUNTER — Encounter (HOSPITAL_COMMUNITY): Payer: Self-pay

## 2023-05-18 ENCOUNTER — Encounter (HOSPITAL_COMMUNITY): Payer: Self-pay | Admitting: Internal Medicine

## 2023-05-18 ENCOUNTER — Inpatient Hospital Stay (HOSPITAL_COMMUNITY)
Admission: AD | Admit: 2023-05-18 | Discharge: 2023-05-20 | DRG: 835 | Disposition: A | Discharge status: Other Acute Inpatient Hospital | Payer: Medicare Other | Attending: Internal Medicine | Admitting: Internal Medicine

## 2023-05-18 ENCOUNTER — Other Ambulatory Visit: Payer: Self-pay

## 2023-05-18 DIAGNOSIS — D61818 Other pancytopenia: Secondary | ICD-10-CM | POA: Diagnosis present

## 2023-05-18 DIAGNOSIS — Z923 Personal history of irradiation: Secondary | ICD-10-CM

## 2023-05-18 DIAGNOSIS — R5383 Other fatigue: Secondary | ICD-10-CM | POA: Diagnosis present

## 2023-05-18 DIAGNOSIS — Z79899 Other long term (current) drug therapy: Secondary | ICD-10-CM

## 2023-05-18 DIAGNOSIS — Z801 Family history of malignant neoplasm of trachea, bronchus and lung: Secondary | ICD-10-CM

## 2023-05-18 DIAGNOSIS — Z87442 Personal history of urinary calculi: Secondary | ICD-10-CM

## 2023-05-18 DIAGNOSIS — I10 Essential (primary) hypertension: Secondary | ICD-10-CM | POA: Diagnosis present

## 2023-05-18 DIAGNOSIS — M1990 Unspecified osteoarthritis, unspecified site: Secondary | ICD-10-CM | POA: Diagnosis present

## 2023-05-18 DIAGNOSIS — F17210 Nicotine dependence, cigarettes, uncomplicated: Secondary | ICD-10-CM | POA: Diagnosis present

## 2023-05-18 DIAGNOSIS — Z803 Family history of malignant neoplasm of breast: Secondary | ICD-10-CM

## 2023-05-18 DIAGNOSIS — I480 Paroxysmal atrial fibrillation: Secondary | ICD-10-CM | POA: Diagnosis present

## 2023-05-18 DIAGNOSIS — F1091 Alcohol use, unspecified, in remission: Secondary | ICD-10-CM | POA: Diagnosis present

## 2023-05-18 DIAGNOSIS — F32A Depression, unspecified: Secondary | ICD-10-CM | POA: Diagnosis present

## 2023-05-18 DIAGNOSIS — Z853 Personal history of malignant neoplasm of breast: Secondary | ICD-10-CM

## 2023-05-18 DIAGNOSIS — Z808 Family history of malignant neoplasm of other organs or systems: Secondary | ICD-10-CM

## 2023-05-18 DIAGNOSIS — Z96642 Presence of left artificial hip joint: Secondary | ICD-10-CM | POA: Diagnosis present

## 2023-05-18 DIAGNOSIS — Z882 Allergy status to sulfonamides status: Secondary | ICD-10-CM

## 2023-05-18 DIAGNOSIS — Z825 Family history of asthma and other chronic lower respiratory diseases: Secondary | ICD-10-CM

## 2023-05-18 DIAGNOSIS — E785 Hyperlipidemia, unspecified: Secondary | ICD-10-CM | POA: Diagnosis present

## 2023-05-18 DIAGNOSIS — I2510 Atherosclerotic heart disease of native coronary artery without angina pectoris: Secondary | ICD-10-CM | POA: Diagnosis present

## 2023-05-18 DIAGNOSIS — Z170 Estrogen receptor positive status [ER+]: Secondary | ICD-10-CM

## 2023-05-18 DIAGNOSIS — C9200 Acute myeloblastic leukemia, not having achieved remission: Principal | ICD-10-CM | POA: Diagnosis present

## 2023-05-18 LAB — RETICULOCYTES
Immature Retic Fract: 18.3 % — ABNORMAL HIGH (ref 2.3–15.9)
RBC.: 2.2 MIL/uL — ABNORMAL LOW (ref 3.87–5.11)
Retic Count, Absolute: 24.9 10*3/uL (ref 19.0–186.0)
Retic Ct Pct: 1.1 % (ref 0.4–3.1)

## 2023-05-18 LAB — CBC WITH DIFFERENTIAL/PLATELET
Abs Immature Granulocytes: 0.01 10*3/uL (ref 0.00–0.07)
Basophils Absolute: 0 10*3/uL (ref 0.0–0.1)
Basophils Relative: 1 %
Eosinophils Absolute: 0 10*3/uL (ref 0.0–0.5)
Eosinophils Relative: 1 %
HCT: 20.5 % — ABNORMAL LOW (ref 36.0–46.0)
Hemoglobin: 6.8 g/dL — CL (ref 12.0–15.0)
Immature Granulocytes: 1 %
Lymphocytes Relative: 66 %
Lymphs Abs: 0.8 10*3/uL (ref 0.7–4.0)
MCH: 30.8 pg (ref 26.0–34.0)
MCHC: 33.2 g/dL (ref 30.0–36.0)
MCV: 92.8 fL (ref 80.0–100.0)
Monocytes Absolute: 0.1 10*3/uL (ref 0.1–1.0)
Monocytes Relative: 9 %
Neutro Abs: 0.3 10*3/uL — CL (ref 1.7–7.7)
Neutrophils Relative %: 22 %
Platelets: 20 10*3/uL — CL (ref 150–400)
RBC: 2.21 MIL/uL — ABNORMAL LOW (ref 3.87–5.11)
RDW: 18.1 % — ABNORMAL HIGH (ref 11.5–15.5)
WBC: 1.2 10*3/uL — CL (ref 4.0–10.5)
nRBC: 3.3 % — ABNORMAL HIGH (ref 0.0–0.2)

## 2023-05-18 LAB — FERRITIN: Ferritin: 239 ng/mL (ref 11–307)

## 2023-05-18 LAB — COMPREHENSIVE METABOLIC PANEL
ALT: 14 U/L (ref 0–44)
AST: 12 U/L — ABNORMAL LOW (ref 15–41)
Albumin: 3.2 g/dL — ABNORMAL LOW (ref 3.5–5.0)
Alkaline Phosphatase: 59 U/L (ref 38–126)
Anion gap: 7 (ref 5–15)
BUN: 18 mg/dL (ref 8–23)
CO2: 23 mmol/L (ref 22–32)
Calcium: 8.8 mg/dL — ABNORMAL LOW (ref 8.9–10.3)
Chloride: 105 mmol/L (ref 98–111)
Creatinine, Ser: 0.95 mg/dL (ref 0.44–1.00)
GFR, Estimated: >60 mL/min (ref >60)
Glucose, Bld: 96 mg/dL (ref 70–99)
Potassium: 4.5 mmol/L (ref 3.5–5.1)
Sodium: 135 mmol/L (ref 135–145)
Total Bilirubin: 1.3 mg/dL — ABNORMAL HIGH (ref 0.3–1.2)
Total Protein: 6.7 g/dL (ref 6.5–8.1)

## 2023-05-18 LAB — IRON AND TIBC
Iron: 145 ug/dL (ref 28–170)
Saturation Ratios: 39 % — ABNORMAL HIGH (ref 10.4–31.8)
TIBC: 370 ug/dL (ref 250–450)
UIBC: 225 ug/dL

## 2023-05-18 LAB — BPAM RBC
Blood Product Expiration Date: 202406282359
Unit Type and Rh: 5100

## 2023-05-18 LAB — PREPARE RBC (CROSSMATCH)

## 2023-05-18 LAB — VITAMIN B12: Vitamin B-12: 2044 pg/mL — ABNORMAL HIGH (ref 180–914)

## 2023-05-18 LAB — TYPE AND SCREEN
ABO/RH(D): O POS
Unit division: 0

## 2023-05-18 LAB — FOLATE: Folate: 13.2 ng/mL (ref >5.9)

## 2023-05-18 LAB — TSH: TSH: 1.261 u[IU]/mL (ref 0.350–4.500)

## 2023-05-18 LAB — MAGNESIUM: Magnesium: 1.5 mg/dL — ABNORMAL LOW (ref 1.7–2.4)

## 2023-05-18 MED ORDER — ROSUVASTATIN CALCIUM 5 MG PO TABS
5.0000 mg | ORAL_TABLET | Freq: Every day | ORAL | Status: DC
  Administered 2023-05-18 – 2023-05-19 (×2): 5 mg via ORAL
  Filled 2023-05-18 (×2): qty 1

## 2023-05-18 MED ORDER — MAGNESIUM SULFATE 2 GM/50ML IV SOLN
2.0000 g | Freq: Once | INTRAVENOUS | Status: AC
  Administered 2023-05-18: 2 g via INTRAVENOUS
  Filled 2023-05-18: qty 50

## 2023-05-18 MED ORDER — OXYCODONE-ACETAMINOPHEN 5-325 MG PO TABS
1.0000 | ORAL_TABLET | Freq: Four times a day (QID) | ORAL | Status: DC
  Administered 2023-05-18 – 2023-05-20 (×7): 1 via ORAL
  Filled 2023-05-18 (×8): qty 1

## 2023-05-18 MED ORDER — SODIUM CHLORIDE 0.9% IV SOLUTION: Freq: Once | INTRAVENOUS | Status: AC

## 2023-05-18 MED ORDER — OXYCODONE-ACETAMINOPHEN 10-325 MG PO TABS: 1.0000 | ORAL_TABLET | Freq: Four times a day (QID) | ORAL | Status: DC

## 2023-05-18 MED ORDER — OXYCODONE HCL 5 MG PO TABS
5.0000 mg | ORAL_TABLET | Freq: Four times a day (QID) | ORAL | Status: DC
  Administered 2023-05-18 – 2023-05-20 (×7): 5 mg via ORAL
  Filled 2023-05-18 (×8): qty 1

## 2023-05-18 NOTE — Plan of Care (Signed)

## 2023-05-18 NOTE — H&P (Signed)
History and Physical    Caroline [name] ZOX:[id number] DOB: [birth year] DOA: 05/18/2023  I have briefly reviewed the patient's prior medical records in [facility]  PCP: Teena Irani, PA-C  Patient coming from: home  Chief Complaint: fatigue  HPI: Caroline [name] is a 67 y.o. female with medical history significant of breast cancer several years back s/p surgery, radiation therapy, and finished 5 years of tamoxifen, used to see Dr. Darnelle Catalan but now switched to Dr. Al Pimple, tobacco use, hypertension, hyperlipidemia, prior heavy EtOH use up until about a year and a half ago, comes to the hospital with complaints of fatigue.  She has been feeling weaker than normal for the past couple of weeks.  She saw her PCP, underwent basic workup, was found to have pancytopenia and she was directed to the emergency room.  She denies any fever or chills but does complain of feeling like she is running a low-grade temp several times in the last few weeks.  There is no abdominal pain, no nausea or vomiting.  There is no weight loss.  There is no diarrhea, she has not seen any blood in her stools.  ED Course: Workup in the ED showed PT 14.7, INR 1.1, fibrinogen normal at 467.  Peripheral smear showed marked normocytic, normochromic anemia, thrombocytopenia and leukopenia with absolute neutropenia.  Rare atypical immature appearing cells suspicious for blasts are identified. White count was 1.2 hemoglobin 6.7, platelets were 27.  MCV was 94.7.  Reticulocytes were 1.38, immature reticulocytes were elevated at 19.1.  ANC was 0.25. She underwent a CT chest abdomen pelvis with IV contrast which showed hepatosplenomegaly, nonspecific perinephric stranding of the kidneys without hydronephrosis.  Chest was fairly unremarkable except for coronary artery disease.  There was no PE identified.  Musculoskeletal did not show any acute osseous findings  Review of Systems: All systems reviewed, and apart from HPI, all  negative  Past Medical History:  Diagnosis Date   Arthritis    hips   Breast cancer (HCC)    Cancer (HCC)    Left breast   Family history of breast cancer    Family history of lung cancer    Family history of melanoma    History of kidney stones    Hypertension    Paroxysmal atrial fibrillation (HCC)    Personal history of radiation therapy    S/P radiation therapy 10/10/17- 11/22/17   Right Breast/ 50 gy in 25 fractions, Right Breast boost/ 10 Gy in 5 fractions.    Past Surgical History:  Procedure Laterality Date   BREAST BIOPSY Right 08/02/2017   BREAST LUMPECTOMY Right 08/17/2017   BREAST LUMPECTOMY WITH RADIOACTIVE SEED AND SENTINEL LYMPH NODE BIOPSY Right 08/17/2017   Procedure: RIGHT BREAST RADIOACTIVE SEED LOCALIZED LUMPECTOMY AND SENTINEL LYMPH NODE BIOPSY;  Surgeon: Harriette Bouillon, MD;  Location: [facility];  Service: General;  Laterality: Right;   CESAREAN SECTION  [phone]   COLONOSCOPY     HEMORRHOID SURGERY     20 years ago   RE-EXCISION OF BREAST LUMPECTOMY Right 09/06/2017   Procedure: RE-EXCISION OF RIGHT BREAST LUMPECTOMY;  Surgeon: Harriette Bouillon, MD;  Location: [facility];  Service: General;  Laterality: Right;   TOE SURGERY Right    5th 20 years ago   TOTAL HIP ARTHROPLASTY Left 12/01/2017   Procedure: LEFT TOTAL HIP ARTHROPLASTY ANTERIOR APPROACH;  Surgeon: Kathryne Hitch, MD;  Location: [facility];  Service: Orthopedics;  Laterality: Left;  reports that she has been smoking cigarettes. She has a 20.00 pack-year smoking history. She has never used smokeless tobacco. She reports current alcohol use of about 7.0 standard drinks of alcohol per week. She reports that she does not use drugs.  Allergies  Allergen Reactions   Sulfa Antibiotics Other (See Comments)    Unknown, was child    Family History  Problem Relation Age of Onset   Breast cancer Cousin        dx. in her 69s (paternal first cousin)    Emphysema Mother    Dementia Father    Melanoma Brother        dx. in his 71s or 80s   Lung cancer Paternal Aunt        dx. late 60s, smoker    Prior to Admission medications   Medication Sig Start Date End Date Taking? Authorizing Provider  DULoxetine (CYMBALTA) 20 MG capsule Take 40 mg by mouth daily.    [provider]  metoprolol succinate (TOPROL-XL) 25 MG 24 hr tablet Take 1 tablet (25 mg total) by mouth in the morning and at bedtime. Future refills from pcp 01/22/21   Newman Nip, NP  oxyCODONE-acetaminophen (PERCOCET) 7.5-325 MG tablet Take 1 tablet by mouth every 4 (four) hours as needed for severe pain. 08/31/21   Magrinat, Valentino Hue, MD  rosuvastatin (CRESTOR) 5 MG tablet Take 5 mg by mouth daily.    [provider]  tamoxifen (NOLVADEX) 20 MG tablet Take 1 tablet (20 mg total) by mouth daily. 02/28/22   Rachel Moulds, MD  valsartan-hydrochlorothiazide (DIOVAN-HCT) 160-12.5 MG tablet Take 1 tablet by mouth daily. 09/15/16   [provider]  Vitamin D, Ergocalciferol, (DRISDOL) 1.25 MG (50000 UNIT) CAPS capsule Take 50,000 Units by mouth every 7 (seven) days.    [provider]    Physical Exam: Vitals:   05/18/23 1503  BP: 105/80  Pulse: 88  Temp: 98.5 F (36.9 C)  TempSrc: Oral  SpO2: 95%   Constitutional: NAD, calm, comfortable Eyes: lids and conjunctivae normal ENMT: Mucous membranes are moist.  Neck: normal, supple Respiratory: clear to auscultation bilaterally, no wheezing, no crackles. Normal respiratory effort. No accessory muscle use.  Cardiovascular: Regular rate and rhythm, no murmurs / rubs / gallops. No extremity edema.  Abdomen: no tenderness, no masses palpated. Bowel sounds positive.  Musculoskeletal: no clubbing / cyanosis. Normal muscle tone.  Skin: no rashes, lesions, ulcers. No induration Neurologic: grossly non focal  Psychiatric: Normal judgment and insight. Alert and oriented x 3. Normal mood.   Labs on  Admission: I have personally reviewed following labs and imaging studies  CBC: No results for input(s): "WBC", "NEUTROABS", "HGB", "HCT", "MCV", "PLT" in the last 168 hours. Basic Metabolic Panel: No results for input(s): "NA", "K", "CL", "CO2", "GLUCOSE", "BUN", "CREATININE", "CALCIUM", "MG", "PHOS" in the last 168 hours. Liver Function Tests: No results for input(s): "AST", "ALT", "ALKPHOS", "BILITOT", "PROT", "ALBUMIN" in the last 168 hours. Coagulation Profile: No results for input(s): "INR", "PROTIME" in the last 168 hours. BNP (last 3 results) No results for input(s): "PROBNP" in the last 8760 hours. CBG: No results for input(s): "GLUCAP" in the last 168 hours. Thyroid Function Tests: No results for input(s): "TSH", "T4TOTAL", "FREET4", "T3FREE", "THYROIDAB" in the last 72 hours. Urine analysis: No results found for: "COLORURINE", "APPEARANCEUR", "LABSPEC", "PHURINE", "GLUCOSEU", "HGBUR", "BILIRUBINUR", "KETONESUR", "PROTEINUR", "UROBILINOGEN", "NITRITE", "LEUKOCYTESUR"   Radiological Exams on Admission: No results found.  Assessment/Plan Principal problem Pancytopenia -hematology consulted, discussed  with Dr. Al Pimple.  Main concerning feature along with her pancytopenia due to immature appearing cells on her peripheral smear, concerning for blasts.  Obtain basic workup this afternoon, monitor hemoglobin, CBC with differential, anemia panel and obtain a bone marrow biopsy.  Active problems Essential hypertension -currently she is normotensive, and I have seen couple of soft blood pressures on the flowsheet from the [facility] emergency room.  Hold home antihypertensives for now.  Hyperlipidemia -resume home medication once med rec done  Tobacco use -determined to quit after this hospitalization  Depression -resume Cymbalta once med rec done   DVT prophylaxis: SCDs  Code Status: Full  Family Communication: husband at bedside  Disposition Plan: home when ready  Bed Type:  Medsurg Consults called: Hematology  Obs/Inp: Obs  Pamella Pert, MD, PhD [facility]  Contact via [email or web address]  05/18/2023, 4:30 PM

## 2023-05-18 NOTE — Progress Notes (Addendum)
Patient is alert and oriented X4, went to PCP for ear pain and MD noted she was pale, admitted for a low hemoglobin of 5.0 at [facility] ED. Patient transferred to 1606 for work up. VSS, patient is ambulatory, and asymptomatic at this time. Admitting doc notifed through page.  1800- MD Gherghe Notifed Critical Lab values: WBC 1.2, H&H 6.8, Platelet 20, Absolute neutrophil count 0.3

## 2023-05-19 ENCOUNTER — Observation Stay (HOSPITAL_COMMUNITY): Payer: Medicare Other

## 2023-05-19 DIAGNOSIS — Z803 Family history of malignant neoplasm of breast: Secondary | ICD-10-CM | POA: Diagnosis not present

## 2023-05-19 DIAGNOSIS — R5383 Other fatigue: Secondary | ICD-10-CM | POA: Diagnosis present

## 2023-05-19 DIAGNOSIS — F1091 Alcohol use, unspecified, in remission: Secondary | ICD-10-CM | POA: Diagnosis present

## 2023-05-19 DIAGNOSIS — D61818 Other pancytopenia: Secondary | ICD-10-CM | POA: Diagnosis present

## 2023-05-19 DIAGNOSIS — Z882 Allergy status to sulfonamides status: Secondary | ICD-10-CM | POA: Diagnosis not present

## 2023-05-19 DIAGNOSIS — F17210 Nicotine dependence, cigarettes, uncomplicated: Secondary | ICD-10-CM | POA: Diagnosis present

## 2023-05-19 DIAGNOSIS — Z808 Family history of malignant neoplasm of other organs or systems: Secondary | ICD-10-CM | POA: Diagnosis not present

## 2023-05-19 DIAGNOSIS — I10 Essential (primary) hypertension: Secondary | ICD-10-CM | POA: Diagnosis present

## 2023-05-19 DIAGNOSIS — M1990 Unspecified osteoarthritis, unspecified site: Secondary | ICD-10-CM | POA: Diagnosis present

## 2023-05-19 DIAGNOSIS — Z96642 Presence of left artificial hip joint: Secondary | ICD-10-CM | POA: Diagnosis present

## 2023-05-19 DIAGNOSIS — I480 Paroxysmal atrial fibrillation: Secondary | ICD-10-CM | POA: Diagnosis present

## 2023-05-19 DIAGNOSIS — E785 Hyperlipidemia, unspecified: Secondary | ICD-10-CM | POA: Diagnosis present

## 2023-05-19 DIAGNOSIS — F32A Depression, unspecified: Secondary | ICD-10-CM | POA: Diagnosis present

## 2023-05-19 DIAGNOSIS — Z79899 Other long term (current) drug therapy: Secondary | ICD-10-CM | POA: Diagnosis not present

## 2023-05-19 DIAGNOSIS — Z87442 Personal history of urinary calculi: Secondary | ICD-10-CM | POA: Diagnosis not present

## 2023-05-19 DIAGNOSIS — Z825 Family history of asthma and other chronic lower respiratory diseases: Secondary | ICD-10-CM | POA: Diagnosis not present

## 2023-05-19 DIAGNOSIS — Z853 Personal history of malignant neoplasm of breast: Secondary | ICD-10-CM | POA: Diagnosis not present

## 2023-05-19 DIAGNOSIS — Z801 Family history of malignant neoplasm of trachea, bronchus and lung: Secondary | ICD-10-CM | POA: Diagnosis not present

## 2023-05-19 DIAGNOSIS — C9200 Acute myeloblastic leukemia, not having achieved remission: Secondary | ICD-10-CM | POA: Diagnosis present

## 2023-05-19 DIAGNOSIS — I2510 Atherosclerotic heart disease of native coronary artery without angina pectoris: Secondary | ICD-10-CM | POA: Diagnosis present

## 2023-05-19 DIAGNOSIS — Z923 Personal history of irradiation: Secondary | ICD-10-CM | POA: Diagnosis not present

## 2023-05-19 DIAGNOSIS — Z170 Estrogen receptor positive status [ER+]: Secondary | ICD-10-CM | POA: Diagnosis not present

## 2023-05-19 LAB — HEMOGLOBIN A1C
Hgb A1c MFr Bld: 6 % — ABNORMAL HIGH (ref 4.8–5.6)
Mean Plasma Glucose: 126 mg/dL

## 2023-05-19 LAB — CBC WITH DIFFERENTIAL/PLATELET
Abs Immature Granulocytes: 0 10*3/uL (ref 0.00–0.07)
Basophils Absolute: 0 10*3/uL (ref 0.0–0.1)
Basophils Relative: 0 %
Eosinophils Absolute: 0 10*3/uL (ref 0.0–0.5)
Eosinophils Relative: 3 %
HCT: 25.6 % — ABNORMAL LOW (ref 36.0–46.0)
Hemoglobin: 8.5 g/dL — ABNORMAL LOW (ref 12.0–15.0)
Lymphocytes Relative: 60 %
Lymphs Abs: 0.7 10*3/uL (ref 0.7–4.0)
MCH: 30.9 pg (ref 26.0–34.0)
MCHC: 33.2 g/dL (ref 30.0–36.0)
MCV: 93.1 fL (ref 80.0–100.0)
Monocytes Absolute: 0 10*3/uL — ABNORMAL LOW (ref 0.1–1.0)
Monocytes Relative: 2 %
Myelocytes: 1 %
Neutro Abs: 0.3 10*3/uL — CL (ref 1.7–7.7)
Neutrophils Relative %: 26 %
Other: 8 %
Platelets: 19 10*3/uL — CL (ref 150–400)
RBC: 2.75 MIL/uL — ABNORMAL LOW (ref 3.87–5.11)
RDW: 16.8 % — ABNORMAL HIGH (ref 11.5–15.5)
WBC: 1.2 10*3/uL — CL (ref 4.0–10.5)
nRBC: 2.6 % — ABNORMAL HIGH (ref 0.0–0.2)

## 2023-05-19 LAB — HIV ANTIBODY (ROUTINE TESTING W REFLEX): HIV Screen 4th Generation wRfx: NONREACTIVE

## 2023-05-19 LAB — BPAM RBC: Blood Product Expiration Date: 202406282359

## 2023-05-19 LAB — COMPREHENSIVE METABOLIC PANEL
ALT: 15 U/L (ref 0–44)
AST: 14 U/L — ABNORMAL LOW (ref 15–41)
Albumin: 3.3 g/dL — ABNORMAL LOW (ref 3.5–5.0)
Alkaline Phosphatase: 63 U/L (ref 38–126)
Anion gap: 7 (ref 5–15)
BUN: 16 mg/dL (ref 8–23)
CO2: 26 mmol/L (ref 22–32)
Calcium: 8.8 mg/dL — ABNORMAL LOW (ref 8.9–10.3)
Chloride: 103 mmol/L (ref 98–111)
Creatinine, Ser: 0.93 mg/dL (ref 0.44–1.00)
GFR, Estimated: >60 mL/min (ref >60)
Glucose, Bld: 101 mg/dL — ABNORMAL HIGH (ref 70–99)
Potassium: 4.2 mmol/L (ref 3.5–5.1)
Sodium: 136 mmol/L (ref 135–145)
Total Bilirubin: 1.2 mg/dL (ref 0.3–1.2)
Total Protein: 6.8 g/dL (ref 6.5–8.1)

## 2023-05-19 LAB — PROTIME-INR
INR: 1 (ref 0.8–1.2)
Prothrombin Time: 13.7 seconds (ref 11.4–15.2)

## 2023-05-19 LAB — TYPE AND SCREEN: Antibody Screen: NEGATIVE

## 2023-05-19 LAB — MAGNESIUM: Magnesium: 1.7 mg/dL (ref 1.7–2.4)

## 2023-05-19 LAB — PATHOLOGIST SMEAR REVIEW

## 2023-05-19 MED ORDER — LIDOCAINE HCL (PF) 1 % IJ SOLN
INTRAMUSCULAR | Status: AC | PRN
  Administered 2023-05-19: 10 mL

## 2023-05-19 MED ORDER — MIDAZOLAM HCL 2 MG/2ML IJ SOLN
INTRAMUSCULAR | Status: AC
  Filled 2023-05-19: qty 4

## 2023-05-19 MED ORDER — FENTANYL CITRATE (PF) 100 MCG/2ML IJ SOLN
INTRAMUSCULAR | Status: AC
  Filled 2023-05-19: qty 4

## 2023-05-19 MED ORDER — DIPHENHYDRAMINE HCL 50 MG/ML IJ SOLN
INTRAMUSCULAR | Status: AC
  Filled 2023-05-19: qty 1

## 2023-05-19 MED ORDER — FENTANYL CITRATE (PF) 100 MCG/2ML IJ SOLN
INTRAMUSCULAR | Status: AC | PRN
  Administered 2023-05-19: 50 ug via INTRAVENOUS

## 2023-05-19 MED ORDER — MORPHINE SULFATE (PF) 2 MG/ML IV SOLN
1.0000 mg | Freq: Once | INTRAVENOUS | Status: AC
  Administered 2023-05-19: 1 mg via INTRAVENOUS
  Filled 2023-05-19: qty 1

## 2023-05-19 MED ORDER — SENNOSIDES-DOCUSATE SODIUM 8.6-50 MG PO TABS
2.0000 | ORAL_TABLET | Freq: Two times a day (BID) | ORAL | Status: DC
  Administered 2023-05-19 – 2023-05-20 (×3): 2 via ORAL
  Filled 2023-05-19 (×3): qty 2

## 2023-05-19 MED ORDER — MIDAZOLAM HCL 2 MG/2ML IJ SOLN
INTRAMUSCULAR | Status: AC | PRN
  Administered 2023-05-19: 1 mg via INTRAVENOUS

## 2023-05-19 NOTE — Progress Notes (Signed)
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  I attempted to see the patient today however she was on bone marrow aspiration biopsy.  I hence had a conversation with her husband.  Given her pancytopenia and some concern for blasts on the peripheral smear we agree with bone marrow aspiration and biopsy.  I discussed the findings with Dr. [place] Callas, bone marrow aspirate reviewed by her did not show evidence of clear acute leukemia.  Immunostains were added and we have to await results.  According to Dr. [place] Callas even the peripheral blood smear was not consistent with acute leukemia.  She however suggested that we wait for additional test results before we make a diagnosis.  I have conveyed these reports to the primary team.  There appears to be no evidence of nutritional deficiency in the interim, B12 levels and ferritin levels are not low.  No evidence of hemolysis.  TSH normal.  Folic acid levels are normal. I have discussed with the primary team if we can run the test for parvo, hepatitis virus to look for other etiologies of pancytopenia.  Based on wants labs, no evidence of DIC, INR was normal and fibrinogen was normal.  I will plan to see her on Tuesday once we have more results from the bone marrow aspiration and biopsy.  In the interim we have discussed that she should be transfused to maintain a hemoglobin of 7 g/dL at least or if she is clinically symptomatic can aim for 8 g/dL and transfuse to maintain a  platelet count of 20,000 or greater in the absence of bleeding.

## 2023-05-19 NOTE — Progress Notes (Signed)
PROGRESS NOTE  Caroline [name] UJW:[id number] DOB: 06/08/1956 DOA: 05/18/2023 PCP: Teena Irani, PA-C   LOS: 1 day   Brief Narrative / Interim history: 67 y.o. female with medical history significant of breast cancer several years back s/p surgery, radiation therapy, and finished 5 years of tamoxifen, used to see Dr. Darnelle Catalan but now switched to Dr. Al Pimple, tobacco use, hypertension, hyperlipidemia, prior heavy EtOH use up until about a year and a half ago, comes to the hospital with complaints of fatigue.  She has been feeling weaker than normal for the past couple of weeks.  She saw her PCP, underwent basic workup, was found to have pancytopenia and she was directed to the emergency room.   Subjective / 24h Interval events: Slept well last night, no complaints this morning. No nausea/vomiting. No fevers / chills  Assesement and Plan: Principal problem Pancytopenia -hematology consulted, discussed with Dr. Al Pimple.  Main concerning feature along with her pancytopenia due to immature appearing cells on her peripheral smear, concerning for blasts. This may indicate acute leukemia. -CBC last evening with low hemoglobin, transfused another unit of pRBC -s/p marrow biopsy this afternoon, if preliminarily will show leukemia will need transfer to tertiary center for treatment -monitor Hb / Platelets, now at 8.5 / 19 k  Active problems Essential hypertension -currently she is normotensive. Hold home antihypertensives for now.   Hyperlipidemia -continue statin   Tobacco use -determined to quit after this hospitalization    Scheduled Meds:  oxyCODONE-acetaminophen  1 tablet Oral Q6H   And   oxyCODONE  5 mg Oral Q6H   rosuvastatin  5 mg Oral QHS   senna-docusate  2 tablet Oral BID   Continuous Infusions: PRN Meds:.  Current Outpatient Medications  Medication Instructions   Cyanocobalamin (VITAMIN B12 PO) 1 tablet, Oral, Daily   metoprolol succinate (TOPROL-XL) 25 mg, Oral, 2 times  daily, Future refills from pcp   oxyCODONE-acetaminophen (PERCOCET) 10-325 MG tablet 1 tablet, Oral, Every 6 hours   rosuvastatin (CRESTOR) 5 mg, Oral, Daily at bedtime   tamoxifen (NOLVADEX) 20 mg, Oral, Daily   valsartan-hydrochlorothiazide (DIOVAN-HCT) 160-12.5 MG tablet 1 tablet, Oral, Daily   Vitamin D (Ergocalciferol) (DRISDOL) 50,000 Units, Oral, Every Fri    Diet Orders (From admission, onward)     Start     Ordered   05/19/23 1132  Diet regular Fluid consistency: Thin  Diet effective now       Question:  Fluid consistency:  Answer:  Thin   05/19/23 1131            DVT prophylaxis: SCDs Start: 05/18/23 1626   Lab Results  Component Value Date   PLT 19 (LL) 05/19/2023      Code Status: Full Code  Family Communication: no family at bedside  Status is: Observation The patient will require care spanning > 2 midnights and should be moved to inpatient because: monitor counts, may need more transfusion, may need transfer to tertiary center   Level of care: Med-Surg  Consultants:  Oncology   Objective: Vitals:   05/19/23 1025 05/19/23 1030 05/19/23 1035 05/19/23 1040  BP:  108/73 114/76 120/67  Pulse: 86 83 85 82  Resp: 14 11 15 12   Temp:      TempSrc:      SpO2: 99% 96% 97% 99%    Intake/Output Summary (Last 24 hours) at 05/19/2023 1318 Last data filed at 05/19/2023 0820 Gross per 24 hour  Intake 716.33 ml  Output --  Net 716.33  ml   Wt Readings from Last 3 Encounters:  10/04/22 87.7 kg  08/31/21 84.9 kg  02/04/20 83.8 kg    Examination:  Constitutional: NAD Eyes: no scleral icterus ENMT: Mucous membranes are moist.  Neck: normal, supple Respiratory: clear to auscultation bilaterally, no wheezing, no crackles. Normal respiratory effort. No accessory muscle use.  Cardiovascular: Regular rate and rhythm, no murmurs / rubs / gallops. No LE edema.  Abdomen: non distended, no tenderness. Bowel sounds positive.  Musculoskeletal: no clubbing /  cyanosis.   Data Reviewed: I have independently reviewed following labs and imaging studies   CBC Recent Labs  Lab 05/18/23 1700 05/19/23 0855  WBC 1.2* 1.2*  HGB 6.8* 8.5*  HCT 20.5* 25.6*  PLT 20* 19*  MCV 92.8 93.1  MCH 30.8 30.9  MCHC 33.2 33.2  RDW 18.1* 16.8*  LYMPHSABS 0.8 0.7  MONOABS 0.1 0.0*  EOSABS 0.0 0.0  BASOSABS 0.0 0.0    Recent Labs  Lab 05/18/23 1700 05/19/23 0855  NA 135 136  K 4.5 4.2  CL 105 103  CO2 23 26  GLUCOSE 96 101*  BUN 18 16  CREATININE 0.95 0.93  CALCIUM 8.8* 8.8*  AST 12* 14*  ALT 14 15  ALKPHOS 59 63  BILITOT 1.3* 1.2  ALBUMIN 3.2* 3.3*  MG 1.5* 1.7  INR  --  1.0  TSH 1.261  --   HGBA1C 6.0*  --     ------------------------------------------------------------------------------------------------------------------ No results for input(s): "CHOL", "HDL", "LDLCALC", "TRIG", "CHOLHDL", "LDLDIRECT" in the last 72 hours.  Lab Results  Component Value Date   HGBA1C 6.0 (H) 05/18/2023   ------------------------------------------------------------------------------------------------------------------ Recent Labs    05/18/23 1700  TSH 1.261    Cardiac Enzymes No results for input(s): "CKMB", "TROPONINI", "MYOGLOBIN" in the last 168 hours.  Invalid input(s): "CK" ------------------------------------------------------------------------------------------------------------------ No results found for: "BNP"  CBG: No results for input(s): "GLUCAP" in the last 168 hours.  No results found for this or any previous visit (from the past 240 hour(s)).   Radiology Studies: CT BONE MARROW BIOPSY  Result Date: 05/19/2023 INDICATION: [id number] Pancytopenia (HCC) [phone] EXAM: CT GUIDED BONE MARROW ASPIRATION AND CORE BIOPSY MEDICATIONS: 25 mg Benadryl IV ANESTHESIA/SEDATION: Moderate (conscious) sedation was employed during this procedure. A total of Versed 3 mg and Fentanyl 150 mcg was administered intravenously. Moderate Sedation Time:  15 minutes. The patient's level of consciousness and vital signs were monitored continuously by radiology nursing throughout the procedure under my direct supervision. FLUOROSCOPY TIME:  CT dose in mGy was not provided. COMPLICATIONS: None immediate. Estimated blood loss: <5 mL PROCEDURE: RADIATION DOSE REDUCTION: This exam was performed according to the departmental dose-optimization program which includes automated exposure control, adjustment of the mA and/or kV according to patient size and/or use of iterative reconstruction technique. Informed written consent was obtained from the patient after a thorough discussion of the procedural risks, benefits and alternatives. All questions were addressed. Maximal Sterile Barrier Technique was utilized including caps, mask, sterile gowns, sterile gloves, sterile drape, hand hygiene and skin antiseptic. A timeout was performed prior to the initiation of the procedure. The patient was positioned prone and non-contrast localization CT was performed of the pelvis to demonstrate the iliac marrow spaces. Maximal barrier sterile technique utilized including caps, mask, sterile gowns, sterile gloves, large sterile drape, hand hygiene, and chlorhexidine prep. Under sterile conditions and local anesthesia, an 11 gauge coaxial bone biopsy needle was advanced into the RIGHT iliac marrow space. Needle position was confirmed with CT imaging. Initially,  bone marrow aspiration was performed. Next, the 11 gauge outer cannula was utilized to obtain a 2 iliac bone marrow core biopsy. Needle was removed. Hemostasis was obtained with compression. The patient tolerated the procedure well. Samples were prepared with the cytotechnologist. IMPRESSION: Successful CT-guided bone marrow aspiration and biopsy. Roanna Banning, MD [facility] Electronically Signed   By: Roanna Banning M.D.   On: 05/19/2023 12:01     Caroline Pert, MD,  PhD [facility]  Between 7 am - 7 pm I am available, please contact me via Amion (for emergencies) or Securechat (non urgent messages)  Between 7 pm - 7 am I am not available, please contact night coverage MD/APP via Amion

## 2023-05-19 NOTE — Consult Note (Signed)
Chief Complaint: Patient was seen in consultation today for  CT guided bone marrow biopsy  Referring Physician(s): Iruku,P  Supervising Physician: Roanna Banning  Patient Status: [facility] - In-pt  History of Present Illness: Caroline [name] is a 67 y.o. female with PMH sig for arthritis /prior left hip arthroplasty 2018, left breast cancer 2018 status post lumpectomy and radioactive seed implant, nephrolithiasis, hypertension, paroxysmal atrial fibrillation , tobacco use, depression, hypertension, hyperlipidemia, prior heavy alcohol use who was admitted to [facility] on 5/23 with weakness/fatigue and findings of pancytopenia on recent blood work from PCP.  Request now received for CT-guided bone marrow biopsy for further evaluation.  Past Medical History:  Diagnosis Date   Arthritis    hips   Breast cancer (HCC)    Cancer (HCC)    Left breast   Family history of breast cancer    Family history of lung cancer    Family history of melanoma    History of kidney stones    Hypertension    Paroxysmal atrial fibrillation [facility])    Personal history of radiation therapy    S/P radiation therapy 10/10/17- 11/22/17   Right Breast/ 50 gy in 25 fractions, Right Breast boost/ 10 Gy in 5 fractions.    Past Surgical History:  Procedure Laterality Date   BREAST BIOPSY Right 08/02/2017   BREAST LUMPECTOMY Right 08/17/2017   BREAST LUMPECTOMY WITH RADIOACTIVE SEED AND SENTINEL LYMPH NODE BIOPSY Right 08/17/2017   Procedure: RIGHT BREAST RADIOACTIVE SEED LOCALIZED LUMPECTOMY AND SENTINEL LYMPH NODE BIOPSY;  Surgeon: Harriette Bouillon, MD;  Location: [facility];  Service: General;  Laterality: Right;   CESAREAN SECTION  [phone]   COLONOSCOPY     HEMORRHOID SURGERY     20 years ago   RE-EXCISION OF BREAST LUMPECTOMY Right 09/06/2017   Procedure: RE-EXCISION OF RIGHT BREAST LUMPECTOMY;  Surgeon: Harriette Bouillon, MD;  Location: [facility];  Service:  General;  Laterality: Right;   TOE SURGERY Right    5th 20 years ago   TOTAL HIP ARTHROPLASTY Left 12/01/2017   Procedure: LEFT TOTAL HIP ARTHROPLASTY ANTERIOR APPROACH;  Surgeon: Kathryne Hitch, MD;  Location: [facility];  Service: Orthopedics;  Laterality: Left;    Allergies: Sulfa antibiotics  Medications: Prior to Admission medications   Medication Sig Start Date End Date Taking? Authorizing Provider  Cyanocobalamin (VITAMIN B12 PO) Take 1 tablet by mouth daily.   Yes [provider]  metoprolol succinate (TOPROL-XL) 25 MG 24 hr tablet Take 1 tablet (25 mg total) by mouth in the morning and at bedtime. Future refills from pcp Patient taking differently: Take 25 mg by mouth daily. 01/22/21  Yes Newman Nip, NP  oxyCODONE-acetaminophen (PERCOCET) 10-325 MG tablet Take 1 tablet by mouth every 6 (six) hours.   Yes [provider]  rosuvastatin (CRESTOR) 5 MG tablet Take 5 mg by mouth at bedtime.   Yes [provider]  valsartan-hydrochlorothiazide (DIOVAN-HCT) 160-12.5 MG tablet Take 1 tablet by mouth daily. 09/15/16  Yes [provider]  Vitamin D, Ergocalciferol, (DRISDOL) 1.25 MG (50000 UNIT) CAPS capsule Take 50,000 Units by mouth every Friday.   Yes [provider]  tamoxifen (NOLVADEX) 20 MG tablet Take 1 tablet (20 mg total) by mouth daily. Patient not taking: Reported on 05/18/2023 02/28/22   Rachel Moulds, MD     Family History  Problem Relation Age of Onset   Breast cancer Cousin        dx. in  her 21s (paternal first cousin)   Emphysema Mother    Dementia Father    Melanoma Brother        dx. in his 53s or 61s   Lung cancer Paternal Aunt        dx. late 12s, smoker    Social History   Socioeconomic History   Marital status: Married    Spouse name: Not on file   Number of children: Not on file   Years of education: Not on file   Highest education level: Not on file  Occupational History   Not on file  Tobacco  Use   Smoking status: Every Day    Packs/day: 0.50    Years: 40.00    Additional pack years: 0.00    Total pack years: 20.00    Types: Cigarettes   Smokeless tobacco: Never  Vaping Use   Vaping Use: Never used  Substance and Sexual Activity   Alcohol use: Yes    Alcohol/week: 7.0 standard drinks of alcohol    Types: 7 Glasses of wine per week    Comment: wine nightly   Drug use: No   Sexual activity: Yes    Birth control/protection: Post-menopausal  Other Topics Concern   Not on file  Social History Narrative   Not on file   Social Determinants of Health   Financial Resource Strain: Not on file  Food Insecurity: No Food Insecurity (05/18/2023)   Hunger Vital Sign    Worried About Running Out of Food in the Last Year: Never true    Ran Out of Food in the Last Year: Never true  Transportation Needs: No Transportation Needs (05/18/2023)   PRAPARE - Administrator, Civil Service (Medical): No    Lack of Transportation (Non-Medical): No  Physical Activity: Not on file  Stress: Not on file  Social Connections: Not on file      Review of Systems denies fever, CP, dyspnea, cough, abd/back pain,N/V or bleeding; she does have HA,left hip pain  Vital Signs: BP 120/71   Pulse 80   Temp 98.4 F (36.9 C) (Oral)   Resp 18   LMP 11/25/2010   SpO2 96%     Physical Exam: awake/alert; chest- CTA bilat; heart- RRR; abd- soft,+BS,NT; no LE edema  Imaging: No results found.  Labs:  CBC: Recent Labs    10/04/22 1315 05/18/23 1700  WBC 5.7 1.2*  HGB 12.5 6.8*  HCT 37.3 20.5*  PLT 154 20*    COAGS: No results for input(s): "INR", "APTT" in the last 8760 hours.  BMP: Recent Labs    10/04/22 1315 05/18/23 1700  NA 138 135  K 4.3 4.5  CL 102 105  CO2 33* 23  GLUCOSE 176* 96  BUN 24* 18  CALCIUM 9.1 8.8*  CREATININE 0.97 0.95  GFRNONAA >60 >60    LIVER FUNCTION TESTS: Recent Labs    10/04/22 1315 05/18/23 1700  BILITOT 0.5 1.3*  AST 12* 12*   ALT 10 14  ALKPHOS 71 59  PROT 7.1 6.7  ALBUMIN 3.9 3.2*    TUMOR MARKERS: No results for input(s): "AFPTM", "CEA", "CA199", "CHROMGRNA" in the last 8760 hours.  Assessment and Plan: 67 y.o. female with PMH sig for arthritis /prior left hip arthroplasty 2018, left breast cancer 2018 status post lumpectomy and radioactive seed implant, nephrolithiasis, hypertension, paroxysmal atrial fibrillation , tobacco use, depression, hypertension, hyperlipidemia, prior heavy alcohol use who was admitted to [facility] on 5/23 with  weakness/fatigue and findings of pancytopenia on recent blood work from PCP.  Request now received for CT-guided bone marrow biopsy for further evaluation.Risks and benefits of procedure was discussed with the patient/spouse  including, but not limited to bleeding, infection, damage to adjacent structures or low yield requiring additional tests.  All of the questions were answered and there is agreement to proceed.  Consent signed and in chart. Procedure scheduled for this morning.     Thank you for this interesting consult.  I greatly enjoyed meeting Caroline [name] and look forward to participating in their care.  A copy of this report was sent to the requesting provider on this date.  Electronically Signed: D. Jeananne Rama, PA-C 05/19/2023, 9:12 AM   I spent a total of  20 minutes   in face to face in clinical consultation, greater than 50% of which was counseling/coordinating care for CT guided bone marrow biopsy

## 2023-05-19 NOTE — Procedures (Signed)
Vascular and Interventional Radiology Procedure Note  Patient: Caroline [name] DOB: 1956/11/01 Medical Record Number: [id number] Note Date/Time: 05/19/23 10:18 AM   Performing Physician: Roanna Banning, MD Assistant(s): None  Diagnosis: Pancytopenia  Procedure: BONE MARROW ASPIRATION and BIOPSY  Anesthesia: Conscious Sedation Complications: None Estimated Blood Loss: Minimal Specimens: Sent for Pathology  Findings:  Successful CT-guided bone marrow aspiration and biopsy A total of 2 cores were obtained. Hemostasis of the tract was achieved using Manual Pressure.  Plan: Bed rest for 1 hours.  See detailed procedure note with images in PACS. The patient tolerated the procedure well without incident or complication and was returned to Floor Bed in stable condition.    Roanna Banning, MD Vascular and Interventional Radiology Specialists [facility] Radiology   Pager. [phone] Clinic. [phone]

## 2023-05-20 DIAGNOSIS — D61818 Other pancytopenia: Secondary | ICD-10-CM | POA: Diagnosis not present

## 2023-05-20 LAB — CBC WITH DIFFERENTIAL/PLATELET
Basophils Absolute: 0 10*3/uL (ref 0.0–0.1)
HCT: 27.3 % — ABNORMAL LOW (ref 36.0–46.0)
Monocytes Absolute: 0 10*3/uL — ABNORMAL LOW (ref 0.1–1.0)
Neutro Abs: 0.3 10*3/uL — CL (ref 1.7–7.7)
Neutrophils Relative %: 23 %

## 2023-05-20 LAB — HEPATITIS PANEL, ACUTE
HCV Ab: NONREACTIVE
Hep A IgM: NONREACTIVE
Hep B C IgM: NONREACTIVE
Hepatitis B Surface Ag: NONREACTIVE

## 2023-05-20 NOTE — Discharge Summary (Signed)
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 female with medical history significant of breast cancer several years back s/p surgery, radiation therapy, and finished 5 years of tamoxifen, used to see Dr. Darnelle Catalan but now switched to Dr. Al Pimple, tobacco use, hypertension, hyperlipidemia, prior heavy EtOH use up until about a year and a half ago, comes to the hospital with complaints of fatigue.  She has been feeling weaker than normal for the past couple of weeks.  She saw her PCP, underwent basic workup, was found to have pancytopenia and she was directed to the emergency room.  She denies any fever or chills but does complain of feeling like she is running a low-grade temp several times in the last few weeks.  There is no abdominal pain, no nausea or vomiting.  There is no weight loss.  There is no diarrhea, she has not seen any blood in her stools. ED Course: Workup in the ED showed PT 14.7, INR 1.1, fibrinogen normal at 467.  Peripheral smear showed marked normocytic, normochromic anemia, thrombocytopenia and leukopenia with absolute neutropenia.  Rare atypical immature appearing cells suspicious for blasts are identified. White count was 1.2 hemoglobin 6.7, platelets were 27.  MCV was 94.7.  Reticulocytes were 1.38, immature reticulocytes were elevated at 19.1.  ANC was 0.25. She underwent a CT chest abdomen pelvis with IV contrast which showed hepatosplenomegaly,  nonspecific perinephric stranding of the kidneys without hydronephrosis.  Chest was fairly unremarkable except for coronary artery disease.  There was no PE identified.  Musculoskeletal did not show any acute osseous findings   Hospital Course / Discharge diagnoses: Principal problem AML, pancytopenia -patient was admitted to the hospital with pancytopenia.  Hematology consulted and evaluated patient while hospitalized.  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  Given this diagnosis, recommendations were for her to be transferred to a tertiary center.  Case was discussed with Dr. Lowell Guitar at [facility], she was accepted for transfer.  She is stable at the time of discharge, afebrile, normotensive and satting well on room air.  Blood work this morning shows a WBC of 1.4 with ANC 0.3, hemoglobin 8.9 and platelets 19.  She has no bleeding and had a normal bowel movement this morning.  Patient received total of 2 units of packed red blood cells, 1 in the ED and 1 unit after admission for hemoglobin of 6.8.  She has been afebrile   Active problems Essential hypertension -currently she is normotensive. Hold home antihypertensives for now. Hyperlipidemia -continue statin Tobacco use -determined to quit after this hospitalization Alcohol use, in remission -has not had anything to drink for 1-1/2 years  Sepsis ruled out  Discharge Instructions  Allergies as of 05/20/2023       Reactions   Sulfa Antibiotics  Other (See Comments)   The patient was told she was allergic (as a child)        Medication List     TAKE these medications    metoprolol succinate 25 MG 24 hr tablet Commonly known as: TOPROL-XL Take 1 tablet (25 mg total) by mouth in the morning and at bedtime. Future refills from pcp What changed:  when to take this additional instructions   oxyCODONE-acetaminophen 10-325 MG  tablet Commonly known as: PERCOCET Take 1 tablet by mouth every 6 (six) hours.   rosuvastatin 5 MG tablet Commonly known as: CRESTOR Take 5 mg by mouth at bedtime.   tamoxifen 20 MG tablet Commonly known as: NOLVADEX Take 1 tablet (20 mg total) by mouth daily.   valsartan-hydrochlorothiazide 160-12.5 MG tablet Commonly known as: DIOVAN-HCT Take 1 tablet by mouth daily.   VITAMIN B12 PO Take 1 tablet by mouth daily.   Vitamin D (Ergocalciferol) 1.25 MG (50000 UNIT) Caps capsule Commonly known as: DRISDOL Take 50,000 Units by mouth every Friday.       Consultations: Oncology  Procedures/Studies:  CT BONE MARROW BIOPSY  Result Date: 05/19/2023 INDICATION: [id number] Pancytopenia (HCC) [id number] EXAM: CT GUIDED BONE MARROW ASPIRATION AND CORE BIOPSY MEDICATIONS: 25 mg Benadryl IV ANESTHESIA/SEDATION: Moderate (conscious) sedation was employed during this procedure. A total of Versed 3 mg and Fentanyl 150 mcg was administered intravenously. Moderate Sedation Time: 15 minutes. The patient's level of consciousness and vital signs were monitored continuously by radiology nursing throughout the procedure under my direct supervision. FLUOROSCOPY TIME:  CT dose in mGy was not provided. COMPLICATIONS: None immediate. Estimated blood loss: <5 mL PROCEDURE: RADIATION DOSE REDUCTION: This exam was performed according to the departmental dose-optimization program which includes automated exposure control, adjustment of the mA and/or kV according to patient size and/or use of iterative reconstruction technique. Informed written consent was obtained from the patient after a thorough discussion of the procedural risks, benefits and alternatives. All questions were addressed. Maximal Sterile Barrier Technique was utilized including caps, mask, sterile gowns, sterile gloves, sterile drape, hand hygiene and skin antiseptic. A timeout was performed prior to the initiation of the procedure. The patient was  positioned prone and non-contrast localization CT was performed of the pelvis to demonstrate the iliac marrow spaces. Maximal barrier sterile technique utilized including caps, mask, sterile gowns, sterile gloves, large sterile drape, hand hygiene, and chlorhexidine prep. Under sterile conditions and local anesthesia, an 11 gauge coaxial bone biopsy needle was advanced into the RIGHT iliac marrow space. Needle position was confirmed with CT imaging. Initially, bone marrow aspiration was performed. Next, the 11 gauge outer cannula was utilized to obtain a 2 iliac bone marrow core biopsy. Needle was removed. Hemostasis was obtained with compression. The patient tolerated the procedure well. Samples were prepared with the cytotechnologist. IMPRESSION: Successful CT-guided bone marrow aspiration and biopsy. Roanna Banning, MD Vascular and Interventional Radiology Specialists [facility] Radiology Electronically Signed   By: Roanna Banning M.D.   On: 05/19/2023 12:01     Subjective: - no chest pain, shortness of breath, no abdominal pain, nausea or vomiting.   Discharge Exam: BP 115/79 (BP Location: Left Arm)   Pulse 86   Temp 98.5 F (36.9 C) (Oral)   Resp 18   LMP 11/25/2010   SpO2 97%   General: Pt is alert, awake, not in acute distress Cardiovascular: RRR, S1/S2 +, no rubs, no gallops Respiratory: CTA bilaterally, no wheezing, no rhonchi Abdominal: Soft, NT, ND, bowel sounds + Extremities:  no edema, no cyanosis    The results of significant diagnostics from this hospitalization (including imaging, microbiology, ancillary and laboratory) are listed below for reference.     Microbiology: No results found for this or any previous visit (from the past 240 hour(s)).   Labs: Basic Metabolic Panel: Recent Labs  Lab 05/18/23 1700 05/19/23 0855  NA 135 136  K 4.5 4.2  CL 105 103  CO2 23 26  GLUCOSE 96 101*  BUN 18 16  CREATININE 0.95 0.93  CALCIUM 8.8* 8.8*  MG 1.5* 1.7   Liver Function  Tests: Recent Labs  Lab 05/18/23 1700 05/19/23 0855  AST 12* 14*  ALT 14 15  ALKPHOS 59 63  BILITOT 1.3* 1.2  PROT 6.7 6.8  ALBUMIN 3.2* 3.3*   CBC: Recent Labs  Lab 05/18/23 1700 05/19/23 0855 05/20/23 0738  WBC 1.2* 1.2* 1.4*  NEUTROABS 0.3* 0.3* 0.3*  HGB 6.8* 8.5* 8.9*  HCT 20.5* 25.6* 27.3*  MCV 92.8 93.1 95.5  PLT 20* 19* 19*   CBG: No results for input(s): "GLUCAP" in the last 168 hours. Hgb A1c Recent Labs    05/18/23 1700  HGBA1C 6.0*   Lipid Profile No results for input(s): "CHOL", "HDL", "LDLCALC", "TRIG", "CHOLHDL", "LDLDIRECT" in the last 72 hours. Thyroid function studies Recent Labs    05/18/23 1700  TSH 1.261   Urinalysis No results found for: "COLORURINE", "APPEARANCEUR", "LABSPEC", "PHURINE", "GLUCOSEU", "HGBUR", "BILIRUBINUR", "KETONESUR", "PROTEINUR", "UROBILINOGEN", "NITRITE", "LEUKOCYTESUR"  FURTHER DISCHARGE INSTRUCTIONS:   Get Medicines reviewed and adjusted: Please take all your medications with you for your next visit with your Primary MD   Laboratory/radiological data: Please request your Primary MD to go over all hospital tests and procedure/radiological results at the follow up, please ask your Primary MD to get all Hospital records sent to his/her office.   In some cases, they will be blood work, cultures and biopsy results pending at the time of your discharge. Please request that your primary care M.D. goes through all the records of your hospital data and follows up on these results.   Also Note the following: If you experience worsening of your admission symptoms, develop shortness of breath, life threatening emergency, suicidal or homicidal thoughts you must seek medical attention immediately by calling 911 or calling your MD immediately  if symptoms less severe.   You must read complete instructions/literature along with all the possible adverse reactions/side effects for all the Medicines you take and that have been  prescribed to you. Take any new Medicines after you have completely understood and accpet all the possible adverse reactions/side effects.    Do not drive when taking Pain medications or sleeping medications (Benzodaizepines)   Do not take more than prescribed Pain, Sleep and Anxiety Medications. It is not advisable to combine anxiety,sleep and pain medications without talking with your primary care practitioner   Special Instructions: If you have smoked or chewed Tobacco  in the last 2 yrs please stop smoking, stop any regular Alcohol  and or any Recreational drug use.   Wear Seat belts while driving.   Please note: You were cared for by a hospitalist during your hospital stay. Once you are discharged, your primary care physician will handle any further medical issues. Please note that NO REFILLS for any discharge medications will be authorized once you are discharged, as it is imperative that you return to your primary care physician (or establish a relationship with a primary care physician if you do not have  one) for your post hospital discharge needs so that they can reassess your need for medications and monitor your lab values.  Time coordinating discharge: 40 minutes  SIGNED:  Pamella Pert, MD, PhD 05/20/2023, 10:33 AM

## 2023-05-20 NOTE — Plan of Care (Addendum)
Report called to room 614 and RN spoke to Clifton Custard, Charity fundraiser at [facility]. Sarah-Admitter to set up transportation for patient. Patient is alert and oriented X4, on room air, no pain noted, husband at bedside awaiting transportation.   1425: [facility] Transportation arrived to take patient to [facility]. Patient discharged. Problem: Education: Goal: Knowledge of General Education information will improve Description: Including pain rating scale, medication(s)/side effects and non-pharmacologic comfort measures Outcome: Completed/Met   Problem: Health Behavior/Discharge Planning: Goal: Ability to manage health-related needs will improve Outcome: Completed/Met   Problem: Clinical Measurements: Goal: Ability to maintain clinical measurements within normal limits will improve Outcome: Completed/Met Goal: Will remain free from infection Outcome: Completed/Met Goal: Diagnostic test results will improve Outcome: Completed/Met Goal: Respiratory complications will improve Outcome: Completed/Met Goal: Cardiovascular complication will be avoided Outcome: Completed/Met   Problem: Activity: Goal: Risk for activity intolerance will decrease Outcome: Completed/Met   Problem: Nutrition: Goal: Adequate nutrition will be maintained Outcome: Completed/Met   Problem: Coping: Goal: Level of anxiety will decrease Outcome: Completed/Met   Problem: Elimination: Goal: Will not experience complications related to bowel motility Outcome: Completed/Met Goal: Will not experience complications related to urinary retention Outcome: Completed/Met   Problem: Pain Managment: Goal: General experience of comfort will improve Outcome: Completed/Met   Problem: Safety: Goal: Ability to remain free from injury will improve Outcome: Completed/Met   Problem: Skin Integrity: Goal: Risk for impaired skin integrity will decrease Outcome: Completed/Met

## 2023-05-20 NOTE — Progress Notes (Addendum)
Report called to room 614 and RN spoke to Clifton Custard, Charity fundraiser at [facility]. Sarah-Admitter to set up transportation for patient. Patient is alert and oriented X4, on room air, no pain noted, husband at bedside awaiting transportation.  1425: [facility] arrived to take patient to [facility]. Patient discharged.

## 2023-05-21 LAB — CBC WITH DIFFERENTIAL/PLATELET
Abs Immature Granulocytes: 0 10*3/uL (ref 0.00–0.07)
Basophils Relative: 2 %
Blasts: 10 %
Eosinophils Absolute: 0 10*3/uL (ref 0.0–0.5)
Eosinophils Relative: 1 %
Hemoglobin: 8.9 g/dL — ABNORMAL LOW (ref 12.0–15.0)
Lymphocytes Relative: 61 %
Lymphs Abs: 0.9 10*3/uL (ref 0.7–4.0)
MCH: 31.1 pg (ref 26.0–34.0)
MCHC: 32.6 g/dL (ref 30.0–36.0)
MCV: 95.5 fL (ref 80.0–100.0)
Monocytes Relative: 1 %
Myelocytes: 2 %
Platelets: 19 10*3/uL — CL (ref 150–400)
RBC: 2.86 MIL/uL — ABNORMAL LOW (ref 3.87–5.11)
RDW: 16.8 % — ABNORMAL HIGH (ref 11.5–15.5)
WBC: 1.4 10*3/uL — CL (ref 4.0–10.5)
nRBC: 2 /100 WBC — ABNORMAL HIGH
nRBC: 3.6 % — ABNORMAL HIGH (ref 0.0–0.2)

## 2023-05-21 LAB — CMV IGM: CMV IgM: <30 AU/mL (ref 0.0–29.9)

## 2023-05-21 LAB — BPAM RBC: ISSUE DATE / TIME: 202405240328

## 2023-05-21 LAB — EPSTEIN-BARR VIRUS (EBV) ANTIBODY PROFILE
EBV NA IgG: 137 U/mL — ABNORMAL HIGH (ref 0.0–17.9)
EBV VCA IgG: 473 U/mL — ABNORMAL HIGH (ref 0.0–17.9)
EBV VCA IgM: <36 U/mL (ref 0.0–35.9)

## 2023-05-21 LAB — TYPE AND SCREEN

## 2023-05-23 LAB — PARVOVIRUS B19 ANTIBODY, IGG AND IGM
Parovirus B19 IgG Abs: 6.9 index — ABNORMAL HIGH (ref 0.0–0.8)
Parovirus B19 IgM Abs: 0.2 index (ref 0.0–0.8)

## 2023-05-23 LAB — ANTINUCLEAR ANTIBODIES, IFA: ANA Ab, IFA: POSITIVE — AB

## 2023-05-23 LAB — SURGICAL PATHOLOGY

## 2023-05-23 LAB — FANA STAINING PATTERNS: Speckled Pattern: 24529

## 2023-05-24 LAB — ZINC: Zinc: 78 ug/dL (ref 44–115)

## 2023-05-24 LAB — COPPER, SERUM: Copper: 222 ug/dL — ABNORMAL HIGH (ref 80–158)

## 2023-05-24 LAB — SURGICAL PATHOLOGY

## 2023-05-26 ENCOUNTER — Telehealth: Payer: Self-pay | Admitting: *Deleted

## 2023-05-26 NOTE — Telephone Encounter (Signed)
This RN received call from [facility] Nurse Navigator at [facility] stating this pt of Dr Al Pimple who is being treated for AML will be discharged 05/31/2023 and will need labs on 6/7.  Appt scheduled for labs per this call for 06/02/2023 per flush per PICC line. Xray of PICC line placement pasted to speciality comments on demographic page.  Neysa Bonito will fax lab dates/ request and protocol for possible transfusion to this POD.

## 2023-05-30 ENCOUNTER — Encounter (HOSPITAL_COMMUNITY): Payer: Self-pay | Admitting: Internal Medicine

## 2023-06-02 ENCOUNTER — Inpatient Hospital Stay: Payer: BC Managed Care – PPO

## 2023-06-05 ENCOUNTER — Encounter (HOSPITAL_COMMUNITY): Payer: Self-pay | Admitting: Internal Medicine

## 2023-08-10 ENCOUNTER — Other Ambulatory Visit (HOSPITAL_COMMUNITY): Payer: Self-pay

## 2023-08-10 ENCOUNTER — Other Ambulatory Visit: Payer: Self-pay

## 2023-08-10 MED ORDER — CRESEMBA 186 MG PO CAPS: ORAL_CAPSULE | ORAL | 5 refills | Status: DC

## 2023-08-10 MED ORDER — VENCLEXTA 100 MG PO TABS
ORAL_TABLET | ORAL | 11 refills | Status: DC
  Filled 2023-08-11: qty 120, 30d supply, fill #0

## 2023-08-11 ENCOUNTER — Other Ambulatory Visit: Payer: Self-pay

## 2023-08-21 ENCOUNTER — Other Ambulatory Visit: Payer: Self-pay

## 2023-08-21 ENCOUNTER — Other Ambulatory Visit (HOSPITAL_COMMUNITY): Payer: Self-pay

## 2023-09-16 ENCOUNTER — Encounter (HOSPITAL_COMMUNITY): Payer: Self-pay

## 2023-10-05 ENCOUNTER — Ambulatory Visit: Payer: BC Managed Care – PPO | Admitting: Hematology and Oncology

## 2023-10-09 ENCOUNTER — Institutional Professional Consult (permissible substitution) (HOSPITAL_BASED_OUTPATIENT_CLINIC_OR_DEPARTMENT_OTHER): Payer: BC Managed Care – PPO | Admitting: Pulmonary Disease

## 2023-12-18 ENCOUNTER — Institutional Professional Consult (permissible substitution) (HOSPITAL_BASED_OUTPATIENT_CLINIC_OR_DEPARTMENT_OTHER): Payer: Medicare Other | Admitting: Pulmonary Disease

## 2024-02-24 DEATH — deceased
